# Patient Record
Sex: Male | Born: 1935 | Race: White | Hispanic: No | Marital: Single | Smoking: Former smoker
Health system: Southern US, Community
[De-identification: ages and names within clinical notes are randomized; demographics above are authoritative.]

## PROBLEM LIST (undated history)

## (undated) DIAGNOSIS — T7840XA Allergy, unspecified, initial encounter: Secondary | ICD-10-CM

## (undated) DIAGNOSIS — M40209 Unspecified kyphosis, site unspecified: Secondary | ICD-10-CM

## (undated) DIAGNOSIS — M4310 Spondylolisthesis, site unspecified: Secondary | ICD-10-CM

## (undated) DIAGNOSIS — F32A Depression, unspecified: Secondary | ICD-10-CM

## (undated) DIAGNOSIS — F329 Major depressive disorder, single episode, unspecified: Secondary | ICD-10-CM

## (undated) DIAGNOSIS — I10 Essential (primary) hypertension: Secondary | ICD-10-CM

## (undated) DIAGNOSIS — H9319 Tinnitus, unspecified ear: Secondary | ICD-10-CM

## (undated) DIAGNOSIS — E079 Disorder of thyroid, unspecified: Secondary | ICD-10-CM

## (undated) DIAGNOSIS — M1990 Unspecified osteoarthritis, unspecified site: Secondary | ICD-10-CM

## (undated) HISTORY — DX: Major depressive disorder, single episode, unspecified: F32.9

## (undated) HISTORY — DX: Unspecified kyphosis, site unspecified: M40.209

## (undated) HISTORY — DX: Unspecified osteoarthritis, unspecified site: M19.90

## (undated) HISTORY — PX: HERNIA REPAIR: SHX51

## (undated) HISTORY — DX: Spondylolisthesis, site unspecified: M43.10

## (undated) HISTORY — DX: Essential (primary) hypertension: I10

## (undated) HISTORY — DX: Allergy, unspecified, initial encounter: T78.40XA

## (undated) HISTORY — DX: Depression, unspecified: F32.A

## (undated) HISTORY — DX: Tinnitus, unspecified ear: H93.19

## (undated) HISTORY — DX: Disorder of thyroid, unspecified: E07.9

---

## 2004-03-09 ENCOUNTER — Encounter: Admission: RE | Admit: 2004-03-09 | Discharge: 2004-03-09 | Payer: Self-pay | Admitting: Family Medicine

## 2004-05-04 ENCOUNTER — Encounter: Admission: RE | Admit: 2004-05-04 | Discharge: 2004-05-04 | Payer: Self-pay | Admitting: Family Medicine

## 2004-05-06 ENCOUNTER — Encounter: Admission: RE | Admit: 2004-05-06 | Discharge: 2004-05-06 | Payer: Self-pay | Admitting: Family Medicine

## 2005-09-25 ENCOUNTER — Encounter: Admission: RE | Admit: 2005-09-25 | Discharge: 2005-09-25 | Payer: Self-pay | Admitting: Family Medicine

## 2012-01-21 ENCOUNTER — Other Ambulatory Visit: Payer: Self-pay | Admitting: Internal Medicine

## 2012-01-21 NOTE — Telephone Encounter (Signed)
PLEASE PULL CHART 

## 2012-01-22 ENCOUNTER — Other Ambulatory Visit: Payer: Self-pay | Admitting: Physician Assistant

## 2012-01-22 ENCOUNTER — Telehealth: Payer: Self-pay

## 2012-01-22 MED ORDER — TRAMADOL HCL 50 MG PO TABS: 50.0000 mg | ORAL_TABLET | Freq: Three times a day (TID) | ORAL | Status: AC | PRN

## 2012-01-22 NOTE — Telephone Encounter (Signed)
.  [facility]    PT'S WIFE STATES [facility])HAS FAXED OVER REQUEST X 2 FOR PT'S TRAMADOL, NO RESPONSE    BEST PHONE CELL # [phone]

## 2012-01-23 ENCOUNTER — Other Ambulatory Visit: Payer: Self-pay | Admitting: Internal Medicine

## 2012-01-23 NOTE — Telephone Encounter (Signed)
Med sent to pharmacy 2/12

## 2012-01-25 ENCOUNTER — Other Ambulatory Visit: Payer: Self-pay | Admitting: Physician Assistant

## 2012-01-25 MED ORDER — LIDOCAINE 5 % EX PTCH: 1.0000 | MEDICATED_PATCH | CUTANEOUS | Status: AC

## 2012-03-16 ENCOUNTER — Other Ambulatory Visit: Payer: Self-pay | Admitting: Internal Medicine

## 2012-04-09 ENCOUNTER — Other Ambulatory Visit: Payer: Self-pay | Admitting: Internal Medicine

## 2012-04-21 ENCOUNTER — Other Ambulatory Visit: Payer: Self-pay | Admitting: Internal Medicine

## 2012-06-16 ENCOUNTER — Encounter: Payer: Self-pay | Admitting: Internal Medicine

## 2012-06-16 ENCOUNTER — Other Ambulatory Visit: Payer: Self-pay | Admitting: Internal Medicine

## 2012-06-16 ENCOUNTER — Ambulatory Visit (INDEPENDENT_AMBULATORY_CARE_PROVIDER_SITE_OTHER): Payer: Medicare Other | Admitting: Internal Medicine

## 2012-06-16 VITALS — BP 142/82 | HR 61 | Temp 97.0°F | Resp 16 | Ht 71.0 in | Wt 176.7 lb

## 2012-06-16 DIAGNOSIS — Z79899 Other long term (current) drug therapy: Secondary | ICD-10-CM

## 2012-06-16 DIAGNOSIS — M549 Dorsalgia, unspecified: Secondary | ICD-10-CM

## 2012-06-16 DIAGNOSIS — I10 Essential (primary) hypertension: Secondary | ICD-10-CM

## 2012-06-16 DIAGNOSIS — E039 Hypothyroidism, unspecified: Secondary | ICD-10-CM | POA: Insufficient documentation

## 2012-06-16 DIAGNOSIS — Z0000 Encounter for general adult medical examination without abnormal findings: Secondary | ICD-10-CM

## 2012-06-16 DIAGNOSIS — M4000 Postural kyphosis, site unspecified: Secondary | ICD-10-CM

## 2012-06-16 DIAGNOSIS — M545 Low back pain: Secondary | ICD-10-CM

## 2012-06-16 LAB — POCT UA - MICROSCOPIC ONLY
Bacteria, U Microscopic: NEGATIVE
WBC, Ur, HPF, POC: NEGATIVE

## 2012-06-16 LAB — CBC WITH DIFFERENTIAL/PLATELET
Basophils Relative: 1 % (ref 0–1)
Eosinophils Absolute: 0.3 10*3/uL (ref 0.0–0.7)
Eosinophils Relative: 4 % (ref 0–5)
HCT: 39.8 % (ref 39.0–52.0)
Hemoglobin: 13.7 g/dL (ref 13.0–17.0)
Lymphocytes Relative: 26 % (ref 12–46)
Lymphs Abs: 1.8 10*3/uL (ref 0.7–4.0)
MCHC: 34.4 g/dL (ref 30.0–36.0)
MCV: 89.4 fL (ref 78.0–100.0)
Monocytes Absolute: 0.6 10*3/uL (ref 0.1–1.0)
Monocytes Relative: 9 % (ref 3–12)
Neutro Abs: 4.1 10*3/uL (ref 1.7–7.7)
Neutrophils Relative %: 60 % (ref 43–77)
Platelets: 201 10*3/uL (ref 150–400)
RBC: 4.45 MIL/uL (ref 4.22–5.81)
RDW: 13.7 % (ref 11.5–15.5)
WBC: 6.8 10*3/uL (ref 4.0–10.5)

## 2012-06-16 LAB — POCT URINALYSIS DIPSTICK
Bilirubin, UA: NEGATIVE
Blood, UA: NEGATIVE
Leukocytes, UA: NEGATIVE
Nitrite, UA: NEGATIVE
Protein, UA: NEGATIVE
Spec Grav, UA: >=1.03
Urobilinogen, UA: 0.2
pH, UA: 5

## 2012-06-16 MED ORDER — TRAMADOL HCL 50 MG PO TABS: 50.0000 mg | ORAL_TABLET | Freq: Three times a day (TID) | ORAL | Status: DC | PRN

## 2012-06-16 MED ORDER — LIDOCAINE 5 % EX PTCH: 1.0000 | MEDICATED_PATCH | CUTANEOUS | Status: AC

## 2012-06-16 NOTE — Progress Notes (Signed)
  Subjective:    Patient ID: Larry Cordova, male    DOB: 1930-06-27, 78 y.o.   MRN: [id number]  HPI 78 y/o in overall decent health here for cpe and f/up htn, low thyroid, and chronic back pain from marked kyphosis. See hx form scanned   Review of Systems See ros scanned    Objective:   Physical Exam Severe kyphosis, walks with a cane, moves easily, talkative Hearing good Heart and lungs nl Prostate not enlged or tender Neuro ok for age.  ekg nl Results for orders placed in visit on 06/16/12  POCT UA - MICROSCOPIC ONLY      Component Value Range   WBC, Ur, HPF, POC neg     RBC, urine, microscopic 0-1     Bacteria, U Microscopic neg     Mucus, UA trace     Epithelial cells, urine per micros 0-2     Crystals, Ur, HPF, POC neg     Casts, Ur, LPF, POC neg     Yeast, UA neg    POCT URINALYSIS DIPSTICK      Component Value Range   Color, UA yellow     Clarity, UA clear     Glucose, UA neg     Bilirubin, UA neg     Ketones, UA trace     Spec Grav, UA >=1.030     Blood, UA neg     pH, UA 5.0     Protein, UA neg     Urobilinogen, UA 0.2     Nitrite, UA neg     Leukocytes, UA Negative          Assessment & Plan:  RF meds 1 year RTC 6 mos

## 2012-06-16 NOTE — Patient Instructions (Addendum)
Chronic Back Pain When back pain lasts longer than 3 months, it is called chronic back pain.This pain can be frustrating, but the cause of the pain is rarely dangerous.People with chronic back pain often go through certain periods that are more intense (flare-ups). CAUSES Chronic back pain can be caused by wear and tear (degeneration) on different structures in your back. These structures may include bones, ligaments, or discs. This degeneration may result in more pressure being placed on the nerves that travel to your legs and feet. This can lead to pain traveling from the low back down the back of the legs. When pain lasts longer than 3 months, it is not unusual for people to experience anxiety or depression. Anxiety and depression can also contribute to low back pain. TREATMENT  Establish a regular exercise plan. This is critical to improving your functional level.   Have a self-management plan for when you flare-up. Flare-ups rarely require a medical visit. Regular exercise will help reduce the intensity and frequency of your flare-ups.   Manage how you feel about your back pain and the rest of your life. Anxiety, depression, and feeling that you cannot alter your back pain have been shown to make back pain more intense and debilitating.   Medicines should never be your only treatment. They should be used along with other treatments to help you return to a more active lifestyle.   Procedures such as injections or surgery may be helpful but are rarely necessary. You may be able to get the same results with physical therapy or chiropractic care.  HOME CARE INSTRUCTIONS  Avoid bending, heavy lifting, prolonged sitting, and activities which make the problem worse.   Continue normal activity as much as possible.   Take brief periods of rest throughout the day to reduce your pain during flare-ups.   Follow your back exercise rehabilitation program. This can help reduce symptoms and prevent  more pain.   Only take over-the-counter or prescription medicines as directed by your caregiver. Muscle relaxants are sometimes prescribed. Narcotic pain medicine is discouraged for long-term pain, since addiction is a possible outcome.   If you smoke, quit.   Eat healthy foods and maintain a recommended body weight.  SEEK IMMEDIATE MEDICAL CARE IF:   You have weakness or numbness in one of your legs or feet.   You have trouble controlling your bladder or bowels.   You develop nausea, vomiting, abdominal pain, shortness of breath, or fainting.  Document Released: 01/03/2005 Document Revised: 11/15/2011 Document Reviewed: 11/10/2011 [employer] Patient Information 2012 [employer]. 

## 2012-06-17 ENCOUNTER — Encounter: Payer: Self-pay | Admitting: Radiology

## 2012-06-17 LAB — TSH: TSH: 17.693 u[IU]/mL — ABNORMAL HIGH (ref 0.350–4.500)

## 2012-06-17 LAB — COMPREHENSIVE METABOLIC PANEL
ALT: 21 U/L (ref 0–53)
AST: 34 U/L (ref 0–37)
Albumin: 3.8 g/dL (ref 3.5–5.2)
Alkaline Phosphatase: 49 U/L (ref 39–117)
BUN: 22 mg/dL (ref 6–23)
CO2: 25 mEq/L (ref 19–32)
Calcium: 9.3 mg/dL (ref 8.4–10.5)
Chloride: 108 mEq/L (ref 96–112)
Potassium: 4.3 mEq/L (ref 3.5–5.3)
Sodium: 144 mEq/L (ref 135–145)
Total Bilirubin: 0.4 mg/dL (ref 0.3–1.2)
Total Protein: 6.8 g/dL (ref 6.0–8.3)

## 2012-06-17 LAB — LIPID PANEL
LDL Cholesterol: 94 mg/dL (ref 0–99)
Triglycerides: 166 mg/dL — ABNORMAL HIGH (ref <150)
VLDL: 33 mg/dL (ref 0–40)

## 2012-06-17 LAB — PSA, MEDICARE: PSA: 4.2 ng/mL — ABNORMAL HIGH (ref <=4.00)

## 2012-06-19 ENCOUNTER — Other Ambulatory Visit: Payer: Self-pay | Admitting: Internal Medicine

## 2012-07-08 LAB — IFOBT (OCCULT BLOOD): IFOBT: NEGATIVE

## 2012-10-20 ENCOUNTER — Other Ambulatory Visit: Payer: Self-pay | Admitting: Internal Medicine

## 2012-12-05 ENCOUNTER — Other Ambulatory Visit: Payer: Self-pay | Admitting: Physician Assistant

## 2012-12-22 ENCOUNTER — Ambulatory Visit: Payer: Medicare Other | Admitting: Internal Medicine

## 2013-01-24 ENCOUNTER — Other Ambulatory Visit: Payer: Self-pay | Admitting: Physician Assistant

## 2013-05-26 ENCOUNTER — Other Ambulatory Visit: Payer: Self-pay | Admitting: Physician Assistant

## 2013-06-25 ENCOUNTER — Other Ambulatory Visit: Payer: Self-pay | Admitting: Physician Assistant

## 2013-06-25 NOTE — Telephone Encounter (Signed)
meds sent until patient can come in

## 2013-07-06 ENCOUNTER — Encounter: Payer: Medicare Other | Admitting: Internal Medicine

## 2013-07-13 ENCOUNTER — Encounter: Payer: Self-pay | Admitting: Internal Medicine

## 2013-07-13 ENCOUNTER — Ambulatory Visit (INDEPENDENT_AMBULATORY_CARE_PROVIDER_SITE_OTHER): Payer: Medicare Other | Admitting: Internal Medicine

## 2013-07-13 VITALS — BP 110/76 | HR 61 | Temp 97.8°F | Resp 16 | Ht 71.0 in | Wt 168.0 lb

## 2013-07-13 DIAGNOSIS — M129 Arthropathy, unspecified: Secondary | ICD-10-CM

## 2013-07-13 DIAGNOSIS — Z0000 Encounter for general adult medical examination without abnormal findings: Secondary | ICD-10-CM

## 2013-07-13 DIAGNOSIS — M4000 Postural kyphosis, site unspecified: Secondary | ICD-10-CM

## 2013-07-13 DIAGNOSIS — M1990 Unspecified osteoarthritis, unspecified site: Secondary | ICD-10-CM | POA: Insufficient documentation

## 2013-07-13 DIAGNOSIS — I10 Essential (primary) hypertension: Secondary | ICD-10-CM

## 2013-07-13 DIAGNOSIS — Z79899 Other long term (current) drug therapy: Secondary | ICD-10-CM

## 2013-07-13 DIAGNOSIS — E039 Hypothyroidism, unspecified: Secondary | ICD-10-CM

## 2013-07-13 DIAGNOSIS — Z125 Encounter for screening for malignant neoplasm of prostate: Secondary | ICD-10-CM

## 2013-07-13 LAB — COMPREHENSIVE METABOLIC PANEL
ALT: 13 U/L (ref 0–53)
Albumin: 3.9 g/dL (ref 3.5–5.2)
Alkaline Phosphatase: 58 U/L (ref 39–117)
BUN: 12 mg/dL (ref 6–23)
CO2: 31 mEq/L (ref 19–32)
Calcium: 9.3 mg/dL (ref 8.4–10.5)
Chloride: 104 mEq/L (ref 96–112)
Creat: 0.99 mg/dL (ref 0.50–1.35)
Sodium: 140 mEq/L (ref 135–145)
Total Protein: 6.6 g/dL (ref 6.0–8.3)

## 2013-07-13 LAB — LIPID PANEL
Cholesterol: 178 mg/dL (ref 0–200)
HDL: 46 mg/dL (ref >39)
LDL Cholesterol: 108 mg/dL — ABNORMAL HIGH (ref 0–99)
Total CHOL/HDL Ratio: 3.9 Ratio
Triglycerides: 122 mg/dL (ref <150)
VLDL: 24 mg/dL (ref 0–40)

## 2013-07-13 LAB — POCT UA - MICROSCOPIC ONLY
Bacteria, U Microscopic: NEGATIVE
Mucus, UA: NEGATIVE
WBC, Ur, HPF, POC: NEGATIVE
Yeast, UA: NEGATIVE

## 2013-07-13 LAB — POCT URINALYSIS DIPSTICK
Bilirubin, UA: NEGATIVE
Ketones, UA: NEGATIVE
Leukocytes, UA: NEGATIVE
Nitrite, UA: NEGATIVE
Protein, UA: NEGATIVE
Spec Grav, UA: 1.02
pH, UA: 5.5

## 2013-07-13 LAB — PSA, MEDICARE: PSA: 3.61 ng/mL (ref <=4.00)

## 2013-07-13 LAB — TSH: TSH: 3.1 u[IU]/mL (ref 0.350–4.500)

## 2013-07-13 NOTE — Progress Notes (Signed)
  Subjective:    Patient ID: Larry Cordova, male    DOB: [birth year], 79 y.o.   MRN: [id number]  HPI    Review of Systems  HENT: Positive for tinnitus.   Musculoskeletal: Positive for back pain and arthralgias.       Objective:   Physical Exam        Assessment & Plan:

## 2013-07-13 NOTE — Patient Instructions (Addendum)
Back pain Back Pain, Adult Low back pain is very common. About 1 in 5 people have back pain.The cause of low back pain is rarely dangerous. The pain often gets better over time.About half of people with a sudden onset of back pain feel better in just 2 weeks. About 8 in 10 people feel better by 6 weeks.  CAUSES Some common causes of back pain include:  Strain of the muscles or ligaments supporting the spine.  Wear and tear (degeneration) of the spinal discs.  Arthritis.  Direct injury to the back. DIAGNOSIS Most of the time, the direct cause of low back pain is not known.However, back pain can be treated effectively even when the exact cause of the pain is unknown.Answering your caregiver's questions about your overall health and symptoms is one of the most accurate ways to make sure the cause of your pain is not dangerous. If your caregiver needs more information, he or she may order lab work or imaging tests (X-rays or MRIs).However, even if imaging tests show changes in your back, this usually does not require surgery. HOME CARE INSTRUCTIONS For many people, back pain returns.Since low back pain is rarely dangerous, it is often a condition that people can learn to [facility] their own.   Remain active. It is stressful on the back to sit or stand in one place. Do not sit, drive, or stand in one place for more than 30 minutes at a time. Take short walks on level surfaces as soon as pain allows.Try to increase the length of time you walk each day.  Do not stay in bed.Resting more than 1 or 2 days can delay your recovery.  Do not avoid exercise or work.Your body is made to move.It is not dangerous to be active, even though your back may hurt.Your back will likely heal faster if you return to being active before your pain is gone.  Pay attention to your body when you bend and lift. Many people have less discomfortwhen lifting if they bend their knees, keep the load close to their  bodies,and avoid twisting. Often, the most comfortable positions are those that put less stress on your recovering back.  Find a comfortable position to sleep. Use a firm mattress and lie on your side with your knees slightly bent. If you lie on your back, put a pillow under your knees.  Only take over-the-counter or prescription medicines as directed by your caregiver. Over-the-counter medicines to reduce pain and inflammation are often the most helpful.Your caregiver may prescribe muscle relaxant drugs.These medicines help dull your pain so you can more quickly return to your normal activities and healthy exercise.  Put ice on the injured area.  Put ice in a plastic bag.  Place a towel between your skin and the bag.  Leave the ice on for 15-20 minutes, 3-4 times a day for the first 2 to 3 days. After that, ice and heat may be alternated to reduce pain and spasms.  Ask your caregiver about trying back exercises and gentle massage. This may be of some benefit.  Avoid feeling anxious or stressed.Stress increases muscle tension and can worsen back pain.It is important to recognize when you are anxious or stressed and learn ways to manage it.Exercise is a great option. SEEK MEDICAL CARE IF:  You have pain that is not relieved with rest or medicine.  You have pain that does not improve in 1 week.  You have new symptoms.  You are generally not feeling  well. SEEK IMMEDIATE MEDICAL CARE IF:   You have pain that radiates from your back into your legs.  You develop new bowel or bladder control problems.  You have unusual weakness or numbness in your arms or legs.  You develop nausea or vomiting.  You develop abdominal pain.  You feel faint. Document Released: 11/26/2005 Document Revised: 05/27/2012 Document Reviewed: 04/16/2011 [facility] Patient Information 2014 [place], [place].

## 2013-07-13 NOTE — Progress Notes (Signed)
  Subjective:    Patient ID: Larry Cordova, male    DOB: 1930/09/01, 79 y.o.   MRN: [id number]  HPI Doing well for him.Has severe kyphosis and chronic back pain but keeps moving. HTN and thyroid controlled and meds tolerated.   Review of Systems  Constitutional: Negative.   HENT: Positive for hearing loss and tinnitus.   Eyes: Negative.   Respiratory: Negative.   Cardiovascular: Negative.   Gastrointestinal: Negative.   Endocrine: Negative.   Genitourinary: Negative.   Musculoskeletal: Positive for back pain, arthralgias and gait problem.  Skin: Negative.   Neurological: Negative.   Psychiatric/Behavioral: Negative.        Objective:   Physical Exam  Vitals reviewed. Constitutional: He is oriented to person, place, and time. He appears well-developed and well-nourished. No distress.  HENT:  Right Ear: External ear normal.  Left Ear: External ear normal.  Nose: Nose normal.  Mouth/Throat: Oropharynx is clear and moist.  Eyes: Conjunctivae and EOM are normal. Pupils are equal, round, and reactive to light.  Neck: Normal range of motion. Neck supple. No tracheal deviation present. No thyromegaly present.  Cardiovascular: Normal rate, regular rhythm, normal heart sounds and intact distal pulses.   Pulmonary/Chest: Effort normal and breath sounds normal.  Abdominal: Soft. Bowel sounds are normal. There is no tenderness.  Genitourinary: Rectum normal, prostate normal and penis normal.  Musculoskeletal: He exhibits tenderness.       Thoracic back: He exhibits decreased range of motion, tenderness, bony tenderness, deformity, pain and spasm. He exhibits no swelling, no edema, no laceration and normal pulse.  Lymphadenopathy:    He has no cervical adenopathy.  Neurological: He is alert and oriented to person, place, and time. He has normal strength and normal reflexes. No cranial nerve deficit or sensory deficit. He exhibits normal muscle tone. Coordination and gait abnormal.   Skin: No rash noted.   Results for orders placed in visit on 07/13/13  POCT URINALYSIS DIPSTICK      Result Value Range   Color, UA yellow     Clarity, UA clear     Glucose, UA neg     Bilirubin, UA neg     Ketones, UA neg     Spec Grav, UA 1.020     Blood, UA neg     pH, UA 5.5     Protein, UA neg     Urobilinogen, UA 0.2     Nitrite, UA neg     Leukocytes, UA Negative    POCT UA - MICROSCOPIC ONLY      Result Value Range   WBC, Ur, HPF, POC neg     RBC, urine, microscopic 0-3     Bacteria, U Microscopic neg     Mucus, UA neg     Epithelial cells, urine per micros 0-2     Crystals, Ur, HPF, POC neg     Casts, Ur, LPF, POC neg     Yeast, UA neg     ekg normal       Assessment & Plan:  Refer to Spine Pain doctor/Dr. Regino Schultze Refill meds 1 year. Colonoscopy [place] call and be sure UTD

## 2013-07-23 LAB — IFOBT (OCCULT BLOOD): IFOBT: NEGATIVE

## 2013-08-20 ENCOUNTER — Other Ambulatory Visit: Payer: Self-pay | Admitting: Physician Assistant

## 2013-08-20 ENCOUNTER — Other Ambulatory Visit: Payer: Self-pay | Admitting: Internal Medicine

## 2013-08-24 ENCOUNTER — Other Ambulatory Visit: Payer: Self-pay | Admitting: Radiology

## 2013-08-24 NOTE — Telephone Encounter (Signed)
Faxed

## 2014-01-25 ENCOUNTER — Ambulatory Visit: Payer: Medicare Other | Admitting: Internal Medicine

## 2014-03-05 ENCOUNTER — Other Ambulatory Visit: Payer: Self-pay | Admitting: Internal Medicine

## 2014-03-08 NOTE — Telephone Encounter (Signed)
Pt has appt sch for CPE on 05/11/14.

## 2014-03-13 NOTE — Telephone Encounter (Signed)
rtc and see me

## 2014-03-15 NOTE — Telephone Encounter (Signed)
Dr Elder Cyphers handed me a note w/pt's name along w/med, Tramadol, and stated that he was sent back a message again to auth RF of Tramadol for pt and he stated that he has already OKd 1 RF. After reading this message, asked Guerry Bruin to verify I had heard Dr Elder Cyphers correctly and she verified that he advised that he authorized 1 RF of Tramadol for pt. I am calling in 1 RF verbal w/readback for Dr Elder Cyphers to sign.

## 2014-05-08 ENCOUNTER — Other Ambulatory Visit: Payer: Self-pay | Admitting: Internal Medicine

## 2014-05-11 ENCOUNTER — Ambulatory Visit (INDEPENDENT_AMBULATORY_CARE_PROVIDER_SITE_OTHER): Payer: Medicare Other | Admitting: Emergency Medicine

## 2014-05-11 ENCOUNTER — Encounter: Payer: Self-pay | Admitting: Emergency Medicine

## 2014-05-11 VITALS — BP 137/90 | HR 76 | Temp 97.8°F | Resp 16 | Ht 70.5 in | Wt 174.0 lb

## 2014-05-11 DIAGNOSIS — E039 Hypothyroidism, unspecified: Secondary | ICD-10-CM

## 2014-05-11 DIAGNOSIS — I10 Essential (primary) hypertension: Secondary | ICD-10-CM

## 2014-05-11 DIAGNOSIS — M1990 Unspecified osteoarthritis, unspecified site: Secondary | ICD-10-CM

## 2014-05-11 DIAGNOSIS — S41109A Unspecified open wound of unspecified upper arm, initial encounter: Secondary | ICD-10-CM

## 2014-05-11 LAB — CBC WITH DIFFERENTIAL/PLATELET
Basophils Absolute: 0.1 10*3/uL (ref 0.0–0.1)
Basophils Relative: 1 % (ref 0–1)
EOS PCT: 3 % (ref 0–5)
Eosinophils Absolute: 0.2 10*3/uL (ref 0.0–0.7)
HEMATOCRIT: 40.8 % (ref 39.0–52.0)
Hemoglobin: 14.1 g/dL (ref 13.0–17.0)
LYMPHS ABS: 2.2 10*3/uL (ref 0.7–4.0)
LYMPHS PCT: 40 % (ref 12–46)
MCH: 29.6 pg (ref 26.0–34.0)
MCHC: 34.6 g/dL (ref 30.0–36.0)
MCV: 85.7 fL (ref 78.0–100.0)
MONO ABS: 0.7 10*3/uL (ref 0.1–1.0)
Monocytes Relative: 13 % — ABNORMAL HIGH (ref 3–12)
Neutro Abs: 2.3 10*3/uL (ref 1.7–7.7)
Neutrophils Relative %: 43 % (ref 43–77)
Platelets: 236 10*3/uL (ref 150–400)
RBC: 4.76 MIL/uL (ref 4.22–5.81)
RDW: 14.3 % (ref 11.5–15.5)
WBC: 5.4 10*3/uL (ref 4.0–10.5)

## 2014-05-11 MED ORDER — LEVOTHYROXINE SODIUM 100 MCG PO TABS: 100.0000 ug | ORAL_TABLET | Freq: Every day | ORAL | Status: DC

## 2014-05-11 MED ORDER — LISINOPRIL 5 MG PO TABS: ORAL_TABLET | ORAL | Status: DC

## 2014-05-11 MED ORDER — MUPIROCIN 2 % EX OINT: TOPICAL_OINTMENT | CUTANEOUS | Status: DC

## 2014-05-11 MED ORDER — TRAMADOL HCL 50 MG PO TABS: ORAL_TABLET | ORAL | Status: DC

## 2014-05-11 NOTE — Progress Notes (Signed)
   Subjective:    Patient ID: Larry Cordova Traum, male    DOB: 01/30/1930, 80 y.o.   MRN: [id number]  HPI 80 year old patient of Dr. Elder Cyphers who presents for refill of his pain medications. He has significant spine disease. He takes Ultram one a day at the present time and does not get complete relief of his pain. He also has a nonhealing lesion over the left arm. There is a history of cancer in that area. It has been unable to heal for disease the year.    Review of Systems     Objective:   Physical Exam patient is alert and cooperative. Neck is supple. Chest reveals a significant extremity exam reveals a 2 x 2 cm ulcerated area on the left lateral arm with rolled margins.thoracic kyphosis with decreasI have increaed breath sounds in the bases. Heart regular rate no murmurs rubs or gallops appreciated. Abdomen soft liver spleen not enlarged no areas of tenderness.         Assessment & Plan:  Will increase pain medications. Recheck in 3 months. Referral made back to dermatology for treatment of his lesion of the arm. Labs were done a blood pressure medication refilled. I states he has had some memory "psychological issues recently and I advised her I would see him for a 30 minute appointment so we could address these problems

## 2014-05-12 ENCOUNTER — Encounter: Payer: Self-pay | Admitting: *Deleted

## 2014-05-12 LAB — COMPLETE METABOLIC PANEL WITH GFR
ALBUMIN: 3.5 g/dL (ref 3.5–5.2)
ALK PHOS: 67 U/L (ref 39–117)
ALT: 15 U/L (ref 0–53)
AST: 22 U/L (ref 0–37)
BUN: 11 mg/dL (ref 6–23)
CO2: 26 mEq/L (ref 19–32)
CREATININE: 0.84 mg/dL (ref 0.50–1.35)
Calcium: 9.1 mg/dL (ref 8.4–10.5)
Chloride: 102 mEq/L (ref 96–112)
GFR, EST NON AFRICAN AMERICAN: 82 mL/min
GFR, Est African American: >89 mL/min
GLUCOSE: 99 mg/dL (ref 70–99)
POTASSIUM: 4.3 meq/L (ref 3.5–5.3)
Sodium: 140 mEq/L (ref 135–145)
Total Bilirubin: 0.7 mg/dL (ref 0.2–1.2)
Total Protein: 6.4 g/dL (ref 6.0–8.3)

## 2014-05-12 LAB — TSH: TSH: 0.023 u[IU]/mL — ABNORMAL LOW (ref 0.350–4.500)

## 2014-05-12 LAB — T4, FREE: Free T4: 1.95 ng/dL — ABNORMAL HIGH (ref 0.80–1.80)

## 2014-05-14 ENCOUNTER — Other Ambulatory Visit: Payer: Self-pay

## 2014-05-14 LAB — WOUND CULTURE
GRAM STAIN: NONE SEEN
Gram Stain: NONE SEEN
Gram Stain: NONE SEEN

## 2014-05-14 MED ORDER — LEVOTHYROXINE SODIUM 75 MCG PO TABS: 75.0000 ug | ORAL_TABLET | Freq: Every day | ORAL | Status: DC

## 2014-05-16 ENCOUNTER — Other Ambulatory Visit: Payer: Self-pay | Admitting: *Deleted

## 2014-05-16 MED ORDER — CEPHALEXIN 500 MG PO CAPS: 500.0000 mg | ORAL_CAPSULE | Freq: Two times a day (BID) | ORAL | Status: DC

## 2014-07-29 ENCOUNTER — Encounter: Payer: Self-pay | Admitting: Emergency Medicine

## 2014-07-29 ENCOUNTER — Ambulatory Visit (INDEPENDENT_AMBULATORY_CARE_PROVIDER_SITE_OTHER): Payer: Medicare Other | Admitting: Emergency Medicine

## 2014-07-29 VITALS — BP 120/82 | HR 69 | Temp 97.8°F | Resp 16 | Wt 170.4 lb

## 2014-07-29 DIAGNOSIS — E785 Hyperlipidemia, unspecified: Secondary | ICD-10-CM

## 2014-07-29 DIAGNOSIS — E039 Hypothyroidism, unspecified: Secondary | ICD-10-CM

## 2014-07-29 DIAGNOSIS — Z125 Encounter for screening for malignant neoplasm of prostate: Secondary | ICD-10-CM

## 2014-07-29 DIAGNOSIS — C4490 Unspecified malignant neoplasm of skin, unspecified: Secondary | ICD-10-CM | POA: Insufficient documentation

## 2014-07-29 DIAGNOSIS — Z1211 Encounter for screening for malignant neoplasm of colon: Secondary | ICD-10-CM

## 2014-07-29 DIAGNOSIS — Z23 Encounter for immunization: Secondary | ICD-10-CM

## 2014-07-29 DIAGNOSIS — M1990 Unspecified osteoarthritis, unspecified site: Secondary | ICD-10-CM

## 2014-07-29 DIAGNOSIS — Z0000 Encounter for general adult medical examination without abnormal findings: Secondary | ICD-10-CM

## 2014-07-29 DIAGNOSIS — I10 Essential (primary) hypertension: Secondary | ICD-10-CM

## 2014-07-29 DIAGNOSIS — M129 Arthropathy, unspecified: Secondary | ICD-10-CM

## 2014-07-29 LAB — TSH: TSH: 13.421 u[IU]/mL — ABNORMAL HIGH (ref 0.350–4.500)

## 2014-07-29 LAB — COMPREHENSIVE METABOLIC PANEL
ALBUMIN: 3.9 g/dL (ref 3.5–5.2)
ALT: 14 U/L (ref 0–53)
AST: 20 U/L (ref 0–37)
Alkaline Phosphatase: 62 U/L (ref 39–117)
BUN: 12 mg/dL (ref 6–23)
CALCIUM: 9.2 mg/dL (ref 8.4–10.5)
CHLORIDE: 102 meq/L (ref 96–112)
CO2: 29 meq/L (ref 19–32)
Creat: 0.94 mg/dL (ref 0.50–1.35)
Glucose, Bld: 96 mg/dL (ref 70–99)
POTASSIUM: 4.2 meq/L (ref 3.5–5.3)
Sodium: 138 mEq/L (ref 135–145)
Total Bilirubin: 0.7 mg/dL (ref 0.2–1.2)
Total Protein: 7.1 g/dL (ref 6.0–8.3)

## 2014-07-29 LAB — CBC
HEMATOCRIT: 45.2 % (ref 39.0–52.0)
Hemoglobin: 15.4 g/dL (ref 13.0–17.0)
MCH: 29.8 pg (ref 26.0–34.0)
MCHC: 34.1 g/dL (ref 30.0–36.0)
MCV: 87.4 fL (ref 78.0–100.0)
Platelets: 205 10*3/uL (ref 150–400)
RBC: 5.17 MIL/uL (ref 4.22–5.81)
RDW: 14.4 % (ref 11.5–15.5)
WBC: 7.3 10*3/uL (ref 4.0–10.5)

## 2014-07-29 LAB — LIPID PANEL
CHOLESTEROL: 179 mg/dL (ref 0–200)
HDL: 50 mg/dL (ref >39)
LDL Cholesterol: 111 mg/dL — ABNORMAL HIGH (ref 0–99)
TRIGLYCERIDES: 90 mg/dL (ref <150)
Total CHOL/HDL Ratio: 3.6 Ratio
VLDL: 18 mg/dL (ref 0–40)

## 2014-07-29 LAB — POCT URINALYSIS DIPSTICK
BILIRUBIN UA: NEGATIVE
Blood, UA: NEGATIVE
Glucose, UA: NEGATIVE
KETONES UA: NEGATIVE
Leukocytes, UA: NEGATIVE
Nitrite, UA: NEGATIVE
PH UA: 6
Protein, UA: NEGATIVE
Spec Grav, UA: 1.015
Urobilinogen, UA: 1

## 2014-07-29 LAB — IFOBT (OCCULT BLOOD): IMMUNOLOGICAL FECAL OCCULT BLOOD TEST: NEGATIVE

## 2014-07-29 LAB — T4, FREE: Free T4: 1.02 ng/dL (ref 0.80–1.80)

## 2014-07-29 NOTE — Progress Notes (Addendum)
Subjective:  This chart was scribed for Larry Queen, MD by Donato Schultz, Medical Scribe. This patient was seen in Room 21 and the patient's care was started at 8:34 AM.   Patient ID: Larry Cordova, male    DOB: Dec 02, 1930, 80 y.o.   MRN: [id number]  HPI HPI Comments: Larry Cordova is a 80 y.o. male who presents to the [facility] for an annual exam.  He has been doing well despite having arthritic and back pain where his spine curves.  He does not see an orthopedist.  He uses his cane when walking to balance himself.  He denies chest pain and SOB as associated symptoms.  He is not complaining of worsening hearing and has no desire to see an audiologist.   He does not see the eye doctor annually but has seen Dr. Gershon Crane in the past.  He wears glasses daily.  He has never received a colonoscopy but would like to make an appointment.  He does not see a dermatologist.  He is taking 75MCG Levothyroxine daily.    Past Medical History  Diagnosis Date  . Hypertension   . Thyroid disease   . Allergy   . Arthritis   . Tinnitus   . Kyphosis    Past Surgical History  Procedure Laterality Date  . Hernia repair     Family History  Problem Relation Age of Onset  . Kidney failure Mother    History   Social History  . Marital Status: Single    Spouse Name: N/A    Number of Children: N/A  . Years of Education: N/A   Occupational History  . Not on file.   Social History Main Topics  . Smoking status: Former Smoker -- 30 years    Types: Cigarettes, Pipe, Cigars  . Smokeless tobacco: Not on file  . Alcohol Use: No  . Drug Use: No  . Sexual Activity: Yes   Other Topics Concern  . Not on file   Social History Narrative  . No narrative on file   No Known Allergies  Review of Systems  HENT: Negative for hearing loss.   Respiratory: Negative for shortness of breath.   Cardiovascular: Negative for chest pain.  Musculoskeletal: Positive for arthralgias, back  pain and gait problem.  All other systems reviewed and are negative.    Objective:  Physical Exam  Nursing note and vitals reviewed. Constitutional: He is oriented to person, place, and time. He appears well-developed and well-nourished.  Elderly, cooperative, and hard of hearing.  HENT:  Head: Normocephalic and atraumatic.  Right Ear: External ear normal.  Left Ear: External ear normal.  Nose: Nose normal.  Mouth/Throat: Oropharynx is clear and moist. No oropharyngeal exudate.  Traumatic area to the entrance to his right ear - 0.5x0.5 cm.  Upper and lower partial plates in his mouth.    Eyes: Conjunctivae and EOM are normal. Pupils are equal, round, and reactive to light.  Neck: Normal range of motion. Neck supple. No thyromegaly present.  Cardiovascular: Normal rate, regular rhythm and normal heart sounds.  Exam reveals no gallop and no friction rub.   No murmur heard.  DP pulses are 1+.    Pulmonary/Chest: Effort normal. No respiratory distress. He has no wheezes. He has rales.  Dry rales in both bases.    Abdominal: Soft. Bowel sounds are normal. There is no tenderness.  Genitourinary:  Prostate was symmetrically enlarged without masses.   Musculoskeletal: Normal range  of motion.  Severe arthritic changes of the thoracic and lower lumbar spine with a pectus deformity of his chest.  Lymphadenopathy:    He has no cervical adenopathy.  Neurological: He is alert and oriented to person, place, and time.  Skin: Skin is warm and dry.  Bilateral potomelial fungus.   Psychiatric: He has a normal mood and affect. His behavior is normal.   patient has any healing incision over his left arm where he had removal of a skin cancer  There were no vitals taken for this visit. Assessment & Plan:  Routine labs were done today. Patient continues to live at home with his wife. They do get some help from her daughter who is a Marine scientist. He continues to walk with a cane due to his severe back  disease. This has been stable. Routine referrals were made to GI and also to ophthalmology. His immunizations were updated and patient was given Prevnar and flu vaccine.  I personally performed the services described in this documentation, which was scribed in my presence. The recorded information has been reviewed and is accurate.

## 2014-07-30 ENCOUNTER — Other Ambulatory Visit: Payer: Self-pay | Admitting: Family Medicine

## 2014-07-30 DIAGNOSIS — E039 Hypothyroidism, unspecified: Secondary | ICD-10-CM

## 2014-07-30 LAB — PSA, MEDICARE: PSA: 4.18 ng/mL — AB (ref <=4.00)

## 2014-07-30 MED ORDER — LEVOTHYROXINE SODIUM 100 MCG PO TABS: 100.0000 ug | ORAL_TABLET | Freq: Every day | ORAL | Status: DC

## 2014-07-30 NOTE — Telephone Encounter (Signed)
See lab result. Sent in new RX synthroid 100 mcg 1 poqd

## 2014-09-06 ENCOUNTER — Encounter: Payer: Self-pay | Admitting: Emergency Medicine

## 2014-12-02 ENCOUNTER — Other Ambulatory Visit: Payer: Self-pay | Admitting: Emergency Medicine

## 2015-02-07 ENCOUNTER — Encounter: Payer: Self-pay | Admitting: *Deleted

## 2015-07-09 ENCOUNTER — Other Ambulatory Visit: Payer: Self-pay | Admitting: Emergency Medicine

## 2015-08-16 ENCOUNTER — Other Ambulatory Visit: Payer: Self-pay | Admitting: Emergency Medicine

## 2015-08-17 NOTE — Telephone Encounter (Signed)
Faxed

## 2015-08-18 ENCOUNTER — Encounter: Payer: Self-pay | Admitting: Emergency Medicine

## 2015-11-22 ENCOUNTER — Ambulatory Visit (INDEPENDENT_AMBULATORY_CARE_PROVIDER_SITE_OTHER): Payer: Medicare Other | Admitting: Internal Medicine

## 2015-11-22 VITALS — BP 114/80 | HR 76 | Temp 98.1°F | Resp 16 | Wt 174.0 lb

## 2015-11-22 DIAGNOSIS — M1990 Unspecified osteoarthritis, unspecified site: Secondary | ICD-10-CM

## 2015-11-22 DIAGNOSIS — E038 Other specified hypothyroidism: Secondary | ICD-10-CM | POA: Diagnosis not present

## 2015-11-22 DIAGNOSIS — Z23 Encounter for immunization: Secondary | ICD-10-CM

## 2015-11-22 DIAGNOSIS — I10 Essential (primary) hypertension: Secondary | ICD-10-CM

## 2015-11-22 MED ORDER — TRAMADOL HCL 50 MG PO TABS: ORAL_TABLET | ORAL | Status: DC

## 2015-11-22 MED ORDER — LEVOTHYROXINE SODIUM 100 MCG PO TABS: 100.0000 ug | ORAL_TABLET | Freq: Every day | ORAL | Status: DC

## 2015-11-22 NOTE — Progress Notes (Signed)
Subjective:  This chart was scribed for Tami Lin, MD by Moises Blood, Medical Scribe. This patient was seen in Room 12 and the patient's care was started at 4:23 PM.    Patient ID: Larry Cordova, male    DOB: 1930-05-10, 81 y.o.   MRN: [id number] Chief Complaint  Patient presents with  . Medication Refill  . lump    left ear   HPI Larry Cordova is a 81 y.o. male who presents to [facility] for medication refill. PCP was Dr Elder Cyphers He's feeling generally well. He reports that he hasn't fallen in the last year. He uses a cane for mobility.   Medication He's been out of medication for a few months. His last visit was an annual physical with Dr. Everlene Farrier back in August 2015.   Arthritis He has arthritis in his back, hips and knees. He takes tramadol for this issue.   Immunizations He was recommended for a flu shot and received one today.   Patient Active Problem List   Diagnosis Date Noted  . Skin cancer 07/29/2014  . Other and unspecified hyperlipidemia 07/29/2014  . Arthritis 07/13/2013  . Hypothyroid 06/16/2012  . HTN (hypertension) 06/16/2012    Current outpatient prescriptions:  .  aspirin 81 MG tablet, Take 81 mg by mouth daily., Disp: , Rfl:  .  levothyroxine (SYNTHROID, LEVOTHROID) 100 MCG tablet, Take 1 tablet (100 mcg total) by mouth daily., Disp: 30 tablet, Rfl: 6 .  lisinopril (PRINIVIL,ZESTRIL) 5 MG tablet, TAKE 1 TABLET BY MOUTH ONCE DAILY, Disp: 90 tablet, Rfl: 0 .  Multiple Vitamin (MULTIVITAMIN) tablet, Take 1 tablet by mouth daily., Disp: , Rfl:  .  mupirocin ointment (BACTROBAN) 2 %, Applied to left arm twice a day., Disp: 22 g, Rfl: 0 .  traMADol (ULTRAM) 50 MG tablet, take 1 tablet by mouth every 8 hours if needed for pain, Disp: 60 tablet, Rfl: 2  Review of Systems  Constitutional: Negative for fatigue and unexpected weight change.  Eyes: Negative for visual disturbance.  Respiratory: Negative for cough, chest tightness and shortness of breath.     Cardiovascular: Negative for chest pain, palpitations and leg swelling.  Gastrointestinal: Negative for abdominal pain and blood in stool.  Musculoskeletal: Positive for back pain and arthralgias.  Neurological: Negative for dizziness, light-headedness and headaches.   HM--needs flu shot    Objective:   Physical Exam  Constitutional: He is oriented to person, place, and time. He appears well-developed and well-nourished. No distress.  HENT:  Head: Normocephalic and atraumatic.  Eyes: EOM are normal. Pupils are equal, round, and reactive to light.  Neck: Neck supple.  Cardiovascular: Normal rate.   Pulmonary/Chest: Effort normal. No respiratory distress.  Musculoskeletal: Normal range of motion.  Neurological: He is alert and oriented to person, place, and time.  Skin: Skin is warm and dry.  Psychiatric: He has a normal mood and affect. His behavior is normal.  Nursing note and vitals reviewed.  BP 114/80 mmHg  Pulse 76  Temp(Src) 98.1 F (36.7 C) (Oral)  Resp 16  Wt 174 lb (78.926 kg)  SpO2 97%     Assessment & Plan:  Essential hypertension ---off meds 2 mos and systolic [id number] tonight--will stop lisin 5  Other specified hypothyroidism - - Plan: levothyroxine (SYNTHROID, LEVOTHROID) 100 MCG tablet restarted and labs in 3 mos  Flu vaccine need - Plan: Flu Vaccine QUAD 36+ mos IM  Arthritis  --pl-ref tram  Meds ordered this encounter  Medications  .  levothyroxine (SYNTHROID, LEVOTHROID) 100 MCG tablet    Sig: Take 1 tablet (100 mcg total) by mouth daily.    Dispense:  90 tablet    Refill:  0  . traMADol (ULTRAM) 50 MG tablet    Sig: take 1 tablet by mouth every 8 hours if needed for pain    Dispense:  60 tablet    Refill:  2   Needs transfer dr guest to new provider in 3 mos for annual med well plus labs   By signing my name below, I, Moises Blood, attest that this documentation has been prepared under the direction and in the presence of Tami Lin,  MD. Electronically Signed: Moises Blood, [place]. 11/22/2015 , 4:26 PM .  I have completed the patient encounter in its entirety as documented by the scribe, with editing by me where necessary. Harvey Matlack P. Laney Pastor, M.D.

## 2016-02-20 ENCOUNTER — Other Ambulatory Visit: Payer: Self-pay | Admitting: Emergency Medicine

## 2016-02-22 NOTE — Telephone Encounter (Signed)
Needs f/u as noted in last ov and transition to new provider since dr guest is unavailable

## 2016-02-27 ENCOUNTER — Other Ambulatory Visit: Payer: Self-pay

## 2016-02-27 DIAGNOSIS — E038 Other specified hypothyroidism: Secondary | ICD-10-CM

## 2016-02-27 MED ORDER — LEVOTHYROXINE SODIUM 100 MCG PO TABS: 100.0000 ug | ORAL_TABLET | Freq: Every day | ORAL | Status: DC

## 2016-09-13 ENCOUNTER — Encounter: Payer: Medicare Other | Admitting: Family Medicine

## 2016-10-25 ENCOUNTER — Encounter: Payer: Medicare Other | Admitting: Family Medicine

## 2016-12-07 ENCOUNTER — Ambulatory Visit (INDEPENDENT_AMBULATORY_CARE_PROVIDER_SITE_OTHER): Payer: Medicare Other

## 2016-12-07 ENCOUNTER — Ambulatory Visit (INDEPENDENT_AMBULATORY_CARE_PROVIDER_SITE_OTHER): Payer: Medicare Other | Admitting: Physician Assistant

## 2016-12-07 VITALS — BP 142/98 | HR 66 | Temp 97.5°F | Resp 18 | Ht 70.5 in | Wt 178.0 lb

## 2016-12-07 DIAGNOSIS — R946 Abnormal results of thyroid function studies: Secondary | ICD-10-CM

## 2016-12-07 DIAGNOSIS — R7989 Other specified abnormal findings of blood chemistry: Secondary | ICD-10-CM

## 2016-12-07 DIAGNOSIS — R4189 Other symptoms and signs involving cognitive functions and awareness: Secondary | ICD-10-CM

## 2016-12-07 DIAGNOSIS — E038 Other specified hypothyroidism: Secondary | ICD-10-CM

## 2016-12-07 DIAGNOSIS — R404 Transient alteration of awareness: Secondary | ICD-10-CM | POA: Diagnosis not present

## 2016-12-07 DIAGNOSIS — I517 Cardiomegaly: Secondary | ICD-10-CM

## 2016-12-07 DIAGNOSIS — I10 Essential (primary) hypertension: Secondary | ICD-10-CM | POA: Diagnosis not present

## 2016-12-07 LAB — POCT CBC
Granulocyte percent: 65.5 %G (ref 37–80)
HEMATOCRIT: 43.7 % (ref 43.5–53.7)
Hemoglobin: 15.4 g/dL (ref 14.1–18.1)
LYMPH, POC: 2.5 (ref 0.6–3.4)
MCH, POC: 31.1 pg (ref 27–31.2)
MCHC: 35.1 g/dL (ref 31.8–35.4)
MCV: 88.7 fL (ref 80–97)
MID (CBC): 0.5 (ref 0–0.9)
MPV: 8.2 fL (ref 0–99.8)
POC GRANULOCYTE: 5.8 (ref 2–6.9)
POC LYMPH %: 28.8 % (ref 10–50)
POC MID %: 5.7 % (ref 0–12)
Platelet Count, POC: 180 10*3/uL (ref 142–424)
RBC: 4.93 M/uL (ref 4.69–6.13)
RDW, POC: 13.9 %
WBC: 8.8 10*3/uL (ref 4.6–10.2)

## 2016-12-07 LAB — POCT URINALYSIS DIP (MANUAL ENTRY)
BILIRUBIN UA: NEGATIVE
Blood, UA: NEGATIVE
GLUCOSE UA: NEGATIVE
Ketones, POC UA: NEGATIVE
Leukocytes, UA: NEGATIVE
NITRITE UA: NEGATIVE
PH UA: 5
Protein Ur, POC: NEGATIVE
Spec Grav, UA: 1.01
Urobilinogen, UA: 0.2

## 2016-12-07 LAB — GLUCOSE, POCT (MANUAL RESULT ENTRY): POC GLUCOSE: 97 mg/dL (ref 70–99)

## 2016-12-07 MED ORDER — LEVOTHYROXINE SODIUM 100 MCG PO TABS: 100.0000 ug | ORAL_TABLET | Freq: Every day | ORAL | 0 refills | Status: DC

## 2016-12-07 MED ORDER — LISINOPRIL 5 MG PO TABS: ORAL_TABLET | ORAL | 0 refills | Status: DC

## 2016-12-07 NOTE — Patient Instructions (Addendum)
For everyday aches and pains take 1000 mg of Tylenol every eight hours.     IF you received an x-ray today, you will receive an invoice from [facility]. Please contact [facility] at [phone] with questions or concerns regarding your invoice.   IF you received labwork today, you will receive an invoice from [facility]. Please contact [facility] at [phone] with questions or concerns regarding your invoice.   Our billing staff will not be able to assist you with questions regarding bills from these companies.  You will be contacted with the lab results as soon as they are available. The fastest way to get your results is to activate your My Chart account. Instructions are located on the last page of this paperwork. If you have not heard from [place] regarding the results in 2 weeks, please contact this office.

## 2016-12-07 NOTE — Progress Notes (Signed)
12/09/2016 1:50 PM   DOB: 1930/09/14 / MRN: [id number]  SUBJECTIVE:  Larry Cordova is a 82 y.o. male presenting for medication refills. He is unable to provide any history and tells me that "the crows are [place] for the winter."  He says he can "cough and spit phlegm 20 feet."   He wife is here today and tells me that this is normal for him and she thinks he has been declining for years.  Reports "he is mean at home" and he can not take care of himself.    He has No Known Allergies.   He  has a past medical history of Allergy; Arthritis; Depression; Hypertension; Kyphosis; Thyroid disease; and Tinnitus.    He  reports that he has quit smoking. His smoking use included Cigarettes, Pipe, and Cigars. He quit after 30.00 years of use. He does not have any smokeless tobacco history on file. He reports that he does not drink alcohol or use drugs. He  reports that he currently engages in sexual activity. The patient  has a past surgical history that includes Hernia repair.  His family history includes Kidney failure in his mother.  Review of Systems  Unable to perform ROS: Mental status change    The problem list and medications were reviewed and updated by myself where necessary and exist elsewhere in the encounter.   OBJECTIVE:  BP (!) 142/98   Pulse 66   Temp 97.5 F (36.4 C) (Oral)   Resp 18   Ht 5' 10.5" (1.791 m)   Wt 178 lb (80.7 kg)   SpO2 98%   BMI 25.18 kg/m   Physical Exam  Constitutional: He is oriented to person, place, and time.  Cardiovascular: Normal rate and regular rhythm.   Pulmonary/Chest: Effort normal. He has rales (left lower lobe).  Musculoskeletal: Normal range of motion.  Neurological: He is alert and oriented to person, place, and time.  Psychiatric: His affect is labile and inappropriate. His speech is delayed and tangential. Cognition and memory are impaired. He is inattentive.    Lab Results  Component Value Date   TSH 32.860 (H)  12/07/2016     Results for orders placed or performed in visit on 12/07/16 (from the past 72 hour(s))  CMP14+EGFR     Status: None   Collection Time: 12/07/16  5:40 PM  Result Value Ref Range   Glucose 90 65 - 99 mg/dL   BUN 11 8 - 27 mg/dL   Creatinine, Ser 0.97 0.76 - 1.27 mg/dL   GFR calc non Af Amer 71 >59 mL/min/1.73   GFR calc Af Amer 83 >59 mL/min/1.73   BUN/Creatinine Ratio 11 10 - 24   Sodium 143 134 - 144 mmol/L   Potassium 4.5 3.5 - 5.2 mmol/L   Chloride 102 96 - 106 mmol/L   CO2 28 18 - 29 mmol/L   Calcium 9.1 8.6 - 10.2 mg/dL   Total Protein 7.1 6.0 - 8.5 g/dL   Albumin 4.1 3.5 - 4.7 g/dL   Globulin, Total 3.0 1.5 - 4.5 g/dL   Albumin/Globulin Ratio 1.4 1.2 - 2.2   Bilirubin Total 0.5 0.0 - 1.2 mg/dL   Alkaline Phosphatase 72 39 - 117 IU/L   AST 33 0 - 40 IU/L   ALT 19 0 - 44 IU/L  TSH     Status: Abnormal   Collection Time: 12/07/16  5:40 PM  Result Value Ref Range   TSH 32.860 (H) 0.450 - 4.500 uIU/mL  Specimen Status     Status: None (Preliminary result)   Collection Time: 12/07/16  5:40 PM  Result Value Ref Range   WBC WILL FOLLOW    RBC WILL FOLLOW    Hemoglobin WILL FOLLOW    Hematocrit WILL FOLLOW    MCV WILL FOLLOW    MCH WILL FOLLOW    MCHC WILL FOLLOW    RDW WILL FOLLOW    Platelets WILL FOLLOW    Neutrophils WILL FOLLOW    Lymphs WILL FOLLOW    Monocytes WILL FOLLOW    Eos WILL FOLLOW    Basos WILL FOLLOW    Neutrophils Absolute WILL FOLLOW    Lymphocytes Absolute WILL FOLLOW    Monocytes Absolute WILL FOLLOW    EOS (ABSOLUTE) WILL FOLLOW    Basophils Absolute WILL FOLLOW    Immature Granulocytes WILL FOLLOW    Immature Grans (Abs) WILL FOLLOW   POCT glucose (manual entry)     Status: None   Collection Time: 12/07/16  5:55 PM  Result Value Ref Range   POC Glucose 97 70 - 99 mg/dl  POCT CBC     Status: None   Collection Time: 12/07/16  5:57 PM  Result Value Ref Range   WBC 8.8 4.6 - 10.2 K/uL   Lymph, poc 2.5 0.6 - 3.4   POC LYMPH  PERCENT 28.8 10 - 50 %L   MID (cbc) 0.5 0 - 0.9   POC MID % 5.7 0 - 12 %M   POC Granulocyte 5.8 2 - 6.9   Granulocyte percent 65.5 37 - 80 %G   RBC 4.93 4.69 - 6.13 M/uL   Hemoglobin 15.4 14.1 - 18.1 g/dL   HCT, POC 43.7 43.5 - 53.7 %   MCV 88.7 80 - 97 fL   MCH, POC 31.1 27 - 31.2 pg   MCHC 35.1 31.8 - 35.4 g/dL   RDW, POC 13.9 %   Platelet Count, POC 180 142 - 424 K/uL   MPV 8.2 0 - 99.8 fL  POCT urinalysis dipstick     Status: None   Collection Time: 12/07/16  6:50 PM  Result Value Ref Range   Color, UA yellow yellow   Clarity, UA clear clear   Glucose, UA negative negative   Bilirubin, UA negative negative   Ketones, POC UA negative negative   Spec Grav, UA 1.010    Blood, UA negative negative   pH, UA 5.0    Protein Ur, POC negative negative   Urobilinogen, UA 0.2    Nitrite, UA Negative Negative   Leukocytes, UA Negative Negative    No results found. Wt Readings from Last 3 Encounters:  12/07/16 178 lb (80.7 kg)  11/22/15 174 lb (78.9 kg)  07/29/14 170 lb 6.4 oz (77.3 kg)     ASSESSMENT AND PLAN  Parthiv was seen today for medication refill, other, other and sinusitis.  Diagnoses and all orders for this visit:  Signs and symptoms involving cognition:  [phone].  I suspect he as a chronic and progressive dementia. He wife reports there have been no acute changes over the last few days and is work up is normal.  He will see neuro and I have ordered a non stat non contrast CT brain to assess for mass occupying lesion and brain mass.   -     POCT CBC -     POCT glucose (manual entry) -     POCT urinalysis dipstick -     POCT  Microscopic Urinalysis ([facility]) -     CMP14+EGFR -     EKG 12-Lead -     DG Chest 2 View; Future -     Ambulatory referral to Neurology -     CT Head Wo Contrast; Future  Elevated TSH: He has not been taking his thyroid medication for a few months now so I am expecting a very high TSH.  Will plan to recheck in 6 weeks.  -     TSH        -     levothyroxine (SYNTHROID, LEVOTHROID) 100 MCG tablet; Take 1 tablet (100 mcg       total) by mouth daily.  Cardiac enlargement: EKG normal and showing no evidence of LVH.   -     Brain natriuretic peptide  Essential hypertension: Will start him back on his previous dose and reassess in about 3-4 weeks.  -     lisinopril (PRINIVIL,ZESTRIL) 5 MG tablet; TAKE 1 TABLET BY MOUTH ONCE DAILY    The patient is advised to call or return to clinic if he does not see an improvement in symptoms, or to seek the care of the closest emergency department if he worsens with the above plan.   Philis Fendt, MHS, PA-C [facility] 12/09/2016 1:50 PM

## 2016-12-08 LAB — SPECIMEN STATUS

## 2016-12-11 LAB — CMP14+EGFR
ALT: 19 IU/L (ref 0–44)
AST: 33 IU/L (ref 0–40)
Albumin/Globulin Ratio: 1.4 (ref 1.2–2.2)
Albumin: 4.1 g/dL (ref 3.5–4.7)
Alkaline Phosphatase: 72 IU/L (ref 39–117)
BILIRUBIN TOTAL: 0.5 mg/dL (ref 0.0–1.2)
BUN/Creatinine Ratio: 11 (ref 10–24)
BUN: 11 mg/dL (ref 8–27)
CALCIUM: 9.1 mg/dL (ref 8.6–10.2)
CHLORIDE: 102 mmol/L (ref 96–106)
CO2: 28 mmol/L (ref 18–29)
Creatinine, Ser: 0.97 mg/dL (ref 0.76–1.27)
GFR calc Af Amer: 83 mL/min/{1.73_m2} (ref >59)
GFR calc non Af Amer: 71 mL/min/{1.73_m2} (ref >59)
Globulin, Total: 3 g/dL (ref 1.5–4.5)
Glucose: 90 mg/dL (ref 65–99)
POTASSIUM: 4.5 mmol/L (ref 3.5–5.2)
Sodium: 143 mmol/L (ref 134–144)
Total Protein: 7.1 g/dL (ref 6.0–8.5)

## 2016-12-11 LAB — TSH: TSH: 32.86 u[IU]/mL — AB (ref 0.450–4.500)

## 2016-12-11 LAB — BRAIN NATRIURETIC PEPTIDE

## 2016-12-12 ENCOUNTER — Other Ambulatory Visit: Payer: Medicare Other | Admitting: Emergency Medicine

## 2016-12-12 ENCOUNTER — Other Ambulatory Visit: Payer: Self-pay

## 2016-12-12 DIAGNOSIS — I517 Cardiomegaly: Secondary | ICD-10-CM

## 2016-12-12 LAB — BRAIN NATRIURETIC PEPTIDE: BNP: 40.1 pg/mL (ref 0.0–100.0)

## 2016-12-17 ENCOUNTER — Ambulatory Visit
Admission: RE | Admit: 2016-12-17 | Discharge: 2016-12-17 | Disposition: A | Discharge status: Home / Self Care | Payer: Medicare Other | Source: Ambulatory Visit | Attending: Physician Assistant | Admitting: Physician Assistant

## 2016-12-17 DIAGNOSIS — R4189 Other symptoms and signs involving cognitive functions and awareness: Secondary | ICD-10-CM

## 2016-12-17 NOTE — Progress Notes (Signed)
Please print this off for his wife, Ms. Rayborn, who plans to take the results to the neurology appointment. Philis Fendt, MS, PA-C 5:11 PM, 12/17/2016

## 2016-12-31 ENCOUNTER — Ambulatory Visit: Payer: Medicare Other | Admitting: Physician Assistant

## 2017-01-01 ENCOUNTER — Encounter: Payer: Self-pay | Admitting: Neurology

## 2017-01-01 ENCOUNTER — Ambulatory Visit (INDEPENDENT_AMBULATORY_CARE_PROVIDER_SITE_OTHER): Payer: Medicare Other | Admitting: Neurology

## 2017-01-01 VITALS — BP 125/87 | HR 58 | Ht 75.0 in | Wt 175.4 lb

## 2017-01-01 DIAGNOSIS — G3109 Other frontotemporal dementia: Secondary | ICD-10-CM | POA: Diagnosis not present

## 2017-01-01 DIAGNOSIS — E538 Deficiency of other specified B group vitamins: Secondary | ICD-10-CM

## 2017-01-01 DIAGNOSIS — F0391 Unspecified dementia with behavioral disturbance: Secondary | ICD-10-CM

## 2017-01-01 DIAGNOSIS — F0280 Dementia in other diseases classified elsewhere without behavioral disturbance: Secondary | ICD-10-CM | POA: Diagnosis not present

## 2017-01-01 MED ORDER — DIVALPROEX SODIUM ER 250 MG PO TB24: 500.0000 mg | ORAL_TABLET | Freq: Every day | ORAL | 12 refills | Status: DC

## 2017-01-01 NOTE — Progress Notes (Signed)
[facility]    Provider:  Dr Jaynee Eagles Referring Provider: Kathlen Brunswick, PA-C Primary Care Physician:  Kathlen Brunswick, PA-C  CC:  Memory loss  HPI:  Larry Cordova is a 83 y.o. male here as a referral from Dr. Elder Cyphers for memory loss. Past medical history hypertension, depression, anxiety, skin cancer. Here with his wife of 78 years. Wife provides all information. Symptoms started years ago. He has not driven in 5 years.  He cannot drive anymore. (Difficult to conduct exam because patient won't stop talking and gets agitated).  Wife manages the medications. He is repeating the same stories over and over again per wife. Doesn't remember to take medications. He gets very angry. He won't stop talking per wife, and can't redirect him either. He gets very agitated. Wife is his caretaker and she needs help with all ADLs and IADLs. We wakes up angry. He talks "crap all the time". He leaves the water running in the house, they have to watch him all the time so their are no accidents. He turns the thermostat low. Wife is having a difficult time. Doing all kinds of "crazy things". He has a good appetite. He has delusions, he thinks people are against him, delusions but no halucinations. He thinks his wife is going to send him to a "psycho house". Symptoms have been slowly progressive. Started more with personality changes,. More personality changes with memory loss but less affected is memory and moreso behavioral changes. There is no filter, he gets angry, no swearing and no increased sexuality. He talks all day long and won't stop and then gets angry. Not threatening behavior however. Wife does not feel overwhelmed.    Reviewed notes, labs and imaging from outside physicians, which showed:  BUN 11, creatinine 0.970, TSH 32.86 12/07/2016.  Personally reviewed images and agree with the following: CT head 12/17/2014  FINDINGS: Brain: Moderate global atrophy. Ventricular prominence  probably related to atrophy rather than hydrocephalus.  Chronic microvascular changes without CT evidence of large acute infarct.  No intracranial hemorrhage.  No intracranial mass lesion noted on this unenhanced exam.  Vascular: Vascular calcifications.  Skull: Negative.  Sinuses/Orbits: No acute orbital abnormality. Minimal mucosal thickening ethmoid sinus air cells.  Other: Negative  IMPRESSION: Moderate global atrophy and chronic microvascular changes.  Review of Systems: Patient complains of symptoms per HPI as well as the following symptoms: Cold, hearing loss, ringing in ears, constipation, joint pain, cramps, aching muscles, easy bruising, allergies, runny nose, memory loss, confusion, depression, anxiety, disinterest in activities, racing thoughts. Pertinent negatives per HPI. All others negative.   Social History   Social History  . Marital status: Single    Spouse name: N/A  . Number of children: N/A  . Years of education: 10th grade   Occupational History  . retired    Social History Main Topics  . Smoking status: Former Smoker    Years: 30.00    Types: Cigarettes, Pipe, Cigars  . Smokeless tobacco: Never Used  . Alcohol use No  . Drug use: No  . Sexual activity: Yes   Other Topics Concern  . Not on file   Social History Narrative   Lives with wife   Caffeine use: none    Family History  Problem Relation Age of Onset  . Kidney failure Mother   . Dementia Neg Hx     Past Medical History:  Diagnosis Date  . Allergy   . Arthritis   . Depression   . Hypertension   .  Kyphosis   . Thyroid disease   . Tinnitus     Past Surgical History:  Procedure Laterality Date  . HERNIA REPAIR      Current Outpatient Prescriptions  Medication Sig Dispense Refill  . aspirin 81 MG tablet Take 81 mg by mouth daily.    Marland Kitchen levothyroxine (SYNTHROID, LEVOTHROID) 100 MCG tablet Take 1 tablet (100 mcg total) by mouth daily. 90 tablet 0  . lisinopril  (PRINIVIL,ZESTRIL) 5 MG tablet TAKE 1 TABLET BY MOUTH ONCE DAILY 90 tablet 0  . Multiple Vitamin (MULTIVITAMIN) tablet Take 1 tablet by mouth daily.    . Pseudoephedrine HCl (SUDAFED PO) Take by mouth.    . traMADol (ULTRAM) 50 MG tablet take 1 tablet by mouth every 8 hours if needed for pain 60 tablet 2  . divalproex (DEPAKOTE ER) 250 MG 24 hr tablet Take 2 tablets (500 mg total) by mouth at bedtime. 60 tablet 12   No current facility-administered medications for this visit.     Allergies as of 01/01/2017  . (No Known Allergies)    Vitals: BP 125/87 (BP Location: Right Arm, Patient Position: Sitting, Cuff Size: Normal)   Pulse (!) 58   Ht 6\' 3"  (1.905 m)   Wt 175 lb 6.4 oz (79.6 kg)   SpO2 95%   BMI 21.92 kg/m  Last Weight:  Wt Readings from Last 1 Encounters:  01/01/17 175 lb 6.4 oz (79.6 kg)   Last Height:   Ht Readings from Last 1 Encounters:  01/01/17 6\' 3"  (1.905 m)   Physical exam: Exam: Gen: Agitated, tangential, cannot redirect, persistent speaking CV: no MRG. No Carotid Bruits.  No peripheral edema, warm, nontender Eyes: Conjunctivae clear without exudates or hemorrhage  Neuro: Detailed Neurologic Exam  Speech:    Speech is normal; fluent and pressured with impaired comprehension.  Cognition:  MMSE - Mini Mental State Exam 01/01/2017  Orientation to time 4  Orientation to Place 4  Registration 3  Attention/ Calculation 0  Recall 0  Language- name 2 objects 2  Language- repeat 1  Language- follow 3 step command 2  Language- read & follow direction 1  Write a sentence 0  Copy design 0  Total score 17      The patient is oriented to person     recent and remote memory impaired;     language fluent;     Impaired attention, concentration, fund of knowledge Cranial Nerves:    The pupils are equal, round, and reactive to light. Attempted fundoscopic exam could not visualize due to patient non-cooperation. Blinks to threat bilaterally. Extraocular  movements are intact. Trigeminal sensation is intact and the muscles of mastication are normal. The face is symmetric. The palate elevates in the midline. Hearing intact to voice. Voice is mildly hoarse. Shoulder shrug is normal. The tongue has normal motion without fasciculations.   Coordination:    No apparent dysmetria  Gait:    Significantly stooped, uses a cane, not shuffling  Motor Observation:    no involuntary movements noted. Tone:    Normal muscle tone.      Strength: Difficult motor exam due to cognitive impairment but patient is moving all extremities equally and anti-gravity with no apparent weakness.      Sensation: intact to LT     Reflex Exam:  DTR's:    Deep tendon reflexes in the upper and lower extremities are brisk and symmetric bilaterally.   Toes:    Attempted, was unable due to complete  due to patient incooperation  Clonus:    Not present  Assessment/Plan:  83 year old with progressive dementia which may be frontotemporal. CT showed global atrophy but I think more pronounced in the frontal areas however difficult to tell on CT and patient would not tolerate MRI.   Needs to follow with pcp for hypothyroidism TSH 32.86 asap Check b12 and rpr today Start Depakote for agitation, discussed side effects. If this does not work can try Seroquel. At this time could start Aricept and may consider at future appointment but will not likely make much of a clinical significance. F/u 4 months will check CMP at that time and CBC due to initiation of Depakote and after that every 6 months  Today's history and physical demonstrated very substantial and measurable cognitive losses consistent with dementia. Based on  the substantial degree of impairment it is clear that he does not have the capacity to make an informed and appropriate decisions on his healthcare and finances. I do recommend that he lives in a structured setting or with 24x7 assistance at home which at this point  is provided by wife who is his caretaker. It is also clear that patient does not comprehend the degree of cognitive losses as this patient is suffering from substantial cognitive impairment due to dementia.  Cc: Kathlen Brunswick, PA-C   Sarina Ill, MD  [facility] [address] [place], [place] [address]  Phone [phone] Fax [phone]

## 2017-01-01 NOTE — Patient Instructions (Signed)
As far as your medications are concerned, I would like to suggest: Depakote start with one pill at bedtime and in 2 weeks increase to 2 pills at bedtime for agitation  As far as diagnostic testing: Labs  I would like to see you back 6 months, sooner if we need to. Please call us with any interim questions, concerns, problems, updates or refill requests.   Our phone number is [phone]. We also have an after hours call service for urgent matters and there is a physician on-call for urgent questions. For any emergencies you know to call 911 or go to the nearest emergency room  Valproic Acid, Divalproex Sodium delayed or extended-release tablets  What should I tell my health care provider before I take this medicine? They need to know if you have any of these conditions: -if you often drink alcohol -kidney disease -liver disease -low platelet counts -mitochondrial disease -suicidal thoughts, plans, or attempt; a previous suicide attempt by you or a family member -urea cycle disorder (UCD) -an unusual or allergic reaction to divalproex sodium, sodium valproate, valproic acid, other medicines, foods, dyes, or preservatives -pregnant or trying to get pregnant -breast-feeding How should I use this medicine? Take this medicine by mouth with a drink of water. Follow the directions on the prescription label. Do not cut, crush or chew this medicine. You can take it with or without food. If it upsets your stomach, take it with food. Take your medicine at regular intervals. Do not take it more often than directed. Do not stop taking except on your doctor's advice. A special MedGuide will be given to you by the pharmacist with each prescription and refill. Be sure to read this information carefully each time. Talk to your pediatrician regarding the use of this medicine in children. While this drug may be prescribed for children as young as 10 years for selected conditions, precautions do  apply. Overdosage: If you think you have taken too much of this medicine contact a poison control center or emergency room at once. NOTE: This medicine is only for you. Do not share this medicine with others. What if I miss a dose? If you miss a dose, take it as soon as you can. If it is almost time for your next dose, take only that dose. Do not take double or extra doses. What may interact with this medicine? Do not take this medicine with any of the following medications: -sodium phenylbutyrate This medicine may also interact with the following medications: -aspirin -certain antibiotics like ertapenem, imipenem, meropenem -certain medicines for depression, anxiety, or psychotic disturbances -certain medicines for seizures like carbamazepine, clonazepam, diazepam, ethosuximide, felbamate, lamotrigine, phenobarbital, phenytoin, primidone, rufinamide, topiramate -certain medicines that treat or prevent blood clots like warfarin -cholestyramine -male hormones, like estrogens and birth control pills, patches, or rings -propofol -rifampin -ritonavir -tolbutamide -zidovudine This list may not describe all possible interactions. Give your health care provider a list of all the medicines, herbs, non-prescription drugs, or dietary supplements you use. Also tell them if you smoke, drink alcohol, or use illegal drugs. Some items may interact with your medicine. What should I watch for while using this medicine? Tell your doctor or healthcare professional if your symptoms do not get better or they start to get worse. Wear a medical ID bracelet or chain, and carry a card that describes your disease and details of your medicine and dosage times. You may get drowsy, dizzy, or have blurred vision. Do not drive, use machinery, or  do anything that needs mental alertness until you know how this medicine affects you. To reduce dizzy or fainting spells, do not sit or stand up quickly, especially if you are  an older patient. Alcohol can increase drowsiness and dizziness. Avoid alcoholic drinks. This medicine can make you more sensitive to the sun. Keep out of the sun. If you cannot avoid being in the sun, wear protective clothing and use sunscreen. Do not use sun lamps or tanning beds/booths. Patients and their families should watch out for new or worsening depression or thoughts of suicide. Also watch out for sudden changes in feelings such as feeling anxious, agitated, panicky, irritable, hostile, aggressive, impulsive, severely restless, overly excited and hyperactive, or not being able to sleep. If this happens, especially at the beginning of treatment or after a change in dose, call your health care professional. Women should inform their doctor if they wish to become pregnant or think they might be pregnant. There is a potential for serious side effects to an unborn child. Talk to your health care professional or pharmacist for more information. Women who become pregnant while using this medicine may enroll in the [place] Pregnancy Registry by calling [phone]. This registry collects information about the safety of antiepileptic drug use during pregnancy. What side effects may I notice from receiving this medicine? Side effects that you should report to your doctor or health care professional as soon as possible: -allergic reactions like skin rash, itching or hives, swelling of the face, lips, or tongue -changes in vision -redness, blistering, peeling or loosening of the skin, including inside the mouth -signs and symptoms of liver injury like dark yellow or brown urine; general ill feeling or flu-like symptoms; light-colored stools; loss of appetite; nausea; right upper belly pain; unusually weak or tired; yellowing of the eyes or skin -suicidal thoughts or other mood changes -unusual bleeding or bruising Side effects that usually do not require medical attention  (report to your doctor or health care professional if they continue or are bothersome): -constipation -diarrhea -dizziness -hair loss -headache -loss of appetite -weight gain This list may not describe all possible side effects. Call your doctor for medical advice about side effects. You may report side effects to FDA at 1-800-FDA-1088. Where should I keep my medicine? Keep out of reach of children. Store at room temperature between 15 and 30 degrees C (59 and 86 degrees F). Keep container tightly closed. Throw away any unused medicine after the expiration date. NOTE: This sheet is a summary. It may not cover all possible information. If you have questions about this medicine, talk to your doctor, pharmacist, or health care provider.  2017 Elsevier/Gold Standard (2016-03-01 07:11:40)

## 2017-01-02 ENCOUNTER — Telehealth: Payer: Self-pay | Admitting: *Deleted

## 2017-01-02 LAB — RPR: RPR Ser Ql: NONREACTIVE

## 2017-01-02 LAB — VITAMIN B12: Vitamin B-12: 723 pg/mL (ref 232–1245)

## 2017-01-02 NOTE — Telephone Encounter (Signed)
Called and spoke to wife about lab results per AA,MD note. She verbalized understanding .

## 2017-01-02 NOTE — Telephone Encounter (Signed)
-----   Message from Melvenia Beam, MD sent at 01/02/2017  8:48 AM EST ----- Labs normal thanks

## 2017-01-03 ENCOUNTER — Encounter: Payer: Self-pay | Admitting: Neurology

## 2017-01-03 DIAGNOSIS — F0280 Dementia in other diseases classified elsewhere without behavioral disturbance: Secondary | ICD-10-CM | POA: Insufficient documentation

## 2017-01-03 DIAGNOSIS — G3109 Other frontotemporal dementia: Secondary | ICD-10-CM

## 2017-01-14 ENCOUNTER — Encounter: Payer: Self-pay | Admitting: Physician Assistant

## 2017-01-14 ENCOUNTER — Ambulatory Visit (INDEPENDENT_AMBULATORY_CARE_PROVIDER_SITE_OTHER): Payer: Medicare Other | Admitting: Physician Assistant

## 2017-01-14 VITALS — BP 128/76 | HR 86 | Temp 97.4°F | Resp 18 | Ht 75.0 in | Wt 171.0 lb

## 2017-01-14 DIAGNOSIS — G8929 Other chronic pain: Secondary | ICD-10-CM | POA: Diagnosis not present

## 2017-01-14 DIAGNOSIS — R946 Abnormal results of thyroid function studies: Secondary | ICD-10-CM | POA: Diagnosis not present

## 2017-01-14 DIAGNOSIS — M545 Low back pain: Secondary | ICD-10-CM

## 2017-01-14 DIAGNOSIS — I10 Essential (primary) hypertension: Secondary | ICD-10-CM

## 2017-01-14 DIAGNOSIS — Z23 Encounter for immunization: Secondary | ICD-10-CM | POA: Diagnosis not present

## 2017-01-14 DIAGNOSIS — Z79899 Other long term (current) drug therapy: Secondary | ICD-10-CM

## 2017-01-14 DIAGNOSIS — R7989 Other specified abnormal findings of blood chemistry: Secondary | ICD-10-CM

## 2017-01-14 MED ORDER — TRAMADOL HCL 50 MG PO TABS: 50.0000 mg | ORAL_TABLET | Freq: Two times a day (BID) | ORAL | 3 refills | Status: DC | PRN

## 2017-01-14 NOTE — Patient Instructions (Signed)
     IF you received an x-ray today, you will receive an invoice from [facility]. Please contact [facility] at [phone] with questions or concerns regarding your invoice.   IF you received labwork today, you will receive an invoice from [facility]. Please contact [facility] at [phone] with questions or concerns regarding your invoice.   Our billing staff will not be able to assist you with questions regarding bills from these companies.  You will be contacted with the lab results as soon as they are available. The fastest way to get your results is to activate your My Chart account. Instructions are located on the last page of this paperwork. If you have not heard from us regarding the results in 2 weeks, please contact this office.     

## 2017-01-14 NOTE — Progress Notes (Signed)
01/14/2017 3:37 PM   DOB: 1930-08-21 / MRN: [id number]  SUBJECTIVE:  Larry Cordova is a 83 y.o. male presenting for recheck of TSH and BP.  Last time I saw him I refilled his medications and sent him to neuro for abnormal behavior.  He was subsequently diagnosed with likely frontotemporal dementia and started on depakote.  Aricept was held given this would not likely help his prognosis.   Immunization History  Administered Date(s) Administered  . Influenza,inj,Quad PF,36+ Mos 07/29/2014, 11/22/2015, 01/14/2017  . Pneumococcal Conjugate-13 07/29/2014  . Pneumococcal Polysaccharide-23 03/10/2010  . Tdap 03/10/2010  . Zoster 05/10/2010     He has No Known Allergies.   He  has a past medical history of Allergy; Arthritis; Depression; Hypertension; Kyphosis; Thyroid disease; and Tinnitus.    He  reports that he has quit smoking. His smoking use included Cigarettes, Pipe, and Cigars. He quit after 30.00 years of use. He has never used smokeless tobacco. He reports that he does not drink alcohol or use drugs. He  reports that he currently engages in sexual activity. The patient  has a past surgical history that includes Hernia repair.  His family history includes Kidney failure in his mother.  Review of Systems  Unable to perform ROS: Dementia    The problem list and medications were reviewed and updated by myself where necessary and exist elsewhere in the encounter.   OBJECTIVE:  BP 128/76 (BP Location: Right Arm, Patient Position: Sitting, Cuff Size: Small)   Pulse 86   Temp 97.4 F (36.3 C) (Oral)   Resp 18   Ht 6\' 3"  (1.905 m)   Wt 171 lb (77.6 kg)   SpO2 96%   BMI 21.37 kg/m   BP Readings from Last 3 Encounters:  01/14/17 128/76  01/01/17 125/87  12/07/16 (!) 142/98   Lab Results  Component Value Date   TSH 32.860 (H) 12/07/2016   Lab Results  Component Value Date   CREATININE 0.97 12/07/2016   Lab Results  Component Value Date   ALT 19 12/07/2016   AST 33  12/07/2016   ALKPHOS 72 12/07/2016   BILITOT 0.5 12/07/2016   Lab Results  Component Value Date   NA 143 12/07/2016   K 4.5 12/07/2016   CL 102 12/07/2016   CO2 28 12/07/2016         Physical Exam  Constitutional: He is oriented to person, place, and time. No distress.  Disheveled  Cardiovascular: Normal rate and regular rhythm.   Pulmonary/Chest: Effort normal and breath sounds normal.  Musculoskeletal: Normal range of motion.  Neurological: He is alert and oriented to person, place, and time.  Skin: Skin is warm and dry. He is not diaphoretic.  Psychiatric: His affect is labile and inappropriate. His speech is tangential. Cognition and memory are not impaired. He expresses impulsivity. He exhibits normal recent memory and normal remote memory. He is attentive.    No results found for this or any previous visit (from the past 72 hour(s)).  No results found.  ASSESSMENT AND PLAN:  Larry Cordova was seen today for follow-up.  Diagnoses and all orders for this visit:  Elevated TSH -     TSH  Essential hypertension -     Basic metabolic panel  On valproate therapy -     Valproic acid level  Need for prophylactic vaccination and inoculation against influenza -     Flu Vaccine QUAD 36+ mos IM  Chronic low back pain, unspecified back pain laterality,  with sciatica presence unspecified -     traMADol (ULTRAM) 50 MG tablet; Take 1 tablet (50 mg total) by mouth every 12 (twelve) hours as needed for moderate pain or severe pain.    The patient is advised to call or return to clinic if he does not see an improvement in symptoms, or to seek the care of the closest emergency department if he worsens with the above plan.   Philis Fendt, MHS, PA-C [facility] 01/14/2017 3:37 PM

## 2017-01-15 LAB — BASIC METABOLIC PANEL
BUN/Creatinine Ratio: 13 (ref 10–24)
BUN: 12 mg/dL (ref 8–27)
CALCIUM: 9.2 mg/dL (ref 8.6–10.2)
CO2: 27 mmol/L (ref 18–29)
Chloride: 102 mmol/L (ref 96–106)
Creatinine, Ser: 0.89 mg/dL (ref 0.76–1.27)
GFR calc Af Amer: 91 mL/min/{1.73_m2} (ref >59)
GFR, EST NON AFRICAN AMERICAN: 79 mL/min/{1.73_m2} (ref >59)
GLUCOSE: 95 mg/dL (ref 65–99)
Potassium: 4.7 mmol/L (ref 3.5–5.2)
Sodium: 143 mmol/L (ref 134–144)

## 2017-01-15 LAB — VALPROIC ACID LEVEL: VALPROIC ACID LVL: 29 ug/mL — AB (ref 50–100)

## 2017-01-15 LAB — TSH: TSH: 0.03 u[IU]/mL — ABNORMAL LOW (ref 0.450–4.500)

## 2017-01-18 NOTE — Progress Notes (Signed)
RTC in about 6 weeks.  Please make him an appointment. Philis Fendt, MS, PA-C 2:01 PM, 01/18/2017

## 2017-01-30 ENCOUNTER — Other Ambulatory Visit: Payer: Self-pay | Admitting: Physician Assistant

## 2017-01-30 DIAGNOSIS — I10 Essential (primary) hypertension: Secondary | ICD-10-CM

## 2017-03-01 ENCOUNTER — Other Ambulatory Visit: Payer: Self-pay | Admitting: Physician Assistant

## 2017-03-01 DIAGNOSIS — E038 Other specified hypothyroidism: Secondary | ICD-10-CM

## 2017-03-18 ENCOUNTER — Ambulatory Visit (INDEPENDENT_AMBULATORY_CARE_PROVIDER_SITE_OTHER): Payer: Medicare Other | Admitting: Physician Assistant

## 2017-03-18 ENCOUNTER — Encounter: Payer: Self-pay | Admitting: Physician Assistant

## 2017-03-18 VITALS — BP 140/85 | HR 72 | Temp 98.1°F | Resp 16 | Ht 75.0 in | Wt 177.8 lb

## 2017-03-18 DIAGNOSIS — E038 Other specified hypothyroidism: Secondary | ICD-10-CM

## 2017-03-18 DIAGNOSIS — G3109 Other frontotemporal dementia: Secondary | ICD-10-CM | POA: Diagnosis not present

## 2017-03-18 DIAGNOSIS — F0280 Dementia in other diseases classified elsewhere without behavioral disturbance: Secondary | ICD-10-CM

## 2017-03-18 DIAGNOSIS — I10 Essential (primary) hypertension: Secondary | ICD-10-CM | POA: Diagnosis not present

## 2017-03-18 DIAGNOSIS — M4316 Spondylolisthesis, lumbar region: Secondary | ICD-10-CM

## 2017-03-18 DIAGNOSIS — E039 Hypothyroidism, unspecified: Secondary | ICD-10-CM | POA: Diagnosis not present

## 2017-03-18 DIAGNOSIS — M4310 Spondylolisthesis, site unspecified: Secondary | ICD-10-CM

## 2017-03-18 HISTORY — DX: Spondylolisthesis, site unspecified: M43.10

## 2017-03-18 MED ORDER — LEVOTHYROXINE SODIUM 100 MCG PO TABS: 100.0000 ug | ORAL_TABLET | Freq: Every day | ORAL | 0 refills | Status: DC

## 2017-03-18 NOTE — Progress Notes (Signed)
03/18/2017 2:11 PM   DOB: [birth year] / MRN: [id number]  SUBJECTIVE:  Larry Cordova is a 83 y.o. male presenting for recheck of several issues.  He was recently diagnosed with frontotemporal dementia by Dr. Jaynee Eagles who started the patient on Depakote. The last time I saw Larry Cordova he was only taking half the prescribed dose of Depakote but agreed to start the full dose after some discussion. He has follow up with Dr. Jaynee Eagles in this month.    He has a history of spondylolistheses and takes tramadol for this and this and his pain is well controlled. His wife, who is with him today, tells me he is required less and less of this.    Current Outpatient Prescriptions:  .  divalproex (DEPAKOTE ER) 250 MG 24 hr tablet, Take 2 tablets (500 mg total) by mouth at bedtime., Disp: 60 tablet, Rfl: 12 .  levothyroxine (SYNTHROID, LEVOTHROID) 100 MCG tablet, Take 1 tablet (100 mcg total) by mouth daily before breakfast., Disp: 90 tablet, Rfl: 0 .  traMADol (ULTRAM) 50 MG tablet, Take 1 tablet (50 mg total) by mouth every 12 (twelve) hours as needed for moderate pain or severe pain., Disp: 60 tablet, Rfl: 3   He has No Known Allergies.   He  has a past medical history of Allergy; Arthritis; Depression; Hypertension; Kyphosis; Spondylolisthesis (03/18/2017); Thyroid disease; and Tinnitus.    He  reports that he has quit smoking. His smoking use included Cigarettes, Pipe, and Cigars. He quit after 30.00 years of use. He has never used smokeless tobacco. He reports that he does not drink alcohol or use drugs. He  reports that he currently engages in sexual activity. The patient  has a past surgical history that includes Hernia repair.  His family history includes Kidney failure in his mother.  Review of Systems  Constitutional: Negative for chills, diaphoresis and fever.  Eyes: Negative.   Respiratory: Negative for cough, hemoptysis, sputum production, shortness of breath and wheezing.   Cardiovascular: Negative  for chest pain, orthopnea and leg swelling.  Gastrointestinal: Negative for nausea.  Skin: Negative for rash.  Neurological: Negative for dizziness, sensory change, speech change, focal weakness and headaches.    The problem list and medications were reviewed and updated by myself where necessary and exist elsewhere in the encounter.   OBJECTIVE:  BP 140/85   Pulse 72   Temp 98.1 F (36.7 C) (Oral)   Resp 16   Ht 6\' 3"  (1.905 m)   Wt 177 lb 12.8 oz (80.6 kg)   SpO2 97%   BMI 22.22 kg/m   Physical Exam  Vitals reviewed.   Lab Results  Component Value Date   WBC 8.8 12/07/2016   HGB 15.4 12/07/2016   HCT 43.7 12/07/2016   MCV 88.7 12/07/2016   PLT WILL FOLLOW 12/07/2016    Lab Results  Component Value Date   NA 143 01/14/2017   K 4.7 01/14/2017   CL 102 01/14/2017   CO2 27 01/14/2017    Lab Results  Component Value Date   CREATININE 0.89 01/14/2017    Lab Results  Component Value Date   ALT 19 12/07/2016   AST 33 12/07/2016   ALKPHOS 72 12/07/2016   BILITOT 0.5 12/07/2016    Lab Results  Component Value Date   TSH 0.030 (L) 01/14/2017    Lab Results  Component Value Date   VALPROATE 29 (L) 01/14/2017   Wt Readings from Last 3 Encounters:  03/18/17 177 lb 12.8  oz (80.6 kg)  01/14/17 171 lb (77.6 kg)  01/01/17 175 lb 6.4 oz (79.6 kg)     No results found for this or any previous visit (from the past 72 hour(s)).  No results found.  ASSESSMENT AND PLAN:  Larry Cordova was seen today for follow-up.  Diagnoses and all orders for this visit:  Hypothyroidism, unspecified type: Screening TSH today.  Will see him back in 6 months as long as all is well.  -     TSH -     levothyroxine (SYNTHROID, LEVOTHROID) 100 MCG tablet; Take 1 tablet (100 mcg total) by mouth daily before breakfast.  Essential hypertension: Appears to have largely resolved without BP medication.  Will continue to follow this. Weight is stable.   Frontotemporal dementia: His  wife tells me he is much better with regard to sleep-wake cycle and is much less agitated.  He continues to take things apart at home and is often found cleaning the toilet excessively with peroxide. He is eating well. Wife receiving help from her son who also lives at home.  Will check the Depakote level today. RTC in 6 months, or sooner if any deviation from baseline with regard to behavior.  -     Valproic acid level  Spondylolisthesis of lumbar region: Well controlled and he is requiring less tramadol.        The patient is advised to call or return to clinic if he does not see an improvement in symptoms, or to seek the care of the closest emergency department if he worsens with the above plan.   Philis Fendt, MHS, PA-C [facility] 03/18/2017 2:11 PM

## 2017-03-18 NOTE — Patient Instructions (Signed)
     IF you received an x-ray today, you will receive an invoice from [facility]. Please contact  Radiology at [phone] with questions or concerns regarding your invoice.   IF you received labwork today, you will receive an invoice from [facility]. Please contact [facility] at [phone] with questions or concerns regarding your invoice.   Our billing staff will not be able to assist you with questions regarding bills from these companies.  You will be contacted with the lab results as soon as they are available. The fastest way to get your results is to activate your My Chart account. Instructions are located on the last page of this paperwork. If you have not heard from us regarding the results in 2 weeks, please contact this office.     

## 2017-03-19 LAB — TSH: TSH: 15.95 u[IU]/mL — ABNORMAL HIGH (ref 0.450–4.500)

## 2017-03-19 LAB — VALPROIC ACID LEVEL: VALPROIC ACID LVL: 34 ug/mL — AB (ref 50–100)

## 2017-03-21 LAB — SPECIMEN STATUS REPORT

## 2017-03-21 LAB — T4, FREE: Free T4: 0.89 ng/dL (ref 0.82–1.77)

## 2017-06-03 ENCOUNTER — Ambulatory Visit: Payer: Medicare Other | Admitting: Adult Health

## 2017-08-03 ENCOUNTER — Other Ambulatory Visit: Payer: Self-pay | Admitting: Physician Assistant

## 2017-08-03 DIAGNOSIS — M545 Low back pain: Principal | ICD-10-CM

## 2017-08-03 DIAGNOSIS — G8929 Other chronic pain: Secondary | ICD-10-CM

## 2017-08-07 ENCOUNTER — Encounter: Payer: Self-pay | Admitting: Physician Assistant

## 2017-08-07 ENCOUNTER — Ambulatory Visit (INDEPENDENT_AMBULATORY_CARE_PROVIDER_SITE_OTHER): Payer: Medicare Other | Admitting: Physician Assistant

## 2017-08-07 VITALS — BP 130/84 | HR 72 | Temp 97.6°F | Resp 18 | Ht 75.0 in | Wt 182.6 lb

## 2017-08-07 DIAGNOSIS — Z1321 Encounter for screening for nutritional disorder: Secondary | ICD-10-CM

## 2017-08-07 DIAGNOSIS — Z13228 Encounter for screening for other metabolic disorders: Secondary | ICD-10-CM | POA: Diagnosis not present

## 2017-08-07 DIAGNOSIS — Z23 Encounter for immunization: Secondary | ICD-10-CM

## 2017-08-07 DIAGNOSIS — Z0000 Encounter for general adult medical examination without abnormal findings: Secondary | ICD-10-CM

## 2017-08-07 DIAGNOSIS — Z1329 Encounter for screening for other suspected endocrine disorder: Secondary | ICD-10-CM

## 2017-08-07 DIAGNOSIS — Z130 Encounter for screening for diseases of the blood and blood-forming organs and certain disorders involving the immune mechanism: Secondary | ICD-10-CM

## 2017-08-07 NOTE — Patient Instructions (Signed)
     IF you received an x-ray today, you will receive an invoice from [facility]. Please contact [facility] at [phone] with questions or concerns regarding your invoice.   IF you received labwork today, you will receive an invoice from [facility]. Please contact [facility] at [phone] with questions or concerns regarding your invoice.   Our billing staff will not be able to assist you with questions regarding bills from these companies.  You will be contacted with the lab results as soon as they are available. The fastest way to get your results is to activate your My Chart account. Instructions are located on the last page of this paperwork. If you have not heard from us regarding the results in 2 weeks, please contact this office.     

## 2017-08-07 NOTE — Progress Notes (Signed)
08/07/2017 1:32 PM   DOB: 04/30/1930 / MRN: [id number]  SUBJECTIVE:  Larry Cordova is a 83 y.o. male presenting for annual exam. Has a history of frontotemporal dementia and takes Depakote which has been beneficial with regard to agitation. He has a history of poorly managed hypothyroidism. He feels well today and has no complaints.   Immunization History  Administered Date(s) Administered  . Influenza,inj,Quad PF,6+ Mos 07/29/2014, 11/22/2015, 01/14/2017, 08/07/2017  . Pneumococcal Conjugate-13 07/29/2014  . Pneumococcal Polysaccharide-23 03/10/2010  . Tdap 03/10/2010  . Zoster 05/10/2010     He has No Known Allergies.   He  has a past medical history of Allergy; Arthritis; Depression; Hypertension; Kyphosis; Spondylolisthesis (03/18/2017); Thyroid disease; and Tinnitus.    He  reports that he has quit smoking. His smoking use included Cigarettes, Pipe, and Cigars. He quit after 30.00 years of use. He has never used smokeless tobacco. He reports that he does not drink alcohol or use drugs. He  reports that he currently engages in sexual activity. The patient  has a past surgical history that includes Hernia repair.  His family history includes Kidney failure in his mother.  Review of Systems  Constitutional: Negative for chills, diaphoresis and fever.  Eyes: Negative.   Respiratory: Negative for cough, hemoptysis, sputum production, shortness of breath and wheezing.   Cardiovascular: Negative for chest pain, orthopnea and leg swelling.  Gastrointestinal: Negative for nausea.  Skin: Negative for rash.  Neurological: Negative for dizziness, sensory change, speech change, focal weakness and headaches.    The problem list and medications were reviewed and updated by myself where necessary and exist elsewhere in the encounter.   OBJECTIVE:  BP 130/84 (BP Location: Left Arm, Patient Position: Sitting, Cuff Size: Normal)   Pulse 72   Temp 97.6 F (36.4 C) (Oral)   Resp 18   Ht  6\' 3"  (1.905 m)   Wt 182 lb 9.6 oz (82.8 kg)   SpO2 95%   BMI 22.82 kg/m   Wt Readings from Last 3 Encounters:  08/07/17 182 lb 9.6 oz (82.8 kg)  03/18/17 177 lb 12.8 oz (80.6 kg)  01/14/17 171 lb (77.6 kg)     Physical Exam  Constitutional: He is oriented to person, place, and time. He appears well-developed. He is active and cooperative.  Non-toxic appearance.  Eyes: Pupils are equal, round, and reactive to light. EOM are normal.  Cardiovascular: Normal rate, regular rhythm, S1 normal, S2 normal, normal heart sounds, intact distal pulses and normal pulses.  Exam reveals no gallop and no friction rub.   No murmur heard. Pulmonary/Chest: Effort normal. No stridor. No tachypnea. No respiratory distress. He has no wheezes. He has no rales.  Abdominal: He exhibits no distension.  Musculoskeletal: He exhibits no edema.  Neurological: He is alert and oriented to person, place, and time. He has normal strength and normal reflexes. He is not disoriented. No cranial nerve deficit or sensory deficit. He exhibits normal muscle tone. Coordination and gait normal.  Skin: Skin is warm and dry. He is not diaphoretic. No pallor.  Psychiatric: His affect is blunt and labile. His speech is tangential. His speech is not delayed. He is hyperactive. He is not slowed. Thought content is not paranoid and not delusional. Cognition and memory are normal. He expresses impulsivity. He does not express inappropriate judgment. He expresses no homicidal and no suicidal ideation.  Vitals reviewed.    No results found for this or any previous visit (from the past  72 hour(s)).  No results found.  ASSESSMENT AND PLAN:  Larry Cordova was seen today for annual exam.  Diagnoses and all orders for this visit:  Annual physical exam: Given his advanced age and history of dementia I think he is doing very well overall.  Exam normal for him today.  Will try to pin down his thyroid dosing however it is hard to get him back  for recheck labs only.   Screening for endocrine, nutritional, metabolic and immunity disorder -     CBC -     TSH -     Hemoglobin A1c -     CMP and Liver  Need for prophylactic vaccination and inoculation against influenza -     Flu Vaccine QUAD 36+ mos IM    The patient is advised to call or return to clinic if he does not see an improvement in symptoms, or to seek the care of the closest emergency department if he worsens with the above plan.   Philis Fendt, MHS, PA-C [facility] 08/07/2017 1:32 PM

## 2017-08-08 ENCOUNTER — Encounter: Payer: Medicare Other | Admitting: Physician Assistant

## 2017-08-08 LAB — HEMOGLOBIN A1C
ESTIMATED AVERAGE GLUCOSE: 111 mg/dL
Hgb A1c MFr Bld: 5.5 % (ref 4.8–5.6)

## 2017-08-08 LAB — CMP AND LIVER
ALT: 14 IU/L (ref 0–44)
AST: 24 IU/L (ref 0–40)
Albumin: 3.9 g/dL (ref 3.5–4.7)
Alkaline Phosphatase: 56 IU/L (ref 39–117)
BILIRUBIN TOTAL: 0.6 mg/dL (ref 0.0–1.2)
BUN: 9 mg/dL (ref 8–27)
Bilirubin, Direct: 0.14 mg/dL (ref 0.00–0.40)
CHLORIDE: 106 mmol/L (ref 96–106)
CO2: 29 mmol/L (ref 20–29)
Calcium: 9.1 mg/dL (ref 8.6–10.2)
Creatinine, Ser: 0.82 mg/dL (ref 0.76–1.27)
GFR calc Af Amer: 94 mL/min/{1.73_m2} (ref >59)
GFR, EST NON AFRICAN AMERICAN: 81 mL/min/{1.73_m2} (ref >59)
GLUCOSE: 91 mg/dL (ref 65–99)
Potassium: 5.2 mmol/L (ref 3.5–5.2)
Sodium: 145 mmol/L — ABNORMAL HIGH (ref 134–144)
Total Protein: 6.8 g/dL (ref 6.0–8.5)

## 2017-08-08 LAB — CBC
HEMOGLOBIN: 14.5 g/dL (ref 13.0–17.7)
Hematocrit: 42 % (ref 37.5–51.0)
MCH: 30.8 pg (ref 26.6–33.0)
MCHC: 34.5 g/dL (ref 31.5–35.7)
MCV: 89 fL (ref 79–97)
PLATELETS: 172 10*3/uL (ref 150–379)
RBC: 4.71 x10E6/uL (ref 4.14–5.80)
RDW: 14.7 % (ref 12.3–15.4)
WBC: 7.9 10*3/uL (ref 3.4–10.8)

## 2017-08-08 LAB — TSH: TSH: 35.28 u[IU]/mL — AB (ref 0.450–4.500)

## 2017-08-10 ENCOUNTER — Other Ambulatory Visit: Payer: Self-pay | Admitting: Physician Assistant

## 2017-08-10 MED ORDER — LEVOTHYROXINE SODIUM 112 MCG PO TABS: 112.0000 ug | ORAL_TABLET | Freq: Every day | ORAL | 1 refills | Status: DC

## 2017-08-10 NOTE — Progress Notes (Signed)
TSH elevated. Increasing dose to 112 from 100.   RTC in 3 months for recheck.

## 2017-09-11 ENCOUNTER — Ambulatory Visit: Payer: Medicare Other | Admitting: Adult Health

## 2017-09-23 ENCOUNTER — Ambulatory Visit: Payer: Medicare Other | Admitting: Physician Assistant

## 2017-11-25 ENCOUNTER — Telehealth: Payer: Self-pay

## 2017-11-25 NOTE — Telephone Encounter (Signed)
Spoke with Larry Cordova and patient has been hiding his pills. Sounds like he only missed a few doses.  He is getting back on track now and his behavior seems to be improving per Larry Cordova. He was last seen by Dr. Jaynee Eagles about 1 year ago and I advised Larry Cordova to call Dr. Cathren Laine office to establish follow up.  Larry Cordova wants me to change the medication to a short acting so that she may place in food.  I am hesitant to do this given my lack of expertise with Depakote.   Dr. Jaynee Eagles would you mind please forwarding to your scheduling staff to establish a follow up time with Larry Cordova.   Larry Fendt, Larry Cordova, Larry Cordova 3:16 PM, 11/25/2017

## 2017-11-25 NOTE — Telephone Encounter (Signed)
Copied from [place] #[id number]. Topic: Quick Communication - See Telephone Encounter >> Nov 25, 2017 12:59 PM Hewitt Shorts wrote: CRM for notification. See Telephone encounter for: pt is needing to talk with Philis Fendt about changed pt meds (divalproex) to what can be crushed or liquid pt is starting to hide his meds and becoming difficult due to not taking his meds.  Wife would also like o know when pt needs to see Garret Reddish again the last time was 10/18  Best number [phone] 11/25/17.

## 2017-11-25 NOTE — Telephone Encounter (Signed)
Romelle Starcher, can you make a follow up with Cecille Rubin or Jinny Blossom next year first available? Thank you.

## 2017-11-25 NOTE — Telephone Encounter (Signed)
Called and LVM for pt's wife, Jana Half (on [place]) asking for call back. Will be scheduling pt a f/u appt w/ one of the NPs.

## 2017-11-26 ENCOUNTER — Emergency Department (HOSPITAL_COMMUNITY): Payer: Medicare Other

## 2017-11-26 ENCOUNTER — Emergency Department (HOSPITAL_COMMUNITY)
Admission: EM | Admit: 2017-11-26 | Discharge: 2017-11-26 | Disposition: A | Discharge status: Home / Self Care | Payer: Medicare Other | Attending: Emergency Medicine | Admitting: Emergency Medicine

## 2017-11-26 ENCOUNTER — Encounter (HOSPITAL_COMMUNITY): Payer: Self-pay | Admitting: *Deleted

## 2017-11-26 DIAGNOSIS — F0390 Unspecified dementia without behavioral disturbance: Secondary | ICD-10-CM | POA: Insufficient documentation

## 2017-11-26 DIAGNOSIS — Y92009 Unspecified place in unspecified non-institutional (private) residence as the place of occurrence of the external cause: Secondary | ICD-10-CM | POA: Diagnosis not present

## 2017-11-26 DIAGNOSIS — Z79899 Other long term (current) drug therapy: Secondary | ICD-10-CM | POA: Insufficient documentation

## 2017-11-26 DIAGNOSIS — W19XXXA Unspecified fall, initial encounter: Secondary | ICD-10-CM

## 2017-11-26 DIAGNOSIS — Y999 Unspecified external cause status: Secondary | ICD-10-CM | POA: Insufficient documentation

## 2017-11-26 DIAGNOSIS — W1830XA Fall on same level, unspecified, initial encounter: Secondary | ICD-10-CM | POA: Diagnosis not present

## 2017-11-26 DIAGNOSIS — M545 Low back pain, unspecified: Secondary | ICD-10-CM

## 2017-11-26 DIAGNOSIS — Y939 Activity, unspecified: Secondary | ICD-10-CM | POA: Diagnosis not present

## 2017-11-26 DIAGNOSIS — Z87891 Personal history of nicotine dependence: Secondary | ICD-10-CM | POA: Insufficient documentation

## 2017-11-26 DIAGNOSIS — I10 Essential (primary) hypertension: Secondary | ICD-10-CM | POA: Diagnosis not present

## 2017-11-26 NOTE — ED Provider Notes (Signed)
[facility] DEPT Provider Note   CSN: [id number] Arrival date & time: 11/26/17  1538     History   Chief Complaint Chief Complaint  Patient presents with  . Fall  . Back Pain    HPI  Larry Cordova is a 83 y.o. Male history of dementia, hypertension, hypothyroidism and arthritis, presents via EMS after an unwitnessed fall 2 days ago, patient was ambulatory for EMS.  Patient's wife reports on Sunday morning she went into the bedroom and found the patient on the floor by the bed trying to put his shoes on, and thinks he may have slipped off the bed, patient was not complaining of any injuries immediately after the fall, but is since then complained of some low back and hip pain, has continued to be ambulatory, although wife says he spends most of his day sitting in the chair, wife reports the daughter thought that patient would need to be checked out.  When asked about the fall the patient begins talking about a fall from a be a week ago, during this now, but does not mention anything about a fall 2 days ago.  Patient reports some aches and pains in his low back and hips, denying any focal pain today, denies any chest pain, abdominal pain, shortness of breath.  Patient denies hitting his head, any pain in the head, vision changes or nausea or vomiting.  Patient is not on blood thinners. Wife reports pt have had decline in his mental status over the past several weeks with increasing agitation, but no acute change after the fall.  Level V Caveat: Dementia      Past Medical History:  Diagnosis Date  . Allergy   . Arthritis   . Depression   . Hypertension   . Kyphosis   . Spondylolisthesis 03/18/2017  . Thyroid disease   . Tinnitus     Patient Active Problem List   Diagnosis Date Noted  . Spondylolisthesis 03/18/2017  . Frontotemporal dementia 01/03/2017  . Skin cancer 07/29/2014  . Other and unspecified hyperlipidemia 07/29/2014  . Arthritis  07/13/2013  . Hypothyroid 06/16/2012  . HTN (hypertension) 06/16/2012    Past Surgical History:  Procedure Laterality Date  . HERNIA REPAIR         Home Medications    Prior to Admission medications   Medication Sig Start Date End Date Taking? Authorizing Provider  divalproex (DEPAKOTE ER) 250 MG 24 hr tablet Take 2 tablets (500 mg total) by mouth at bedtime. 01/01/17   Melvenia Beam, MD  levothyroxine (SYNTHROID, LEVOTHROID) 112 MCG tablet Take 1 tablet (112 mcg total) by mouth daily. 08/10/17   Tereasa Coop, PA-C  traMADol Veatrice Bourbon) 50 MG tablet take 1 tablet by mouth every 12 hours if needed for pain or severe pain 08/05/17   Tereasa Coop, PA-C    Family History Family History  Problem Relation Age of Onset  . Kidney failure Mother   . Dementia Neg Hx     Social History Social History   Tobacco Use  . Smoking status: Former Smoker    Years: 30.00    Types: Cigarettes, Pipe, Cigars  . Smokeless tobacco: Never Used  Substance Use Topics  . Alcohol use: No  . Drug use: No     Allergies   Patient has no known allergies.   Review of Systems Review of Systems  Unable to perform ROS: Dementia     Physical Exam Updated Vital Signs BP Marland Kitchen)  150/80 (BP Location: Left Arm)   Pulse 65   Temp 97.7 F (36.5 C) (Oral)   Resp 16   SpO2 98%   Physical Exam  Constitutional: He appears well-developed and well-nourished. No distress.  HENT:  Head: Normocephalic and atraumatic.  Scalp NTTP, no evidence of trauma, no hematomas or lacerations  Eyes: EOM are normal. Pupils are equal, round, and reactive to light. Right eye exhibits no discharge. Left eye exhibits no discharge.  Neck: Normal range of motion. Neck supple.  C-spine NTTP at midline or paraspinally  Cardiovascular: Normal rate, regular rhythm, normal heart sounds and intact distal pulses.  Pulmonary/Chest: Effort normal and breath sounds normal. No stridor. No respiratory distress. He has no wheezes.  He has no rales.  Abdominal: Soft. Bowel sounds are normal. He exhibits no distension and no mass. There is no tenderness. There is no guarding.  Musculoskeletal:  T-spine and L-spine NTTP at midline or paraspinally, no palpable deformity or crepitus, no bruising or erythema, no pain elicited with palpation of lower back or hips. All joints supple and easily moveable, all compartments soft, pt able to bear weight on bilateral lower extremities, DP pulses 2+, sensation intact, 5/5 dorsi and plantar flexion bilaterally  Neurological: He is alert. Coordination normal.  Speech is clear, able to follow most commands CN III-XII intact Normal strength in upper and lower extremities bilaterally including dorsiflexion and plantar flexion, strong and equal grip strength Sensation normal to light and sharp touch Moves extremities without ataxia, coordination intact  Skin: Skin is warm and dry. Capillary refill takes less than 2 seconds. He is not diaphoretic.  No bruising, abrasions or lacerations noted  Psychiatric: He has a normal mood and affect. His behavior is normal.  Nursing note and vitals reviewed.    ED Treatments / Results  Labs (all labs ordered are listed, but only abnormal results are displayed) Labs Reviewed - No data to display  EKG  EKG Interpretation None       Radiology Dg Lumbar Spine Complete  Result Date: 11/26/2017 CLINICAL DATA:  Fall EXAM: LUMBAR SPINE - COMPLETE 4+ VIEW COMPARISON:  09/25/2005 FINDINGS: Dextroscoliosis of the spine. 10 mm anterolisthesis of L3 on L4. Moderate severe degenerative changes at L3-L4 and L4-L5. Slight increased superior endplate deformity at L1. Remaining vertebral body heights appear maintained. IMPRESSION: 1. Slight increased superior endplate deformity at L1, uncertain chronicity 2. Advanced degenerative changes at L3-L4 and L4-L5. Electronically Signed   By: Donavan Foil M.D.   On: 11/26/2017 18:45   Dg Hips Bilat W Or Wo Pelvis 5  Views  Result Date: 11/26/2017 CLINICAL DATA:  Per EMS, pt from home fell 2 days ago. Pt family states they found the patient on the floor. Fall was unwitnessed. Pt complains of low back/hip pain. Pt has hx of dementia. Pt was ambulatory for EMS. EXAM: DG HIP (WITH OR WITHOUT PELVIS) 5+V BILAT COMPARISON:  None. FINDINGS: No fracture, bone lesion or dislocation. Mild bilateral concentric hip joint space narrowing. No other arthropathic change. The SI joints and symphysis pubis are normally aligned. There are disc degenerative changes of the visualized lower lumbar spine. Bones are demineralized. Soft tissues are unremarkable. IMPRESSION: No fracture or dislocation.  No acute finding. Electronically Signed   By: Lajean Manes M.D.   On: 11/26/2017 17:42    Procedures Procedures (including critical care time)  Medications Ordered in ED Medications - No data to display   Initial Impression / Assessment and Plan / ED Course  I have reviewed the triage vital signs and the nursing notes.  Pertinent labs & imaging results that were available during my care of the patient were reviewed by me and considered in my medical decision making (see chart for details).  Pt presents after unwitnessed fall 2 days ago, denies head trauma, complaining of intermittent low back and hip pain. Wife reports pt's mental status is at baseline and he has been acting himself. Pt is mildly hypertensive, but vitals otherwise normal, no neurologic deficits on exam. DO not feel that head imaging is necessary at this time. No pain elicited on exam of hips and lower back and pt was ambulatory for EMS, will get lumbar spine and hip films to rule out fracture, if normal anticipate discharge if pt able to ambulate independently without pain.  X-rays show no acute fracture or abnormality. Pt abel to ambulate in the hall with cane, which he uses at baseline. Pt stable for discharge home with tylenol for pain. Wife requests case  management consult for potential home health, consult placed for case management to follow up with them tomorrow. At discharge pt is in NAD and denying pain, return precautions discussed.  Patient discussed with Dr. Kathrynn Humble, who saw patient as well and agrees with plan.   Final Clinical Impressions(s) / ED Diagnoses   Final diagnoses:  Fall, initial encounter  Acute bilateral low back pain without sciatica    ED Discharge Orders    None       Jacqlyn Larsen, [place] 11/27/17 [place], MD 11/27/17 1530

## 2017-11-26 NOTE — Progress Notes (Signed)
CSW acknowledges consult. Consult appropriate for Case Manager due to inquiring about home health care.   Order put in for Case Management.   Wendelyn Breslow, [facility]  [phone]

## 2017-11-26 NOTE — Telephone Encounter (Addendum)
Pt's wife called back, scheduled an appt with Megan 11/28/17 @ 3pm.  FYI (pt's wife said it is hard to make him go to appt's but is going to definitely try)

## 2017-11-26 NOTE — ED Notes (Signed)
Pt wife verbalized difficulty caring for pt at home and asked for home health references. Case management order placed. Pt increasing angry when attempting to assist him into wheelchair and car. Wife states she has help waiting at home that she is okay to go. Pt had calmed down once in the car.

## 2017-11-26 NOTE — ED Notes (Signed)
Spoke with pa about pt ambulating in the room

## 2017-11-26 NOTE — ED Notes (Signed)
EDPA Provider at bedside. 

## 2017-11-26 NOTE — ED Notes (Signed)
ED Provider at bedside. EDP NANAVATI PRESENT

## 2017-11-26 NOTE — ED Triage Notes (Signed)
Per EMS, pt from home fell 2 days ago. Pt family states they found the patient on the floor. Fall was unwitnessed. Pt complains of low back/hip pain. Pt has hx of dementia. Pt was ambulatory for EMS.

## 2017-11-26 NOTE — Discharge Instructions (Signed)
Your evaluation today shows no evidence of fractures.  Please walk at home with assistance to prevent future falls.  Follow-up with your primary care doctor as needed.  Fall and hitting her head, or experience weakness on one side of the body, or if you have worsening back pain, with numbness or tingling in the legs return to the ED.

## 2017-11-27 ENCOUNTER — Telehealth: Payer: Self-pay | Admitting: Emergency Medicine

## 2017-11-27 NOTE — Telephone Encounter (Addendum)
CM consulted for HHS.  Spoke with pt's son who stated that when they got the pt home last night he refused to get out of the car.  The family had to all EMS and the sheriff out to get him into the house.  He reported the pt was awake all night and is finally sleeping in the bed.  Spoke with Mrs. Warnick, pt's wife, who states she has used [facility] in the past and would like to use them again.  Contacted Tim with [facility] who was not able to accept the pt.  Contacted Crystal with [facility] who was able to accept the pt for services and will be in touch with the wife today.  Pt's wife additionally wanted [facility] through [facility].  CM tried to explain the difference between [facility] and [facility] and insurance vs. private pay.  Contacted Danny with [facility] who is going to contact the pt's wife.  No further CM needs noted at this time.

## 2017-11-27 NOTE — Care Management Note (Signed)
Case Management Note  Patient Details  Name: [name] MRN: [id number] Date of Birth: [birth year]  Please see telephone note.  Expected Discharge Date:    11/27/2017              Expected Discharge Plan:  [place]  In-House Referral:  Clinical Social Work  Discharge planning Services  CM Consult  Post Acute Care Choice:  Home Health Choice offered to:  Spouse  HH Arranged:  RN, PT, OT, Nurse's Aide, Social Work, Designer, jewellery, Speech Therapy [place] Agency:  [facility])  Status of Service:  Completed, signed off  Rae Mar, RN 11/27/2017, 1:39 PM

## 2017-11-28 ENCOUNTER — Ambulatory Visit: Payer: Medicare Other | Admitting: Adult Health

## 2017-11-29 ENCOUNTER — Encounter: Payer: Self-pay | Admitting: Adult Health

## 2017-12-11 ENCOUNTER — Ambulatory Visit: Payer: Medicare Other | Admitting: Adult Health

## 2017-12-11 NOTE — Progress Notes (Deleted)
PATIENT: Larry Cordova DOB: [birth year]  REASON FOR VISIT: follow up HISTORY FROM: patient  HISTORY OF PRESENT ILLNESS: HISTORY   Raed R Mattos is a 84 y.o. male here as a referral from Dr. Elder Cyphers for memory loss. Past medical history hypertension, depression, anxiety, skin cancer. Here with his wife of 65 years. Wife provides all information. Symptoms started years ago. He has not driven in 5 years.  He cannot drive anymore. (Difficult to conduct exam because patient won't stop talking and gets agitated).  Wife manages the medications. He is repeating the same stories over and over again per wife. Doesn't remember to take medications. He gets very angry. He won't stop talking per wife, and can't redirect him either. He gets very agitated. Wife is his caretaker and she needs help with all ADLs and IADLs. We wakes up angry. He talks "crap all the time". He leaves the water running in the house, they have to watch him all the time so their are no accidents. He turns the thermostat low. Wife is having a difficult time. Doing all kinds of "crazy things". He has a good appetite. He has delusions, he thinks people are against him, delusions but no halucinations. He thinks his wife is going to send him to a "psycho house". Symptoms have been slowly progressive. Started more with personality changes,. More personality changes with memory loss but less affected is memory and moreso behavioral changes. There is no filter, he gets angry, no swearing and no increased sexuality. He talks all day long and won't stop and then gets angry. Not threatening behavior however. Wife does not feel overwhelmed.    Reviewed notes, labs and imaging from outside physicians, which showed:  BUN 11, creatinine 0.970, TSH 32.86 12/07/2016.  Personally reviewed images and agree with the following: CT head 12/17/2014  FINDINGS: Brain: Moderate global atrophy. Ventricular prominence probably related to atrophy rather than  hydrocephalus.  Chronic microvascular changes without CT evidence of large acute infarct.  No intracranial hemorrhage.  No intracranial mass lesion noted on this unenhanced exam.  Vascular: Vascular calcifications.  Skull: Negative.  Sinuses/Orbits: No acute orbital abnormality. Minimal mucosal thickening ethmoid sinus air cells.  Other: Negative  IMPRESSION: Moderate global atrophy and chronic microvascular changes.   REVIEW OF SYSTEMS: Out of a complete 14 system review of symptoms, the patient complains only of the following symptoms, and all other reviewed systems are negative.  ALLERGIES: No Known Allergies  HOME MEDICATIONS: Outpatient Medications Prior to Visit  Medication Sig Dispense Refill  . acetaminophen (TYLENOL) 500 MG tablet Take 1,000 mg by mouth every 6 (six) hours as needed for mild pain or headache.    . divalproex (DEPAKOTE ER) 250 MG 24 hr tablet Take 2 tablets (500 mg total) by mouth at bedtime. 60 tablet 12  . levothyroxine (SYNTHROID, LEVOTHROID) 112 MCG tablet Take 1 tablet (112 mcg total) by mouth daily. 90 tablet 1  . traMADol (ULTRAM) 50 MG tablet take 1 tablet by mouth every 12 hours if needed for pain or severe pain 60 tablet 3   No facility-administered medications prior to visit.     PAST MEDICAL HISTORY: Past Medical History:  Diagnosis Date  . Allergy   . Arthritis   . Depression   . Hypertension   . Kyphosis   . Spondylolisthesis 03/18/2017  . Thyroid disease   . Tinnitus     PAST SURGICAL HISTORY: Past Surgical History:  Procedure Laterality Date  . HERNIA REPAIR  FAMILY HISTORY: Family History  Problem Relation Age of Onset  . Kidney failure Mother   . Dementia Neg Hx     SOCIAL HISTORY: Social History   Socioeconomic History  . Marital status: Single    Spouse name: Not on file  . Number of children: Not on file  . Years of education: 10th grade  . Highest education level: Not on file  Social Needs   . Financial resource strain: Not on file  . Food insecurity - worry: Not on file  . Food insecurity - inability: Not on file  . Transportation needs - medical: Not on file  . Transportation needs - non-medical: Not on file  Occupational History  . Occupation: retired  Tobacco Use  . Smoking status: Former Smoker    Years: 30.00    Types: Cigarettes, Pipe, Cigars  . Smokeless tobacco: Never Used  Substance and Sexual Activity  . Alcohol use: No  . Drug use: No  . Sexual activity: Yes  Other Topics Concern  . Not on file  Social History Narrative   Lives with wife   Caffeine use: none      PHYSICAL EXAM  There were no vitals filed for this visit. There is no height or weight on file to calculate BMI.  Generalized: Well developed, in no acute distress   Neurological examination  Mentation: Alert oriented to time, place, history taking. Follows all commands speech and language fluent Cranial nerve II-XII: Pupils were equal round reactive to light. Extraocular movements were full, visual field were full on confrontational test. Facial sensation and strength were normal. Uvula tongue midline. Head turning and shoulder shrug  were normal and symmetric. Motor: The motor testing reveals 5 over 5 strength of all 4 extremities. Good symmetric motor tone is noted throughout.  Sensory: Sensory testing is intact to soft touch on all 4 extremities. No evidence of extinction is noted.  Coordination: Cerebellar testing reveals good finger-nose-finger and heel-to-shin bilaterally.  Gait and station: Gait is normal. Tandem gait is normal. Romberg is negative. No drift is seen.  Reflexes: Deep tendon reflexes are symmetric and normal bilaterally.   DIAGNOSTIC DATA (LABS, IMAGING, TESTING) - I reviewed patient records, labs, notes, testing and imaging myself where available.  Lab Results  Component Value Date   WBC 7.9 08/07/2017   HGB 14.5 08/07/2017   HCT 42.0 08/07/2017   MCV 89  08/07/2017   PLT 172 08/07/2017      Component Value Date/Time   NA 145 (H) 08/07/2017 1634   K 5.2 08/07/2017 1634   CL 106 08/07/2017 1634   CO2 29 08/07/2017 1634   GLUCOSE 91 08/07/2017 1634   GLUCOSE 96 07/29/2014 0906   BUN 9 08/07/2017 1634   CREATININE 0.82 08/07/2017 1634   CREATININE 0.94 07/29/2014 0906   CALCIUM 9.1 08/07/2017 1634   PROT 6.8 08/07/2017 1634   ALBUMIN 3.9 08/07/2017 1634   AST 24 08/07/2017 1634   ALT 14 08/07/2017 1634   ALKPHOS 56 08/07/2017 1634   BILITOT 0.6 08/07/2017 1634   GFRNONAA 81 08/07/2017 1634   GFRNONAA 82 05/11/2014 1231   GFRAA 94 08/07/2017 1634   GFRAA >89 05/11/2014 1231   Lab Results  Component Value Date   CHOL 179 07/29/2014   HDL 50 07/29/2014   LDLCALC 111 (H) 07/29/2014   TRIG 90 07/29/2014   CHOLHDL 3.6 07/29/2014   Lab Results  Component Value Date   HGBA1C 5.5 08/07/2017   Lab Results  Component Value Date   VITAMINB12 723 01/01/2017   Lab Results  Component Value Date   TSH 35.280 (H) 08/07/2017      ASSESSMENT AND PLAN 84 y.o. year old male  has a past medical history of Allergy, Arthritis, Depression, Hypertension, Kyphosis, Spondylolisthesis (03/18/2017), Thyroid disease, and Tinnitus. here with ***     Ward Givens, MSN, NP-C 12/11/2017, 12:01 PM [facility] [address], [place], [place] [address] [phone]

## 2017-12-12 ENCOUNTER — Encounter: Payer: Self-pay | Admitting: Adult Health

## 2017-12-12 ENCOUNTER — Ambulatory Visit: Payer: Medicare Other | Admitting: Adult Health

## 2017-12-12 VITALS — BP 153/92 | HR 71 | Wt 179.0 lb

## 2017-12-12 DIAGNOSIS — F0391 Unspecified dementia with behavioral disturbance: Secondary | ICD-10-CM | POA: Diagnosis not present

## 2017-12-12 DIAGNOSIS — R269 Unspecified abnormalities of gait and mobility: Secondary | ICD-10-CM

## 2017-12-12 DIAGNOSIS — Z5181 Encounter for therapeutic drug level monitoring: Secondary | ICD-10-CM | POA: Diagnosis not present

## 2017-12-12 MED ORDER — DIVALPROEX SODIUM ER 250 MG PO TB24: 500.0000 mg | ORAL_TABLET | Freq: Every day | ORAL | 12 refills | Status: DC

## 2017-12-12 NOTE — Progress Notes (Signed)
PATIENT: Larry Cordova DOB: September 24, 1930 She does have a REASON FOR VISIT: follow up-memory Larry FROM: patient  Larry OF PRESENT ILLNESS: Today 12/12/17 Larry Cordova is an 84 year old male with a Larry of memory disturbance.  He returns today for follow-up.  He is currently taking Depakote for agitation.  His wife reports that this is been working well.  She feels that his memory has remained the same.  He is able to complete all ADLs independently.  He no longer operates a Teacher, music.  His wife handles all the finances and does all the cooking.  She reports that he sleeps okay but feels that this could be better.  His balance is slightly off.  He has not had any falls.  I did put a referral in for home health for physical therapy.  Denies any new neurological symptoms.  Returns today for an evaluation.  Larry Cordova is a 84 y.o. male here as a referral from Dr. Elder Cyphers for memory loss. Past medical Larry hypertension, depression, anxiety, skin cancer. Here with his wife of 47 years. Wife provides all information. Symptoms started years ago. He has not driven in 5 years.  He cannot drive anymore. (Difficult to conduct exam because patient won't stop talking and gets agitated).  Wife manages the medications. He is repeating the same stories over and over again per wife. Doesn't remember to take medications. He gets very angry. He won't stop talking per wife, and can't redirect him either. He gets very agitated. Wife is his caretaker and she needs help with all ADLs and IADLs. We wakes up angry. He talks "crap all the time". He leaves the water running in the house, they have to watch him all the time so their are no accidents. He turns the thermostat low. Wife is having a difficult time. Doing all kinds of "crazy things". He has a good appetite. He has delusions, he thinks people are against him, delusions but no halucinations. He thinks his wife is going to send him to a "psycho  house". Symptoms have been slowly progressive. Started more with personality changes,. More personality changes with memory loss but less affected is memory and moreso behavioral changes. There is no filter, he gets angry, no swearing and no increased sexuality. He talks all day long and won't stop and then gets angry. Not threatening behavior however. Wife does not feel overwhelmed.    Reviewed notes, labs and imaging from outside physicians, which showed:  BUN 11, creatinine 0.970, TSH 32.86 12/07/2016.  Personally reviewed images and agree with the following: CT head 12/17/2014  FINDINGS: Brain: Moderate global atrophy. Ventricular prominence probably related to atrophy rather than hydrocephalus.  Chronic microvascular changes without CT evidence of large acute infarct.  No intracranial hemorrhage.  No intracranial mass lesion noted on this unenhanced exam.  Vascular: Vascular calcifications.  Skull: Negative.  Sinuses/Orbits: No acute orbital abnormality. Minimal mucosal thickening ethmoid sinus air cells.  Other: Negative  IMPRESSION: Moderate global atrophy and chronic microvascular changes.   REVIEW OF SYSTEMS: Out of a complete 14 system review of symptoms, the patient complains only of the following symptoms, and all other reviewed systems are negative.  Hearing loss, ringing in ears, runny nose, joint pain, back pain, aching muscles, walking difficulty, speech difficulty, weakness, memory loss, agitation, behavior problems, confusion  ALLERGIES: No Known Allergies  HOME MEDICATIONS: Outpatient Medications Prior to Visit  Medication Sig Dispense Refill  . acetaminophen (TYLENOL) 500 MG tablet Take 1,000  mg by mouth every 6 (six) hours as needed for mild pain or headache.    . divalproex (DEPAKOTE ER) 250 MG 24 hr tablet Take 2 tablets (500 mg total) by mouth at bedtime. 60 tablet 12  . levothyroxine (SYNTHROID, LEVOTHROID) 112 MCG tablet Take 1 tablet  (112 mcg total) by mouth daily. 90 tablet 1  . traMADol (ULTRAM) 50 MG tablet take 1 tablet by mouth every 12 hours if needed for pain or severe pain 60 tablet 3   No facility-administered medications prior to visit.     PAST MEDICAL Larry: Past Medical Larry:  Diagnosis Date  . Allergy   . Arthritis   . Depression   . Hypertension   . Kyphosis   . Spondylolisthesis 03/18/2017  . Thyroid disease   . Tinnitus     PAST SURGICAL Larry: Past Surgical Larry:  Procedure Laterality Date  . HERNIA REPAIR      FAMILY Larry: Family Larry  Problem Relation Age of Onset  . Kidney failure Mother   . Dementia Neg Hx     SOCIAL Larry: Social Larry   Socioeconomic Larry  . Marital status: Single    Spouse name: Not on file  . Number of children: Not on file  . Years of education: 10th grade  . Highest education level: Not on file  Social Needs  . Financial resource strain: Not on file  . Food insecurity - worry: Not on file  . Food insecurity - inability: Not on file  . Transportation needs - medical: Not on file  . Transportation needs - non-medical: Not on file  Occupational Larry  . Occupation: retired  Tobacco Use  . Smoking status: Former Smoker    Years: 30.00    Types: Cigarettes, Pipe, Cigars  . Smokeless tobacco: Never Used  Substance and Sexual Activity  . Alcohol use: No  . Drug use: No  . Sexual activity: Yes  Other Topics Concern  . Not on file  Social Larry Narrative   Lives with wife   Caffeine use: none      PHYSICAL EXAM  Vitals:   12/12/17 1351  BP: (!) 153/92  Pulse: 71  Weight: 179 lb (81.2 kg)   Body mass index is 22.37 kg/m.   MMSE - Mini Mental State Exam 12/12/2017 01/01/2017  Orientation to time 0 4  Orientation to Place 4 4  Registration 3 3  Attention/ Calculation 1 0  Recall 0 0  Language- name 2 objects 2 2  Language- repeat 0 1  Language- follow 3 step command 3 2  Language- read & follow direction 1  1  Write a sentence 1 0  Copy design 0 0  Total score 15 17     Generalized: Well developed, in no acute distress   Neurological examination  Mentation: Alert oriented to time, place, Larry taking. Follows all commands speech and language fluent Cranial nerve II-XII: Pupils were equal round reactive to light. Extraocular movements were full, visual field were full on confrontational test. Facial sensation and strength were normal. Uvula tongue midline. Head turning and shoulder shrug  were normal and symmetric. Motor: The motor testing reveals 5 over 5 strength of all 4 extremities. Good symmetric motor tone is noted throughout.  Sensory: Sensory testing is intact to soft touch on all 4 extremities. No evidence of extinction is noted.  Coordination: Cerebellar testing reveals good finger-nose-finger and heel-to-shin bilaterally.  Gait and station: Patient's gait is slightly unsteady.  Tandem gait  not attempted. Reflexes: Deep tendon reflexes are symmetric and normal bilaterally.   DIAGNOSTIC DATA (LABS, IMAGING, TESTING) - I reviewed patient records, labs, notes, testing and imaging myself where available.  Lab Results  Component Value Date   WBC 7.9 08/07/2017   HGB 14.5 08/07/2017   HCT 42.0 08/07/2017   MCV 89 08/07/2017   PLT 172 08/07/2017      Component Value Date/Time   NA 145 (H) 08/07/2017 1634   K 5.2 08/07/2017 1634   CL 106 08/07/2017 1634   CO2 29 08/07/2017 1634   GLUCOSE 91 08/07/2017 1634   GLUCOSE 96 07/29/2014 0906   BUN 9 08/07/2017 1634   CREATININE 0.82 08/07/2017 1634   CREATININE 0.94 07/29/2014 0906   CALCIUM 9.1 08/07/2017 1634   PROT 6.8 08/07/2017 1634   ALBUMIN 3.9 08/07/2017 1634   AST 24 08/07/2017 1634   ALT 14 08/07/2017 1634   ALKPHOS 56 08/07/2017 1634   BILITOT 0.6 08/07/2017 1634   GFRNONAA 81 08/07/2017 1634   GFRNONAA 82 05/11/2014 1231   GFRAA 94 08/07/2017 1634   GFRAA >89 05/11/2014 1231   Lab Results  Component Value  Date   CHOL 179 07/29/2014   HDL 50 07/29/2014   LDLCALC 111 (H) 07/29/2014   TRIG 90 07/29/2014   CHOLHDL 3.6 07/29/2014   Lab Results  Component Value Date   HGBA1C 5.5 08/07/2017   Lab Results  Component Value Date   VITAMINB12 723 01/01/2017   Lab Results  Component Value Date   TSH 35.280 (H) 08/07/2017      ASSESSMENT AND PLAN 84 y.o. year old male  has a past medical Larry of Allergy, Arthritis, Depression, Hypertension, Kyphosis, Spondylolisthesis (03/18/2017), Thyroid disease, and Tinnitus. here with:  1.  Memory disturbance 2.  Abnormality of gait  The patient's memory score has remained relatively the same.  The patient will remain on Depakote.  I will check blood work today.  I did put a referral in for home health for physical therapy.  Advised that if symptoms worsen or he develops new symptoms they should let us know.  He will follow-up in 6 months or sooner if needed.  I spent 15 minutes with the patient. 50% of this time was spent discussing memory score    Ward Givens, MSN, NP-C 12/12/2017, 1:56 PM [facility] [address] [place], [place] [address] [phone]

## 2017-12-12 NOTE — Patient Instructions (Signed)
Your Plan:  Continue Depakote  Blood work today If your symptoms worsen or you develop new symptoms please let us know.   Thank you for coming to see us at [facility]. I hope we have been able to provide you high quality care today.  You may receive a patient satisfaction survey over the next few weeks. We would appreciate your feedback and comments so that we may continue to improve ourselves and the health of our patients.  

## 2017-12-13 LAB — CBC WITH DIFFERENTIAL/PLATELET
Basophils Absolute: 0.1 10*3/uL (ref 0.0–0.2)
Basos: 1 %
EOS (ABSOLUTE): 0.5 10*3/uL — ABNORMAL HIGH (ref 0.0–0.4)
Eos: 6 %
Hematocrit: 42.5 % (ref 37.5–51.0)
Hemoglobin: 14.4 g/dL (ref 13.0–17.7)
Immature Grans (Abs): 0 10*3/uL (ref 0.0–0.1)
Immature Granulocytes: 0 %
LYMPHS ABS: 2.6 10*3/uL (ref 0.7–3.1)
Lymphs: 28 %
MCH: 30.8 pg (ref 26.6–33.0)
MCHC: 33.9 g/dL (ref 31.5–35.7)
MCV: 91 fL (ref 79–97)
MONOS ABS: 0.9 10*3/uL (ref 0.1–0.9)
Monocytes: 9 %
NEUTROS ABS: 5.3 10*3/uL (ref 1.4–7.0)
Neutrophils: 56 %
Platelets: 234 10*3/uL (ref 150–379)
RBC: 4.67 x10E6/uL (ref 4.14–5.80)
RDW: 13.7 % (ref 12.3–15.4)
WBC: 9.5 10*3/uL (ref 3.4–10.8)

## 2017-12-13 LAB — COMPREHENSIVE METABOLIC PANEL
ALBUMIN: 3.8 g/dL (ref 3.5–4.7)
ALK PHOS: 139 IU/L — AB (ref 39–117)
ALT: 20 IU/L (ref 0–44)
AST: 23 IU/L (ref 0–40)
Albumin/Globulin Ratio: 1.4 (ref 1.2–2.2)
BILIRUBIN TOTAL: 0.4 mg/dL (ref 0.0–1.2)
BUN / CREAT RATIO: 12 (ref 10–24)
BUN: 11 mg/dL (ref 8–27)
CO2: 23 mmol/L (ref 20–29)
CREATININE: 0.93 mg/dL (ref 0.76–1.27)
Calcium: 9.2 mg/dL (ref 8.6–10.2)
Chloride: 105 mmol/L (ref 96–106)
GFR calc non Af Amer: 75 mL/min/{1.73_m2} (ref >59)
GFR, EST AFRICAN AMERICAN: 86 mL/min/{1.73_m2} (ref >59)
GLOBULIN, TOTAL: 2.7 g/dL (ref 1.5–4.5)
Glucose: 93 mg/dL (ref 65–99)
Potassium: 4.9 mmol/L (ref 3.5–5.2)
SODIUM: 144 mmol/L (ref 134–144)
TOTAL PROTEIN: 6.5 g/dL (ref 6.0–8.5)

## 2017-12-13 LAB — VALPROIC ACID LEVEL: Valproic Acid Lvl: 31 ug/mL — ABNORMAL LOW (ref 50–100)

## 2017-12-17 ENCOUNTER — Telehealth: Payer: Self-pay | Admitting: *Deleted

## 2017-12-17 NOTE — Telephone Encounter (Signed)
LVM on home phone informing patient his blood work is relatively unremarkable. Advised the patient's alkaline phosphatase (liver enzyme) is slightly elevated. This potentially could be due to medication however Jinny Blossom will recheck it in 2 months. Left number for any questions.

## 2017-12-24 NOTE — Progress Notes (Signed)
Personally  participated in, made any corrections needed, and agree with history, physical, neuro exam,assessment and plan as stated above.    Shawneequa Baldridge, MD [facility] 

## 2017-12-25 ENCOUNTER — Telehealth: Payer: Self-pay | Admitting: Adult Health

## 2017-12-25 NOTE — Telephone Encounter (Signed)
Called [facility] and gave verbal order per NP to give PT once a week x 5 weeks. Sree verbalized understanding.

## 2017-12-25 NOTE — Telephone Encounter (Signed)
[facility] [phone] calling request VO PT 1 x 5 effective last week. Please call to advise

## 2018-01-05 ENCOUNTER — Other Ambulatory Visit: Payer: Self-pay | Admitting: Physician Assistant

## 2018-01-05 DIAGNOSIS — E038 Other specified hypothyroidism: Secondary | ICD-10-CM

## 2018-01-09 NOTE — Telephone Encounter (Signed)
Faxed the signed home health orders per [facility] request. Received a receipt of confirmation.

## 2018-02-19 ENCOUNTER — Telehealth: Payer: Self-pay | Admitting: *Deleted

## 2018-02-19 DIAGNOSIS — Z5181 Encounter for therapeutic drug level monitoring: Secondary | ICD-10-CM

## 2018-02-19 NOTE — Telephone Encounter (Signed)
Orders placed. Please make patient aware

## 2018-02-19 NOTE — Telephone Encounter (Signed)
Left detailed VM advising patient that he needs to come in this week for repeat labs. Advised no apt needed, gave him lab hours and left office number for any questions.

## 2018-03-11 ENCOUNTER — Other Ambulatory Visit (INDEPENDENT_AMBULATORY_CARE_PROVIDER_SITE_OTHER): Payer: Self-pay

## 2018-03-11 DIAGNOSIS — Z0289 Encounter for other administrative examinations: Secondary | ICD-10-CM

## 2018-03-11 DIAGNOSIS — Z5181 Encounter for therapeutic drug level monitoring: Secondary | ICD-10-CM

## 2018-03-12 ENCOUNTER — Telehealth: Payer: Self-pay | Admitting: *Deleted

## 2018-03-12 LAB — COMPREHENSIVE METABOLIC PANEL
ALBUMIN: 3.4 g/dL — AB (ref 3.5–4.7)
ALK PHOS: 83 IU/L (ref 39–117)
ALT: 61 IU/L — ABNORMAL HIGH (ref 0–44)
AST: 35 IU/L (ref 0–40)
Albumin/Globulin Ratio: 1.3 (ref 1.2–2.2)
BILIRUBIN TOTAL: 0.4 mg/dL (ref 0.0–1.2)
BUN / CREAT RATIO: 8 — AB (ref 10–24)
BUN: 7 mg/dL — AB (ref 8–27)
CHLORIDE: 103 mmol/L (ref 96–106)
CO2: 26 mmol/L (ref 20–29)
Calcium: 8.6 mg/dL (ref 8.6–10.2)
Creatinine, Ser: 0.89 mg/dL (ref 0.76–1.27)
GFR calc Af Amer: 90 mL/min/{1.73_m2} (ref >59)
GFR calc non Af Amer: 78 mL/min/{1.73_m2} (ref >59)
GLUCOSE: 89 mg/dL (ref 65–99)
Globulin, Total: 2.7 g/dL (ref 1.5–4.5)
POTASSIUM: 4.8 mmol/L (ref 3.5–5.2)
Sodium: 141 mmol/L (ref 134–144)
Total Protein: 6.1 g/dL (ref 6.0–8.5)

## 2018-03-12 LAB — VALPROIC ACID LEVEL: Valproic Acid Lvl: 47 ug/mL — ABNORMAL LOW (ref 50–100)

## 2018-03-12 NOTE — Telephone Encounter (Signed)
LVM informing patient his lab results are unremarkable. Left number for any questions.

## 2018-05-02 ENCOUNTER — Other Ambulatory Visit: Payer: Self-pay | Admitting: Physician Assistant

## 2018-05-02 DIAGNOSIS — E038 Other specified hypothyroidism: Secondary | ICD-10-CM

## 2018-06-05 ENCOUNTER — Other Ambulatory Visit: Payer: Self-pay | Admitting: Physician Assistant

## 2018-06-05 DIAGNOSIS — E038 Other specified hypothyroidism: Secondary | ICD-10-CM

## 2018-06-11 ENCOUNTER — Ambulatory Visit: Payer: Medicare Other | Admitting: Adult Health

## 2018-08-06 ENCOUNTER — Other Ambulatory Visit: Payer: Self-pay

## 2018-08-06 ENCOUNTER — Encounter: Payer: Self-pay | Admitting: Physician Assistant

## 2018-08-06 ENCOUNTER — Ambulatory Visit (INDEPENDENT_AMBULATORY_CARE_PROVIDER_SITE_OTHER): Payer: Medicare Other | Admitting: Physician Assistant

## 2018-08-06 ENCOUNTER — Ambulatory Visit: Payer: Medicare Other | Admitting: Physician Assistant

## 2018-08-06 VITALS — BP 145/88 | HR 77 | Temp 98.6°F | Resp 18 | Ht 75.0 in | Wt 183.0 lb

## 2018-08-06 DIAGNOSIS — E039 Hypothyroidism, unspecified: Secondary | ICD-10-CM | POA: Diagnosis not present

## 2018-08-06 DIAGNOSIS — G8929 Other chronic pain: Secondary | ICD-10-CM | POA: Diagnosis not present

## 2018-08-06 DIAGNOSIS — I10 Essential (primary) hypertension: Secondary | ICD-10-CM | POA: Diagnosis not present

## 2018-08-06 DIAGNOSIS — R7989 Other specified abnormal findings of blood chemistry: Secondary | ICD-10-CM

## 2018-08-06 MED ORDER — LEVOTHYROXINE SODIUM 112 MCG PO TABS: 112.0000 ug | ORAL_TABLET | Freq: Every day | ORAL | 2 refills | Status: DC

## 2018-08-06 NOTE — Progress Notes (Signed)
08/06/2018 1:44 PM   DOB: 1930-02-02 / MRN: [id number]  SUBJECTIVE:  Larry Cordova is a 84 y.o. male pleasantly demented gentleman hypothyroidism, HTN follow up, and arthritis presenting for medication refills. He is accompanied by his wife who is also his primary care taker. He is eating well. He sleeps well on most nights of the week.  He is compliant with Depakote per neurology recommendations and his last visit there was 12/12/17 where he got a relatively good report and no medications were charged. He is a falls risk. He is able to care for himself at home with the tasks of cleaning himself and changing clothes.    Current Outpatient Medications:  .  acetaminophen (TYLENOL) 500 MG tablet, Take 1,000 mg by mouth every 6 (six) hours as needed for mild pain or headache., Disp: , Rfl:  .  divalproex (DEPAKOTE ER) 250 MG 24 hr tablet, Take 2 tablets (500 mg total) by mouth at bedtime., Disp: 60 tablet, Rfl: 12 .  traMADol (ULTRAM) 50 MG tablet, take 1 tablet by mouth every 12 hours if needed for pain or severe pain, Disp: 60 tablet, Rfl: 3 .  levothyroxine (SYNTHROID, LEVOTHROID) 112 MCG tablet, Take 1 tablet (112 mcg total) by mouth daily. Come back to see Dr. Pamella Pert in March 2020., Disp: 90 tablet, Rfl: 2   He has No Known Allergies.   He  has a past medical history of Allergy, Arthritis, Depression, Hypertension, Kyphosis, Spondylolisthesis (03/18/2017), Thyroid disease, and Tinnitus.    He  reports that he has quit smoking. His smoking use included cigarettes, pipe, and cigars. He quit after 30.00 years of use. He has never used smokeless tobacco. He reports that he does not drink alcohol or use drugs. He  reports that he currently engages in sexual activity. The patient  has a past surgical history that includes Hernia repair.  His family history includes Kidney failure in his mother.  Review of Systems  Constitutional: Negative for chills, diaphoresis and fever.  Eyes: Negative.     Respiratory: Negative for cough, hemoptysis, sputum production, shortness of breath and wheezing.   Cardiovascular: Negative for chest pain, orthopnea and leg swelling.  Gastrointestinal: Negative for abdominal pain, blood in stool, constipation, diarrhea, heartburn, melena, nausea and vomiting.  Genitourinary: Negative for dysuria, flank pain, frequency, hematuria and urgency.  Skin: Negative for rash.  Neurological: Negative for dizziness, sensory change, speech change, focal weakness and headaches.    The problem list and medications were reviewed and updated by myself where necessary and exist elsewhere in the encounter.   OBJECTIVE:  BP (!) 145/88   Pulse 77   Temp 98.6 F (37 C) (Oral)   Resp 18   Ht 6\' 3"  (1.905 m)   Wt 183 lb (83 kg)   SpO2 94%   BMI 22.87 kg/m   Wt Readings from Last 3 Encounters:  08/06/18 183 lb (83 kg)  12/12/17 179 lb (81.2 kg)  08/07/17 182 lb 9.6 oz (82.8 kg)   Temp Readings from Last 3 Encounters:  08/06/18 98.6 F (37 C) (Oral)  11/26/17 97.7 F (36.5 C) (Oral)  08/07/17 97.6 F (36.4 C) (Oral)   BP Readings from Last 3 Encounters:  08/06/18 (!) 145/88  12/12/17 (!) 153/92  11/26/17 (!) 150/80   Pulse Readings from Last 3 Encounters:  08/06/18 77  12/12/17 71  11/26/17 65    Physical Exam  Constitutional: He is oriented to person, place, and time. He appears well-developed.  He does not appear ill.  Eyes: Pupils are equal, round, and reactive to light. Conjunctivae and EOM are normal.  Cardiovascular: Normal rate, regular rhythm, S1 normal, S2 normal, normal heart sounds, intact distal pulses and normal pulses. Exam reveals no gallop and no friction rub.  No murmur heard. Pulmonary/Chest: Effort normal. No stridor. No respiratory distress. He has no wheezes. He has no rales.  Abdominal: He exhibits no distension.  Musculoskeletal: Normal range of motion. He exhibits no edema.  Neurological: He is alert and oriented to person,  place, and time. No cranial nerve deficit. Coordination normal.  Skin: Skin is warm and dry. He is not diaphoretic.  Psychiatric: His mood appears not anxious. His affect is blunt, labile and inappropriate. His affect is not angry. His speech is delayed and tangential. His speech is not rapid and/or pressured and not slurred. He is not agitated, not aggressive, not hyperactive, not slowed, not withdrawn, not actively hallucinating and not combative. Thought content is not delusional. Cognition and memory are impaired. He expresses impulsivity and inappropriate judgment. He does not exhibit a depressed mood. He expresses no suicidal plans and no homicidal plans. He is communicative. He exhibits abnormal recent memory and abnormal remote memory. He is inattentive.  Nursing note and vitals reviewed.   Lab Results  Component Value Date   HGBA1C 5.5 08/07/2017    Lab Results  Component Value Date   WBC 9.5 12/12/2017   HGB 14.4 12/12/2017   HCT 42.5 12/12/2017   MCV 91 12/12/2017   PLT 234 12/12/2017    Lab Results  Component Value Date   CREATININE 0.89 03/11/2018   BUN 7 (L) 03/11/2018   NA 141 03/11/2018   K 4.8 03/11/2018   CL 103 03/11/2018   CO2 26 03/11/2018    Lab Results  Component Value Date   ALT 61 (H) 03/11/2018   AST 35 03/11/2018   ALKPHOS 83 03/11/2018   BILITOT 0.4 03/11/2018   Lab Results  Component Value Date   TSH 35.280 (H) 08/07/2017    Lab Results  Component Value Date   CHOL 179 07/29/2014   HDL 50 07/29/2014   LDLCALC 111 (H) 07/29/2014   TRIG 90 07/29/2014   CHOLHDL 3.6 07/29/2014     ASSESSMENT AND PLAN:  Terell was seen today for medication refill.  Diagnoses and all orders for this visit:  Hypothyroidism, unspecified type: Will eval TSH and recommend dose adjustment as needed.  I had ordered 112 earlier in the year however somehow this was changed back to 100 mcg.  Starting the 112 back today.  Follow up with Dr. Pamella Pert in about 6  months. Sooner if any acute change in behavior or physical health.  -     TSH -     levothyroxine (SYNTHROID, LEVOTHROID) 112 MCG tablet; Take 1 tablet (112 mcg total) by mouth daily. Come back to see Dr. Pamella Pert in March 2020.  Other chronic pain: Takes tylenol on most days.  Sometimes requires tramadol.  Wife distributes.   Essential hypertension Comments: Less than 150 over 90.  He is a falls risk.  Avoiding antihypertensives.     The patient is advised to call or return to clinic if he does not see an improvement in symptoms, or to seek the care of the closest emergency department if he worsens with the above plan.   Philis Fendt, MHS, PA-C [facility] 08/06/2018 1:44 PM

## 2018-08-06 NOTE — Patient Instructions (Signed)
° ° ° °  If you have lab work done today you will be contacted with your lab results within the next 2 weeks.  If you have not heard from us then please contact us. The fastest way to get your results is to register for My Chart. ° ° °IF you received an x-ray today, you will receive an invoice from [facility]. Please contact [facility] at [phone] with questions or concerns regarding your invoice.  ° °IF you received labwork today, you will receive an invoice from [facility]. Please contact [facility] at [phone] with questions or concerns regarding your invoice.  ° °Our billing staff will not be able to assist you with questions regarding bills from these companies. ° °You will be contacted with the lab results as soon as they are available. The fastest way to get your results is to activate your My Chart account. Instructions are located on the last page of this paperwork. If you have not heard from us regarding the results in 2 weeks, please contact this office. °  ° ° ° °

## 2018-08-07 LAB — TSH: TSH: 22.04 u[IU]/mL — ABNORMAL HIGH (ref 0.450–4.500)

## 2018-08-08 NOTE — Progress Notes (Signed)
Please add on a free t4.

## 2018-08-09 NOTE — Addendum Note (Signed)
Addended by: Gari Crown D on: 08/09/2018 10:37 AM   Modules accepted: Orders

## 2018-08-10 LAB — T4, FREE: Free T4: 1.12 ng/dL (ref 0.82–1.77)

## 2018-09-01 ENCOUNTER — Encounter: Payer: Self-pay | Admitting: Adult Health

## 2018-09-01 ENCOUNTER — Ambulatory Visit: Payer: Medicare Other | Admitting: Adult Health

## 2018-09-01 VITALS — BP 138/96 | HR 66 | Ht 75.0 in | Wt 186.0 lb

## 2018-09-01 DIAGNOSIS — F0391 Unspecified dementia with behavioral disturbance: Secondary | ICD-10-CM | POA: Diagnosis not present

## 2018-09-01 DIAGNOSIS — Z5181 Encounter for therapeutic drug level monitoring: Secondary | ICD-10-CM | POA: Diagnosis not present

## 2018-09-01 NOTE — Patient Instructions (Signed)
Your Plan:  Continue Depakote Blood work today Memory score is stable If your symptoms worsen or you develop new symptoms please let us know.   Thank you for coming to see [place] at [facility]. I hope we have been able to provide you high quality care today.  You may receive a patient satisfaction survey over the next few weeks. We would appreciate your feedback and comments so that we may continue to improve ourselves and the health of our patients.

## 2018-09-01 NOTE — Progress Notes (Signed)
Participated in, made any corrections needed, and agree with history, physical, neuro exam,assessment and plan as stated above.     Sarina Ill, MD [facility]

## 2018-09-01 NOTE — Progress Notes (Signed)
PATIENT: Larry Cordova DOB: [birth year]  REASON FOR VISIT: follow up HISTORY FROM: patient  HISTORY OF PRESENT ILLNESS: Today 09/01/18 Larry Cordova is an 84 year old male with a history of memory disturbance.  He returns today for follow-up.  He is here today with his wife.  He is able to complete all ADLs independently.  He does not operate a motor vehicle.  Denies any trouble sleeping.  Wife reports he has a good appetite.  She reports that the Depakote is working well.  Denies hallucinations.  She reports that he continues to talk all day long typically not related to the conversation at [place].  He returns today for evaluation.  HISTORY 12/12/17 Larry Cordova is an 84 year old male with a history of memory disturbance.  He returns today for follow-up.  He is currently taking Depakote for agitation.  His wife reports that this is been working well.  She feels that his memory has remained the same.  He is able to complete all ADLs independently.  He no longer operates a Teacher, music.  His wife handles all the finances and does all the cooking.  She reports that he sleeps okay but feels that this could be better.  His balance is slightly off.  He has not had any falls.  I did put a referral in for home health for physical therapy.  Denies any new neurological symptoms.  Returns today for an evaluation.  REVIEW OF SYSTEMS: Out of a complete 14 system review of symptoms, the patient complains only of the following symptoms, and all other reviewed systems are negative.  See HPI  ALLERGIES: No Known Allergies  HOME MEDICATIONS: Outpatient Medications Prior to Visit  Medication Sig Dispense Refill  . acetaminophen (TYLENOL) 500 MG tablet Take 1,000 mg by mouth every 6 (six) hours as needed for mild pain or headache.    Marland Kitchen aspirin EC 81 MG tablet Take 81 mg by mouth daily.    . divalproex (DEPAKOTE ER) 250 MG 24 hr tablet Take 2 tablets (500 mg total) by mouth at bedtime. 60 tablet 12  .  levothyroxine (SYNTHROID, LEVOTHROID) 112 MCG tablet Take 1 tablet (112 mcg total) by mouth daily. Come back to see Dr. Pamella Pert in March 2020. 90 tablet 2  . traMADol (ULTRAM) 50 MG tablet take 1 tablet by mouth every 12 hours if needed for pain or severe pain 60 tablet 3   No facility-administered medications prior to visit.     PAST MEDICAL HISTORY: Past Medical History:  Diagnosis Date  . Allergy   . Arthritis   . Depression   . Hypertension   . Kyphosis   . Spondylolisthesis 03/18/2017  . Thyroid disease   . Tinnitus     PAST SURGICAL HISTORY: Past Surgical History:  Procedure Laterality Date  . HERNIA REPAIR      FAMILY HISTORY: Family History  Problem Relation Age of Onset  . Kidney failure Mother   . Dementia Neg Hx     SOCIAL HISTORY: Social History   Socioeconomic History  . Marital status: Single    Spouse name: Not on file  . Number of children: Not on file  . Years of education: 10th grade  . Highest education level: Not on file  Occupational History  . Occupation: retired  Scientific laboratory technician  . Financial resource strain: Not on file  . Food insecurity:    Worry: Not on file    Inability: Not on file  . Transportation needs:  Medical: Not on file    Non-medical: Not on file  Tobacco Use  . Smoking status: Former Smoker    Years: 30.00    Types: Cigarettes, Pipe, Cigars  . Smokeless tobacco: Never Used  Substance and Sexual Activity  . Alcohol use: No  . Drug use: No  . Sexual activity: Yes  Lifestyle  . Physical activity:    Days per week: Not on file    Minutes per session: Not on file  . Stress: Not on file  Relationships  . Social connections:    Talks on phone: Not on file    Gets together: Not on file    Attends religious service: Not on file    Active member of club or organization: Not on file    Attends meetings of clubs or organizations: Not on file    Relationship status: Not on file  . Intimate partner violence:    Fear of  current or ex partner: Not on file    Emotionally abused: Not on file    Physically abused: Not on file    Forced sexual activity: Not on file  Other Topics Concern  . Not on file  Social History Narrative   Lives with wife   Caffeine use: none      PHYSICAL EXAM  Vitals:   09/01/18 1301  BP: (!) 138/96  Pulse: 66  Weight: 186 lb (84.4 kg)  Height: 6\' 3"  (1.905 m)   Body mass index is 23.25 kg/m.   MMSE - Mini Mental State Exam 09/01/2018 12/12/2017 01/01/2017  Orientation to time 2 0 4  Orientation to Place 5 4 4   Registration 3 3 3   Attention/ Calculation 2 1 0  Recall 0 0 0  Language- name 2 objects 2 2 2   Language- repeat 0 0 1  Language- follow 3 step command 2 3 2   Language- read & follow direction 1 1 1   Write a sentence 1 1 0  Copy design 0 0 0  Total score 18 15 17      Generalized: Well developed, in no acute distress   Neurological examination  Mentation: Alert oriented to time, place, history taking. Follows all commands speech and language fluent Cranial nerve II-XII: Pupils were equal round reactive to light. Extraocular movements were full, visual field were full on confrontational test. Facial sensation and strength were normal. Uvula tongue midline. Head turning and shoulder shrug  were normal and symmetric. Motor: The motor testing reveals 5 over 5 strength of all 4 extremities. Good symmetric motor tone is noted throughout.  Sensory: Sensory testing is intact to soft touch on all 4 extremities. No evidence of extinction is noted.  Coordination: Cerebellar testing reveals good finger-nose-finger and heel-to-shin bilaterally.  Gait and station: Gait is normal.  Reflexes: Deep tendon reflexes are symmetric and normal bilaterally.   DIAGNOSTIC DATA (LABS, IMAGING, TESTING) - I reviewed patient records, labs, notes, testing and imaging myself where available.  Lab Results  Component Value Date   WBC 9.5 12/12/2017   HGB 14.4 12/12/2017   HCT 42.5  12/12/2017   MCV 91 12/12/2017   PLT 234 12/12/2017      Component Value Date/Time   NA 141 03/11/2018 0943   K 4.8 03/11/2018 0943   CL 103 03/11/2018 0943   CO2 26 03/11/2018 0943   GLUCOSE 89 03/11/2018 0943   GLUCOSE 96 07/29/2014 0906   BUN 7 (L) 03/11/2018 0943   CREATININE 0.89 03/11/2018 0943   CREATININE  0.94 07/29/2014 0906   CALCIUM 8.6 03/11/2018 0943   PROT 6.1 03/11/2018 0943   ALBUMIN 3.4 (L) 03/11/2018 0943   AST 35 03/11/2018 0943   ALT 61 (H) 03/11/2018 0943   ALKPHOS 83 03/11/2018 0943   BILITOT 0.4 03/11/2018 0943   GFRNONAA 78 03/11/2018 0943   GFRNONAA 82 05/11/2014 1231   GFRAA 90 03/11/2018 0943   GFRAA >89 05/11/2014 1231   Lab Results  Component Value Date   CHOL 179 07/29/2014   HDL 50 07/29/2014   LDLCALC 111 (H) 07/29/2014   TRIG 90 07/29/2014   CHOLHDL 3.6 07/29/2014   Lab Results  Component Value Date   HGBA1C 5.5 08/07/2017   Lab Results  Component Value Date   VKPQAESL75 300 01/01/2017   Lab Results  Component Value Date   TSH 22.040 (H) 08/06/2018      ASSESSMENT AND PLAN 84 y.o. year old male  has a past medical history of Allergy, Arthritis, Depression, Hypertension, Kyphosis, Spondylolisthesis (03/18/2017), Thyroid disease, and Tinnitus. here with:  1.  Memory disturbance  The patient's memory score has remained stable.  We will continue to monitor over time.  He will continue on Depakote.  I will check blood work today.  He is advised that if his symptoms worsen or he develops new symptoms he should let us know.  We will follow-up in 6 months or sooner if needed.  I spent 15 minutes with the patient. 50% of this time was spent reviewing his memory score   Ward Givens, MSN, NP-C 09/01/2018, 1:20 PM [facility] [address], [place], [place] [address] [phone]

## 2018-09-02 ENCOUNTER — Telehealth: Payer: Self-pay | Admitting: *Deleted

## 2018-09-02 LAB — CBC WITH DIFFERENTIAL/PLATELET
BASOS: 1 %
Basophils Absolute: 0.1 10*3/uL (ref 0.0–0.2)
EOS (ABSOLUTE): 0.4 10*3/uL (ref 0.0–0.4)
Eos: 5 %
HEMATOCRIT: 43.2 % (ref 37.5–51.0)
Hemoglobin: 14.5 g/dL (ref 13.0–17.7)
Immature Grans (Abs): 0 10*3/uL (ref 0.0–0.1)
Immature Granulocytes: 1 %
LYMPHS ABS: 2.3 10*3/uL (ref 0.7–3.1)
Lymphs: 28 %
MCH: 30.6 pg (ref 26.6–33.0)
MCHC: 33.6 g/dL (ref 31.5–35.7)
MCV: 91 fL (ref 79–97)
MONOS ABS: 0.8 10*3/uL (ref 0.1–0.9)
Monocytes: 10 %
Neutrophils Absolute: 4.7 10*3/uL (ref 1.4–7.0)
Neutrophils: 55 %
PLATELETS: 178 10*3/uL (ref 150–450)
RBC: 4.74 x10E6/uL (ref 4.14–5.80)
RDW: 13.1 % (ref 12.3–15.4)
WBC: 8.3 10*3/uL (ref 3.4–10.8)

## 2018-09-02 LAB — COMPREHENSIVE METABOLIC PANEL
A/G RATIO: 1.4 (ref 1.2–2.2)
ALBUMIN: 3.8 g/dL (ref 3.5–4.7)
ALK PHOS: 74 IU/L (ref 39–117)
ALT: 21 IU/L (ref 0–44)
AST: 27 IU/L (ref 0–40)
BUN / CREAT RATIO: 10 (ref 10–24)
BUN: 10 mg/dL (ref 8–27)
Bilirubin Total: 0.5 mg/dL (ref 0.0–1.2)
CO2: 25 mmol/L (ref 20–29)
Calcium: 8.9 mg/dL (ref 8.6–10.2)
Chloride: 101 mmol/L (ref 96–106)
Creatinine, Ser: 0.97 mg/dL (ref 0.76–1.27)
GFR calc Af Amer: 81 mL/min/{1.73_m2} (ref >59)
GFR calc non Af Amer: 70 mL/min/{1.73_m2} (ref >59)
GLOBULIN, TOTAL: 2.8 g/dL (ref 1.5–4.5)
Glucose: 81 mg/dL (ref 65–99)
POTASSIUM: 4.3 mmol/L (ref 3.5–5.2)
SODIUM: 141 mmol/L (ref 134–144)
Total Protein: 6.6 g/dL (ref 6.0–8.5)

## 2018-09-02 LAB — VALPROIC ACID LEVEL: Valproic Acid Lvl: 46 ug/mL — ABNORMAL LOW (ref 50–100)

## 2018-09-02 NOTE — Telephone Encounter (Signed)
LVM informing patient that his lab work is unremarkable.  Advised there are no medication changes. Left number for any questions.

## 2018-12-27 ENCOUNTER — Other Ambulatory Visit: Payer: Self-pay | Admitting: Adult Health

## 2018-12-27 DIAGNOSIS — F0391 Unspecified dementia with behavioral disturbance: Secondary | ICD-10-CM

## 2019-02-06 ENCOUNTER — Ambulatory Visit: Payer: Medicare Other | Admitting: Family Medicine

## 2019-03-14 ENCOUNTER — Other Ambulatory Visit: Payer: Self-pay | Admitting: Adult Health

## 2019-03-14 DIAGNOSIS — F0391 Unspecified dementia with behavioral disturbance: Secondary | ICD-10-CM

## 2019-03-18 ENCOUNTER — Ambulatory Visit: Payer: Medicare Other | Admitting: Family Medicine

## 2019-05-10 ENCOUNTER — Other Ambulatory Visit: Payer: Self-pay | Admitting: Physician Assistant

## 2019-05-10 DIAGNOSIS — E039 Hypothyroidism, unspecified: Secondary | ICD-10-CM

## 2019-05-12 ENCOUNTER — Telehealth: Payer: Self-pay

## 2019-05-12 NOTE — Telephone Encounter (Signed)
Unable to get in contact with the patient to convert their office visit with Amy on 05/18/2019 into a doxy.me visit. I left a voicemail asking the patient to return my call. Office number was provided.   If patient calls back please convert their office visit into a doxy.me visit.

## 2019-05-12 NOTE — Telephone Encounter (Signed)
Sure

## 2019-05-12 NOTE — Telephone Encounter (Signed)
Pt's wife called back and stated she does not have the capability to do a Virtual Visit. Pt's wife would like to know if they can do a Tele Visit instead She states the pt is doing well. Please advise.

## 2019-05-12 NOTE — Telephone Encounter (Signed)
Unable to get in contact with the patient. I left a voicemail letting him know that his visit will be a telephone visit with Amy. Office number was provided in case he has any further questions.

## 2019-05-16 NOTE — Telephone Encounter (Signed)
Made appt June 15th for med refill/TOC

## 2019-05-18 ENCOUNTER — Ambulatory Visit: Payer: Medicare Other | Admitting: Adult Health

## 2019-05-18 ENCOUNTER — Encounter: Payer: Self-pay | Admitting: Family Medicine

## 2019-05-18 ENCOUNTER — Ambulatory Visit (INDEPENDENT_AMBULATORY_CARE_PROVIDER_SITE_OTHER): Payer: Medicare Other | Admitting: Family Medicine

## 2019-05-18 ENCOUNTER — Other Ambulatory Visit: Payer: Self-pay

## 2019-05-18 DIAGNOSIS — F0391 Unspecified dementia with behavioral disturbance: Secondary | ICD-10-CM

## 2019-05-18 DIAGNOSIS — F03918 Unspecified dementia, unspecified severity, with other behavioral disturbance: Secondary | ICD-10-CM | POA: Insufficient documentation

## 2019-05-18 NOTE — Progress Notes (Signed)
   PATIENT: Larry Cordova DOB: [birth year]  REASON FOR VISIT: follow up HISTORY FROM: patient  Virtual Visit via Telephone Note  I connected with Ashraf R Pounders on 05/18/19 at  2:00 PM EDT by telephone and verified that I am speaking with the correct person using two identifiers.   I discussed the limitations, risks, security and privacy concerns of performing an evaluation and management service by telephone and the availability of in person appointments. I also discussed with the patient that there may be a patient responsible charge related to this service. The patient expressed understanding and agreed to proceed.   History of Present Illness:  05/18/19 Larry Cordova is a 85 y.o. male here today for follow up of dementia.  Mr. Larzelere states that he is feeling well.  Mrs. Wickstrom assist in obtaining history as Mr. Larry Cordova is hard of hearing.  She reports that he is doing very well on Depakote 500 mg at night.  He is tolerating this medication well.  She states that behavioral component is well controlled.  He is not nearly as agitated as he was.  Memory is stable.  He does not drive.  He is able to dress and bathe himself.  She denies any falls.  He has regular follow-up with primary care provider.   Observations/Objective:  Generalized: Well developed, in no acute distress  Mentation: Alert oriented to time, place, history taking. Follows all commands speech and language fluent   Assessment and Plan:  85 y.o. year old male  has a past medical history of Allergy, Arthritis, Depression, Hypertension, Kyphosis, Spondylolisthesis (03/18/2017), Thyroid disease, and Tinnitus. here with    ICD-10-CM   1. Dementia with behavioral disturbance, unspecified dementia type ([place]) F03.91      Mr. Larry Cordova continues to be fairly stable with memory on Depakote.  He is tolerating medication well and Mrs. Schonberg reports that it clearly helps with agitation.  We will continue current therapy.  I do not feel  that Aricept or Namenda would be beneficial at this point or change, outcomes clinically.  I have scheduled Mr. Larry Cordova follow-up with me in 6 months.  Mrs. Piasecki verbalizes understanding and agreement with the plan.  No orders of the defined types were placed in this encounter.   No orders of the defined types were placed in this encounter.    Follow Up Instructions:  I discussed the assessment and treatment plan with the patient. The patient was provided an opportunity to ask questions and all were answered. The patient agreed with the plan and demonstrated an understanding of the instructions.   The patient was advised to call back or seek an in-person evaluation if the symptoms worsen or if the condition fails to improve as anticipated.  I provided 25 minutes of non-face-to-face time during this encounter.  Patient and his wife are located at their place of residence during teleconference.  Provider is located at her place of residence.  Maryelizabeth Kaufmann, CMA helped to facilitate visit.   Debbora Presto, NP

## 2019-05-18 NOTE — Telephone Encounter (Signed)
Spoke with the patient's wife and she has given verbal consent to file his insurance and to do a telephone visit with Debbora Presto, NP.

## 2019-05-22 NOTE — Progress Notes (Signed)
Made any corrections needed, and agree with history, physical, neuro exam,assessment and plan as stated.     Antonia Ahern, MD [facility]  

## 2019-05-25 ENCOUNTER — Encounter: Payer: Medicare Other | Admitting: Family Medicine

## 2019-06-09 ENCOUNTER — Telehealth: Payer: Self-pay | Admitting: Family Medicine

## 2019-06-09 NOTE — Telephone Encounter (Signed)
Noted.  He is followed by neurology with recent visit for dementia management. I have not seen him, but he has upcoming appointment with me and can eval for PAD at that time.

## 2019-06-09 NOTE — Telephone Encounter (Signed)
Physical Therapist called to give the following results:  The patient does show moderate Peripheral Artery Disease. His right foot was at 0.85 and left foot was at 0.89.   They also did a mini cognitive test and it shows dementia.

## 2019-06-12 ENCOUNTER — Other Ambulatory Visit: Payer: Self-pay | Admitting: Adult Health

## 2019-06-12 DIAGNOSIS — F0391 Unspecified dementia with behavioral disturbance: Secondary | ICD-10-CM

## 2019-06-25 ENCOUNTER — Encounter: Payer: Self-pay | Admitting: Family Medicine

## 2019-06-25 ENCOUNTER — Other Ambulatory Visit: Payer: Self-pay

## 2019-06-25 ENCOUNTER — Ambulatory Visit (INDEPENDENT_AMBULATORY_CARE_PROVIDER_SITE_OTHER): Payer: Medicare Other | Admitting: Family Medicine

## 2019-06-25 VITALS — BP 148/92 | HR 79 | Temp 98.2°F | Resp 12 | Wt 189.2 lb

## 2019-06-25 DIAGNOSIS — I739 Peripheral vascular disease, unspecified: Secondary | ICD-10-CM

## 2019-06-25 DIAGNOSIS — R030 Elevated blood-pressure reading, without diagnosis of hypertension: Secondary | ICD-10-CM

## 2019-06-25 DIAGNOSIS — E039 Hypothyroidism, unspecified: Secondary | ICD-10-CM

## 2019-06-25 MED ORDER — LISINOPRIL 5 MG PO TABS: 2.5000 mg | ORAL_TABLET | Freq: Every day | ORAL | 1 refills | Status: DC

## 2019-06-25 MED ORDER — LEVOTHYROXINE SODIUM 112 MCG PO TABS: 112.0000 ug | ORAL_TABLET | Freq: Every day | ORAL | 0 refills | Status: DC

## 2019-06-25 NOTE — Progress Notes (Signed)
Subjective:    Patient ID: Larry Cordova, male    DOB: May 16, 1930, 85 y.o.   MRN: [id number]  HPI Larry Cordova is a 85 y.o. male Presents today for: Chief Complaint  Patient presents with  . Establish Care    Patient is doing well here to est care. Has been seen by [facility] nurse once ayear and they stated his bp was a little elevated.  New patient to me to establish care.  He is followed by neurology for dementia with behavioral disturbance, last visit June 8.  Amy Lomax.  Treated with Depakote that helps with agitation.  Aricept and Namenda was thought to not be beneficial at this point for change in outcome clinically so was not started.  Plan for recheck in 6 months.    History of hypothyroidism on Synthroid.  No recent testing. Lab Results  Component Value Date   TSH 22.040 (H) 08/06/2018  free t4 was normal.  Dry skin chronically, no other skin or hair changes.  No palpitations.  Taking synthroid 17mcg qd.   Elevated blood pressure: Noted by nurse from [place] [facility]. 158/90on 06/09/19 assessment. Moderate PAD noted. ABI .85R, .890.89on left.  No foot pain, no numbness. No skin color changes. Thick/dislored nails.  Not currently on medication for blood pressure.  Elevated BP discussed in August 2019 with goal of less than 150/90 given age and fall risk. BP Readings from Last 3 Encounters:  06/25/19 (!) 148/92  09/01/18 (!) 138/96  08/06/18 (!) 145/88     Patient Active Problem List   Diagnosis Date Noted  . Dementia with behavioral disturbance ([place]) 05/18/2019  . Spondylolisthesis 03/18/2017  . Frontotemporal dementia ([place]) 01/03/2017  . Skin cancer 07/29/2014  . Other and unspecified hyperlipidemia 07/29/2014  . Arthritis 07/13/2013  . Hypothyroid 06/16/2012  . HTN (hypertension) 06/16/2012   Past Medical History:  Diagnosis Date  . Allergy   . Arthritis   . Depression   . Hypertension   . Kyphosis   . Spondylolisthesis 03/18/2017  . Thyroid disease   .  Tinnitus    Past Surgical History:  Procedure Laterality Date  . HERNIA REPAIR     No Known Allergies Prior to Admission medications   Medication Sig Start Date End Date Taking? Authorizing Provider  acetaminophen (TYLENOL) 500 MG tablet Take 1,000 mg by mouth every 6 (six) hours as needed for mild pain or headache.   Yes [provider]  aspirin EC 81 MG tablet Take 81 mg by mouth daily.   Yes [provider]  divalproex (DEPAKOTE ER) 250 MG 24 hr tablet TAKE 2 TABLETS(500 MG) BY MOUTH AT BEDTIME 06/15/19  Yes Lomax, Amy, NP  levothyroxine (SYNTHROID) 112 MCG tablet TAKE 1 TABLET BY MOUTH DAILY 05/11/19  Yes Rutherford Guys, MD   Social History   Socioeconomic History  . Marital status: Single    Spouse name: Not on file  . Number of children: Not on file  . Years of education: 10th grade  . Highest education level: Not on file  Occupational History  . Occupation: retired  Scientific laboratory technician  . Financial resource strain: Not on file  . Food insecurity    Worry: Not on file    Inability: Not on file  . Transportation needs    Medical: Not on file    Non-medical: Not on file  Tobacco Use  . Smoking status: Former Smoker    Years: 30.00    Types: Cigarettes, Pipe, Cigars  .  Smokeless tobacco: Never Used  Substance and Sexual Activity  . Alcohol use: No  . Drug use: No  . Sexual activity: Yes  Lifestyle  . Physical activity    Days per week: Not on file    Minutes per session: Not on file  . Stress: Not on file  Relationships  . Social Herbalist on phone: Not on file    Gets together: Not on file    Attends religious service: Not on file    Active member of club or organization: Not on file    Attends meetings of clubs or organizations: Not on file    Relationship status: Not on file  . Intimate partner violence    Fear of current or ex partner: Not on file    Emotionally abused: Not on file    Physically abused: Not on file    Forced sexual  activity: Not on file  Other Topics Concern  . Not on file  Social History Narrative   Lives with wife   Caffeine use: none    Review of Systems Per HPI.      Objective:   Physical Exam Vitals signs reviewed.  Constitutional:      Appearance: He is well-developed.  HENT:     Head: Normocephalic and atraumatic.  Eyes:     Pupils: Pupils are equal, round, and reactive to light.  Neck:     Vascular: No carotid bruit or JVD.  Cardiovascular:     Rate and Rhythm: Normal rate and regular rhythm.     Heart sounds: Normal heart sounds. No murmur.  Pulmonary:     Effort: Pulmonary effort is normal.     Breath sounds: Normal breath sounds. No rales.  Musculoskeletal:        General: No tenderness.     Right lower leg: No edema.     Left lower leg: No edema.  Skin:    General: Skin is warm and dry.     Coloration: Skin is pale (cool feet bilaterally with cap refill approx 3-5s. dry skin of feet, thick/discolored nails, with left great toenail loose distally. ).     Findings: No rash (no foot ulceration/lesions. ).  Neurological:     Mental Status: He is alert and oriented to person, place, and time.    Vitals:   06/25/19 1059  BP: (!) 148/92  Pulse: 79  Resp: 12  Temp: 98.2 F (36.8 C)  TempSrc: Oral  SpO2: 97%  Weight: 189 lb 3.2 oz (85.8 kg)         Assessment & Plan:   Larry Cordova is a 85 y.o. male Hypothyroidism, unspecified type - Plan: TSH + free T4, levothyroxine (SYNTHROID) 112 MCG tablet  -Prior elevated TSH, repeat testing, continue Synthroid same dose for now, temporary refill given  Elevated blood pressure reading - Plan: Basic metabolic panel, lisinopril (ZESTRIL) 5 MG tablet  -Persistent elevated diastolic, and with suspected PAD will start low-dose ACE inhibitor with lisinopril 2.5 mg daily.  Recheck next 6 weeks for repeat creatinine.  PAD (peripheral artery disease) ([place]) - Plan: Ambulatory referral to Vascular Surgery, Basic metabolic panel,  lisinopril (ZESTRIL) 5 MG tablet  -Suspected chronic peripheral arterial disease, no specific reports of claudication symptoms, no ulcers or signs of critical limb ischemia present but will refer to vascular specialist for further evaluation.   Meds ordered this encounter  Medications  . levothyroxine (SYNTHROID) 112 MCG tablet    Sig: Take  1 tablet (112 mcg total) by mouth daily.    Dispense:  30 tablet    Refill:  0  . lisinopril (ZESTRIL) 5 MG tablet    Sig: Take 0.5 tablets (2.5 mg total) by mouth daily.    Dispense:  30 tablet    Refill:  1   Patient Instructions   Continue same dose of Synthroid for now, but I will check thyroid test as elevated last August. Blood pressure is running a little bit high, and may need to be on a low-dose of blood pressure medicine.  Start lisinopril 2.5 mg once per day for right now, and recheck in next 6 weeks I will refer you to vascular specialist to evaluate circulation in your feet.   Return to the clinic or go to the nearest emergency room if any of your symptoms worsen or new symptoms occur.  If you have lab work done today you will be contacted with your lab results within the next 2 weeks.  If you have not heard from [place] then please contact us. The fastest way to get your results is to register for My Chart.   IF you received an x-ray today, you will receive an invoice from [facility]. Please contact [facility] at [phone] with questions or concerns regarding your invoice.   IF you received labwork today, you will receive an invoice from [facility]. Please contact [facility] at [phone] with questions or concerns regarding your invoice.   Our billing staff will not be able to assist you with questions regarding bills from these companies.  You will be contacted with the lab results as soon as they are available. The fastest way to get your results is to activate your My Chart account. Instructions are located on the  last page of this paperwork. If you have not heard from [place] regarding the results in 2 weeks, please contact this office.       Signed,   Merri Ray, MD [facility].  06/25/19 9:56 PM

## 2019-06-25 NOTE — Patient Instructions (Addendum)
Continue same dose of Synthroid for now, but I will check thyroid test as elevated last August. Blood pressure is running a little bit high, and may need to be on a low-dose of blood pressure medicine.  Start lisinopril 2.5 mg once per day for right now, and recheck in next 6 weeks I will refer you to vascular specialist to evaluate circulation in your feet.   Return to the clinic or go to the nearest emergency room if any of your symptoms worsen or new symptoms occur.  If you have lab work done today you will be contacted with your lab results within the next 2 weeks.  If you have not heard from [place] then please contact us. The fastest way to get your results is to register for My Chart.   IF you received an x-ray today, you will receive an invoice from [facility]. Please contact [facility] at [phone] with questions or concerns regarding your invoice.   IF you received labwork today, you will receive an invoice from [facility]. Please contact [facility] at [phone] with questions or concerns regarding your invoice.   Our billing staff will not be able to assist you with questions regarding bills from these companies.  You will be contacted with the lab results as soon as they are available. The fastest way to get your results is to activate your My Chart account. Instructions are located on the last page of this paperwork. If you have not heard from [place] regarding the results in 2 weeks, please contact this office.

## 2019-06-26 LAB — BASIC METABOLIC PANEL
BUN/Creatinine Ratio: 8 — ABNORMAL LOW (ref 10–24)
BUN: 9 mg/dL (ref 8–27)
CO2: 25 mmol/L (ref 20–29)
Calcium: 8.9 mg/dL (ref 8.6–10.2)
Chloride: 101 mmol/L (ref 96–106)
Creatinine, Ser: 1.08 mg/dL (ref 0.76–1.27)
GFR calc Af Amer: 71 mL/min/{1.73_m2} (ref >59)
GFR calc non Af Amer: 62 mL/min/{1.73_m2} (ref >59)
Glucose: 84 mg/dL (ref 65–99)
Potassium: 4.5 mmol/L (ref 3.5–5.2)
Sodium: 139 mmol/L (ref 134–144)

## 2019-06-26 LAB — TSH+FREE T4
Free T4: 1.35 ng/dL (ref 0.82–1.77)
TSH: 4.04 u[IU]/mL (ref 0.450–4.500)

## 2019-07-01 ENCOUNTER — Encounter (HOSPITAL_COMMUNITY): Payer: Self-pay | Admitting: Radiology

## 2019-07-08 ENCOUNTER — Other Ambulatory Visit: Payer: Self-pay | Admitting: Family Medicine

## 2019-07-08 DIAGNOSIS — I739 Peripheral vascular disease, unspecified: Secondary | ICD-10-CM

## 2019-07-08 DIAGNOSIS — R030 Elevated blood-pressure reading, without diagnosis of hypertension: Secondary | ICD-10-CM

## 2019-07-08 MED ORDER — LISINOPRIL 5 MG PO TABS: 2.5000 mg | ORAL_TABLET | Freq: Every day | ORAL | 0 refills | Status: DC

## 2019-07-08 NOTE — Telephone Encounter (Signed)
[facility] is requesting #[phone] for lisinopril 5 mg per insurance

## 2019-07-08 NOTE — Telephone Encounter (Signed)
RX sent to pharmacy  

## 2019-08-02 ENCOUNTER — Other Ambulatory Visit: Payer: Self-pay | Admitting: Family Medicine

## 2019-08-02 DIAGNOSIS — E039 Hypothyroidism, unspecified: Secondary | ICD-10-CM

## 2019-08-06 ENCOUNTER — Ambulatory Visit: Payer: Medicare Other | Admitting: Family Medicine

## 2019-08-12 ENCOUNTER — Other Ambulatory Visit: Payer: Self-pay

## 2019-08-12 DIAGNOSIS — I739 Peripheral vascular disease, unspecified: Secondary | ICD-10-CM

## 2019-08-18 ENCOUNTER — Encounter: Payer: Self-pay | Admitting: Vascular Surgery

## 2019-08-18 ENCOUNTER — Ambulatory Visit (HOSPITAL_COMMUNITY)
Admission: RE | Admit: 2019-08-18 | Discharge: 2019-08-18 | Disposition: A | Discharge status: Home / Self Care | Payer: Medicare Other | Source: Ambulatory Visit | Attending: Family | Admitting: Family

## 2019-08-18 ENCOUNTER — Other Ambulatory Visit: Payer: Self-pay

## 2019-08-18 ENCOUNTER — Ambulatory Visit (INDEPENDENT_AMBULATORY_CARE_PROVIDER_SITE_OTHER): Payer: Medicare Other | Admitting: Vascular Surgery

## 2019-08-18 DIAGNOSIS — I739 Peripheral vascular disease, unspecified: Secondary | ICD-10-CM | POA: Diagnosis not present

## 2019-08-18 NOTE — Progress Notes (Signed)
Patient name: Larry Cordova MRN: [id number] DOB: 27-Nov-1930 Sex: male  REASON FOR CONSULT: Evaluate for PAD  HPI: Larry Cordova is a 85 y.o. male, with history of hypertension, depression and dementia that presents for evaluation of PAD.  In reviewing the notes it looks like he was evaluated by his PCP and had ABI of 0.85 on the right and 0.89 on the left which are only mildly abnormal.  He had no complaints at the time but was referred to vascular surgery for further evaluation.  On evaluation today he denies any lower extremity discomfort.  He walks with a cane and is here with his wife and states he can walk until he gives out and his legs do not bother him.  In addition to no claudication symptoms he has no rest pain or tissue loss.  His wife states that his dementia has progressed he often is hard to understand.  No previous vascular interventions.  Past Medical History:  Diagnosis Date  . Allergy   . Arthritis   . Depression   . Hypertension   . Kyphosis   . Spondylolisthesis 03/18/2017  . Thyroid disease   . Tinnitus     Past Surgical History:  Procedure Laterality Date  . HERNIA REPAIR      Family History  Problem Relation Age of Onset  . Kidney failure Mother   . Dementia Neg Hx     SOCIAL HISTORY: Social History   Socioeconomic History  . Marital status: Single    Spouse name: Not on file  . Number of children: Not on file  . Years of education: 10th grade  . Highest education level: Not on file  Occupational History  . Occupation: retired  Scientific laboratory technician  . Financial resource strain: Not on file  . Food insecurity    Worry: Not on file    Inability: Not on file  . Transportation needs    Medical: Not on file    Non-medical: Not on file  Tobacco Use  . Smoking status: Former Smoker    Years: 30.00    Types: Cigarettes, Pipe, Cigars  . Smokeless tobacco: Never Used  Substance and Sexual Activity  . Alcohol use: No  . Drug use: No  . Sexual  activity: Yes  Lifestyle  . Physical activity    Days per week: Not on file    Minutes per session: Not on file  . Stress: Not on file  Relationships  . Social Herbalist on phone: Not on file    Gets together: Not on file    Attends religious service: Not on file    Active member of club or organization: Not on file    Attends meetings of clubs or organizations: Not on file    Relationship status: Not on file  . Intimate partner violence    Fear of current or ex partner: Not on file    Emotionally abused: Not on file    Physically abused: Not on file    Forced sexual activity: Not on file  Other Topics Concern  . Not on file  Social History Narrative   Lives with wife   Caffeine use: none    No Known Allergies  Current Outpatient Medications  Medication Sig Dispense Refill  . acetaminophen (TYLENOL) 500 MG tablet Take 1,000 mg by mouth every 6 (six) hours as needed for mild pain or headache.    Marland Kitchen aspirin EC 81 MG tablet Take  81 mg by mouth daily.    . divalproex (DEPAKOTE ER) 250 MG 24 hr tablet TAKE 2 TABLETS(500 MG) BY MOUTH AT BEDTIME 180 tablet 1  . levothyroxine (SYNTHROID) 112 MCG tablet TAKE 1 TABLET(112 MCG) BY MOUTH DAILY 30 tablet 0  . lisinopril (ZESTRIL) 5 MG tablet Take 0.5 tablets (2.5 mg total) by mouth daily. 90 tablet 0   No current facility-administered medications for this visit.     REVIEW OF SYSTEMS:  [X]  denotes positive finding, [ ]  denotes negative finding Cardiac  Comments:  Chest pain or chest pressure:    Shortness of breath upon exertion:    Short of breath when lying flat:    Irregular heart rhythm:        Vascular    Pain in calf, thigh, or hip brought on by ambulation:    Pain in feet at night that wakes you up from your sleep:     Blood clot in your veins:    Leg swelling:         Pulmonary    Oxygen at home:    Productive cough:     Wheezing:         Neurologic    Sudden weakness in arms or legs:     Sudden  numbness in arms or legs:     Sudden onset of difficulty speaking or slurred speech:    Temporary loss of vision in one eye:     Problems with dizziness:         Gastrointestinal    Blood in stool:     Vomited blood:         Genitourinary    Burning when urinating:     Blood in urine:        Psychiatric    Major depression:         Hematologic    Bleeding problems:    Problems with blood clotting too easily:        Skin    Rashes or ulcers:        Constitutional    Fever or chills:      PHYSICAL EXAM: Vitals:   08/18/19 1155  BP: 134/84  Pulse: 72  Resp: 12  Temp: 97.8 F (36.6 C)  TempSrc: Temporal  SpO2: 97%  Weight: 189 lb (85.7 kg)  Height: 6\' 3"  (1.905 m)    GENERAL: The patient is a well-nourished male, in no acute distress. The vital signs are documented above. CARDIAC: There is a regular rate and rhythm.  VASCULAR:  2+ radial pulse palpable bilaterally 2+ femoral pulse palpable bilaterally Brisk DP/PT signals bilaterally - triphasic PULMONARY: There is good air exchange bilaterally without wheezing or rales. ABDOMEN: Soft and non-tender with normal pitched bowel sounds.  MUSCULOSKELETAL: There are no major deformities or cyanosis. NEUROLOGIC: No focal weakness or paresthesias are detected. SKIN: There are no ulcers or rashes noted. PSYCHIATRIC: The patient has a normal affect.  DATA:   ABIs here 1.22 on the right triphasic and 1.24 on the left triphasic  Assessment/Plan:  85 year old male who presents as a referral for evaluation of PAD.  His ABIs here in our clinic today were essentially normal.  On exam he had very brisk triphasic posterior tibial and dorsalis pedis signals.  He is completely asymptomatic and denies any claudication rest pain tissue loss or other symptoms that would warrant intervention.  I discussed in detail with him and his wife that I do not think he needs any  surgical intervention from my standpoint given his exam as well as  his noninvasive imaging here.  I offered him PRN follow-up in the future and discussed that if he feels like he starts having new symptoms consistent with claudication rest pain etc. we would be happy to reevaluate any time.   Marty Heck, MD [facility] Office: [phone] Pager: [phone]

## 2019-09-09 ENCOUNTER — Other Ambulatory Visit: Payer: Self-pay | Admitting: Family Medicine

## 2019-09-09 DIAGNOSIS — E039 Hypothyroidism, unspecified: Secondary | ICD-10-CM

## 2019-10-14 ENCOUNTER — Ambulatory Visit: Payer: Medicare Other | Admitting: Podiatry

## 2019-11-18 ENCOUNTER — Ambulatory Visit: Payer: Medicare Other | Admitting: Family Medicine

## 2019-12-29 ENCOUNTER — Ambulatory Visit: Payer: Medicare Other | Admitting: Podiatry

## 2019-12-29 ENCOUNTER — Other Ambulatory Visit: Payer: Self-pay | Admitting: Neurology

## 2019-12-29 DIAGNOSIS — F0391 Unspecified dementia with behavioral disturbance: Secondary | ICD-10-CM

## 2019-12-29 MED ORDER — DIVALPROEX SODIUM ER 250 MG PO TB24: ORAL_TABLET | ORAL | 0 refills | Status: DC

## 2020-01-11 ENCOUNTER — Ambulatory Visit: Payer: Medicare Other | Admitting: Family Medicine

## 2020-01-11 ENCOUNTER — Other Ambulatory Visit: Payer: Self-pay

## 2020-01-11 ENCOUNTER — Encounter: Payer: Self-pay | Admitting: Family Medicine

## 2020-01-11 VITALS — BP 130/73 | HR 69 | Temp 97.9°F | Ht 75.0 in | Wt 175.2 lb

## 2020-01-11 DIAGNOSIS — F0391 Unspecified dementia with behavioral disturbance: Secondary | ICD-10-CM

## 2020-01-11 DIAGNOSIS — Z5181 Encounter for therapeutic drug level monitoring: Secondary | ICD-10-CM | POA: Diagnosis not present

## 2020-01-11 NOTE — Progress Notes (Addendum)
PATIENT: Larry Cordova DOB: [birth year]  REASON FOR VISIT: follow up HISTORY FROM: patient  Chief Complaint  Patient presents with  . Follow-up    Rm2. With wife. No changes. [employer] care worker was has visited.     HISTORY OF PRESENT ILLNESS: Today 01/11/20 Larry Cordova is a 86 y.o. male here today for follow up for dementia. He continues divalproex 500mg  at bedtime for behavioral management. This helps with agitation. He lives at home with his wife and their son, who helps with his care. He does not drive. His wife helps with ADL's. He does fuss some when she tries to wash his bottom, so he is able to wash himself for now. He is able to use restroom independently. Wife and son help with medications. No falls. Walks with a cane. He is seen yearly by PCP. He had a visit with [facility] NP.    HISTORY: (copied from my note on 05/18/2019)  Larry Cordova is a 86 y.o. male here today for follow up of dementia.  Mr. Larry Cordova states that he is feeling well.  Mrs. Larry Cordova assist in obtaining history as Mr. Larry Cordova is hard of hearing.  She reports that he is doing very well on Depakote 500 mg at night.  He is tolerating this medication well.  She states that behavioral component is well controlled.  He is not nearly as agitated as he was.  Memory is stable.  He does not drive.  He is able to dress and bathe himself.  She denies any falls.  He has regular follow-up with primary care provider.  History (copied from [place] note on 09/01/2018)  Mr. Larry Cordova is an 86 year old male with a history of memory disturbance.  He returns today for follow-up.  He is here today with his wife.  He is able to complete all ADLs independently.  He does not operate a motor vehicle.  Denies any trouble sleeping.  Wife reports he has a good appetite.  She reports that the Depakote is working well.  Denies hallucinations.  She reports that he continues to talk all day long typically not related to the  conversation at [place].  He returns today for evaluation.  HISTORY 12/12/17 Mr. Larry Cordova is an 87 year old male with a history of memory disturbance. He returns today for follow-up. He is currently taking Depakote for agitation. His wife reports that this is been working well. She feels that his memory has remained the same. He is able to complete all ADLs independently. He no longer operates a Teacher, music. His wife handles all the finances and does all the cooking. She reports that he sleeps okay but feels that this could be better. His balance is slightly off. He has not had any falls. I did put a referral in for home health for physical therapy. Denies any new neurological symptoms. Returns today for an evaluation.    REVIEW OF SYSTEMS: Out of a complete 14 system review of symptoms, the patient complains only of the following symptoms, none and all other reviewed systems are negative.   ALLERGIES: No Known Allergies  HOME MEDICATIONS: Outpatient Medications Prior to Visit  Medication Sig Dispense Refill  . acetaminophen (TYLENOL) 500 MG tablet Take 1,000 mg by mouth every 6 (six) hours as needed for mild pain or headache.    Marland Kitchen aspirin EC 81 MG tablet Take 81 mg by mouth daily.    . divalproex (DEPAKOTE ER) 250 MG 24 hr  tablet TAKE 2 TABLETS(500 MG) BY MOUTH AT BEDTIME 180 tablet 0  . levothyroxine (SYNTHROID) 112 MCG tablet TAKE 1 TABLET(112 MCG) BY MOUTH DAILY 30 tablet 0  . lisinopril (ZESTRIL) 5 MG tablet Take 0.5 tablets (2.5 mg total) by mouth daily. 90 tablet 0   No facility-administered medications prior to visit.    PAST MEDICAL HISTORY: Past Medical History:  Diagnosis Date  . Allergy   . Arthritis   . Depression   . Hypertension   . Kyphosis   . Spondylolisthesis 03/18/2017  . Thyroid disease   . Tinnitus     PAST SURGICAL HISTORY: Past Surgical History:  Procedure Laterality Date  . HERNIA REPAIR      FAMILY HISTORY: Family History  Problem  Relation Age of Onset  . Kidney failure Mother   . Dementia Neg Hx     SOCIAL HISTORY: Social History   Socioeconomic History  . Marital status: Single    Spouse name: Not on file  . Number of children: Not on file  . Years of education: 10th grade  . Highest education level: Not on file  Occupational History  . Occupation: retired  Tobacco Use  . Smoking status: Former Smoker    Years: 30.00    Types: Cigarettes, Pipe, Cigars  . Smokeless tobacco: Never Used  Substance and Sexual Activity  . Alcohol use: No  . Drug use: No  . Sexual activity: Yes  Other Topics Concern  . Not on file  Social History Narrative   Lives with wife   Caffeine use: none   Social Determinants of Health   Financial Resource Strain:   . Difficulty of Paying Living Expenses: Not on file  Food Insecurity:   . Worried About Charity fundraiser in the Last Year: Not on file  . Ran Out of Food in the Last Year: Not on file  Transportation Needs:   . Lack of Transportation (Medical): Not on file  . Lack of Transportation (Non-Medical): Not on file  Physical Activity:   . Days of Exercise per Week: Not on file  . Minutes of Exercise per Session: Not on file  Stress:   . Feeling of Stress : Not on file  Social Connections:   . Frequency of Communication with Friends and Family: Not on file  . Frequency of Social Gatherings with Friends and Family: Not on file  . Attends Religious Services: Not on file  . Active Member of Clubs or Organizations: Not on file  . Attends Archivist Meetings: Not on file  . Marital Status: Not on file  Intimate Partner Violence:   . Fear of Current or Ex-Partner: Not on file  . Emotionally Abused: Not on file  . Physically Abused: Not on file  . Sexually Abused: Not on file      PHYSICAL EXAM  Vitals:   01/11/20 1309  BP: 130/73  Pulse: 69  Temp: 97.9 F (36.6 C)  Weight: 175 lb 3.2 oz (79.5 kg)  Height: 6\' 3"  (1.905 m)   Body mass index  is 21.9 kg/m.  Generalized: Well developed, in no acute distress  Cardiology: normal rate and rhythm, no murmur noted Neurological examination  Mentation: Alert, talkative in room, not oriented to time, is oriented to place, not oriented to history taking. Follows intermittent commands speech garbled at times Cranial nerve II-XII: Pupils were equal round reactive to light. Extraocular movements were full, unable to complete as patient following intermittent commands Motor: The  motor testing reveals 5 over 5 strength of all 4 extremities. Good symmetric motor tone is noted throughout.  Sensory: Sensory testing is intact to soft touch on all 4 extremities. No evidence of extinction is noted.  Coordination: patient unable to complete Gait and station: Gait is short but stable, stooped posture  Reflexes: Deep tendon reflexes are symmetric and normal bilaterally.   DIAGNOSTIC DATA (LABS, IMAGING, TESTING) - I reviewed patient records, labs, notes, testing and imaging myself where available.  MMSE - Mini Mental State Exam 01/11/2020 09/01/2018 12/12/2017  Orientation to time 0 2 0  Orientation to Place 4 5 4   Registration 2 3 3   Attention/ Calculation 0 2 1  Recall 0 0 0  Language- name 2 objects 2 2 2   Language- repeat 0 0 0  Language- follow 3 step command 3 2 3   Language- read & follow direction 1 1 1   Write a sentence 0 1 1  Copy design 0 0 0  Total score 12 18 15      Lab Results  Component Value Date   WBC 8.3 09/01/2018   HGB 14.5 09/01/2018   HCT 43.2 09/01/2018   MCV 91 09/01/2018   PLT 178 09/01/2018      Component Value Date/Time   NA 139 06/25/2019 1235   K 4.5 06/25/2019 1235   CL 101 06/25/2019 1235   CO2 25 06/25/2019 1235   GLUCOSE 84 06/25/2019 1235   GLUCOSE 96 07/29/2014 0906   BUN 9 06/25/2019 1235   CREATININE 1.08 06/25/2019 1235   CREATININE 0.94 07/29/2014 0906   CALCIUM 8.9 06/25/2019 1235   PROT 6.6 09/01/2018 1337   ALBUMIN 3.8 09/01/2018 1337    AST 27 09/01/2018 1337   ALT 21 09/01/2018 1337   ALKPHOS 74 09/01/2018 1337   BILITOT 0.5 09/01/2018 1337   GFRNONAA 62 06/25/2019 1235   GFRNONAA 82 05/11/2014 1231   GFRAA 71 06/25/2019 1235   GFRAA >89 05/11/2014 1231   Lab Results  Component Value Date   CHOL 179 07/29/2014   HDL 50 07/29/2014   LDLCALC 111 (H) 07/29/2014   TRIG 90 07/29/2014   CHOLHDL 3.6 07/29/2014   Lab Results  Component Value Date   HGBA1C 5.5 08/07/2017   Lab Results  Component Value Date   [id number] 01/01/2017   Lab Results  Component Value Date   TSH 4.040 06/25/2019     ASSESSMENT AND PLAN 86 y.o. year old male  has a past medical history of Allergy, Arthritis, Depression, Hypertension, Kyphosis, Spondylolisthesis (03/18/2017), Thyroid disease, and Tinnitus. here with     ICD-10-CM   1. Dementia with behavioral disturbance, unspecified dementia type (HCC)  F03.91 CBC with Differential/Platelets    CMP    Valproic Acid Level  2. Medication monitoring encounter  Z51.81 CBC with Differential/Platelets    CMP    Valproic Acid Level     Mr Cade appears to be doing well. He is tolerating medication well and Mrs Penick feels that it is very beneficial to help manage behavior. He continues to be very talkative and most conversation is not pertinent to situation, however, he seems happy. He continues to need assistance with all ADL's. Mrs Netto feels they have a great support system and are doing well. We will update labs today. He will continue close follow up with PCP as advised. He will return to see me in 1 year, sooner if needed. Mrs Seat verbalizes understanding and agreement with this plan.  Orders Placed This Encounter  Procedures  . CBC with Differential/Platelets  . CMP  . Valproic Acid Level     No orders of the defined types were placed in this encounter.     I spent 15 minutes with the patient. 50% of this time was spent counseling and educating patient on plan of care and  medications.    Debbora Presto, FNP-C 01/11/2020, 1:45 PM [facility] [address] [place], [place] [address] [phone]  Made any corrections needed, and agree with history, physical, neuro exam,assessment and plan as stated.     Sarina Ill, MD [facility]

## 2020-01-11 NOTE — Patient Instructions (Addendum)
We will continue divalproex 500mg  at bedtime.    We will follow up in 1 year, sooner if needed    Dementia Caregiver Guide Dementia is a term used to describe a number of symptoms that affect memory and thinking. The most common symptoms include:  Memory loss.  Trouble with language and communication.  Trouble concentrating.  Poor judgment.  Problems with reasoning.  Child-like behavior and language.  Extreme anxiety.  Angry outbursts.  Wandering from home or public places. Dementia usually gets worse slowly over time. In the early stages, people with dementia can stay independent and safe with some help. In later stages, they need help with daily tasks such as dressing, grooming, and using the bathroom. How to help the person with dementia cope Dementia can be frightening and confusing. Here are some tips to help the person with dementia cope with changes caused by the disease. General tips  Keep the person on track with his or her routine.  Try to identify areas where the person may need help.  Be supportive, patient, calm, and encouraging.  Gently remind the person that adjusting to changes takes time.  Help with the tasks that the person has asked for help with.  Keep the person involved in daily tasks and decisions as much as possible.  Encourage conversation, but try not to get frustrated or harried if the person struggles to find words or does not seem to appreciate your help. Communication tips  When the person is talking or seems frustrated, make eye contact and hold the person's [place].  Ask specific questions that need yes or no answers.  Use simple words, short sentences, and a calm voice. Only give one direction at a time.  When offering choices, limit them to just 1 or 2.  Avoid correcting the person in a negative way.  If the person is struggling to find the right words, gently try to help him or her. How to recognize symptoms of  stress Symptoms of stress in caregivers include:  Feeling frustrated or angry with the person with dementia.  Denying that the person has dementia or that his or her symptoms will not improve.  Feeling hopeless and unappreciated.  Difficulty sleeping.  Difficulty concentrating.  Feeling anxious, irritable, or depressed.  Developing stress-related health problems.  Feeling like you have too little time for your own life. Follow these instructions at home:   Make sure that you and the person you are caring for: ? Get regular sleep. ? Exercise regularly. ? Eat regular, nutritious meals. ? Drink enough fluid to keep your urine clear or pale yellow. ? Take over-the-counter and prescription medicines only as told by your health care providers. ? Attend all scheduled health care appointments.  Join a support group with others who are caregivers.  Ask about respite care resources so that you can have a regular break from the stress of caregiving.  Look for signs of stress in yourself and in the person you are caring for. If you notice signs of stress, take steps to manage it.  Consider any safety risks and take steps to avoid them.  Organize medications in a pill box for each day of the week.  Create a plan to handle any legal or financial matters. Get legal or financial advice if needed.  Keep a calendar in a central location to remind the person of appointments or other activities. Tips for reducing the risk of injury  Keep floors clear of clutter. Remove rugs, magazine  racks, and floor lamps.  Keep hallways well lit, especially at night.  Put a handrail and nonslip mat in the bathtub or shower.  Put childproof locks on cabinets that contain dangerous items, such as medicines, alcohol, guns, toxic cleaning items, sharp tools or utensils, matches, and lighters.  Put the locks in places where the person cannot see or reach them easily. This will help ensure that the person  does not wander out of the house and get lost.  Be prepared for emergencies. Keep a list of emergency phone numbers and addresses in a convenient area.  Remove car keys and lock garage doors so that the person does not try to get in the car and drive.  Have the person wear a bracelet that tracks locations and identifies the person as having memory problems. This should be worn at all times for safety. Where to find support: Many individuals and organizations offer support. These include:  Support groups for people with dementia and for caregivers.  Counselors or therapists.  Home health care services.  Adult day care centers. Where to find more information Alzheimer's Association: [employer] Contact a health care provider if:  The person's health is rapidly getting worse.  You are no longer able to care for the person.  Caring for the person is affecting your physical and emotional health.  The person threatens himself or herself, you, or anyone else. Summary  Dementia is a term used to describe a number of symptoms that affect memory and thinking.  Dementia usually gets worse slowly over time.  Take steps to reduce the person's risk of injury, and to plan for future care.  Caregivers need support, relief from caregiving, and time for their own lives. This information is not intended to replace advice given to you by your health care provider. Make sure you discuss any questions you have with your health care provider. Document Revised: 11/08/2017 Document Reviewed: 10/30/2016 Elsevier Patient Education  2020 [employer].

## 2020-01-12 LAB — CBC WITH DIFFERENTIAL/PLATELET
Basophils Absolute: 0.1 10*3/uL (ref 0.0–0.2)
Basos: 1 %
EOS (ABSOLUTE): 0.5 10*3/uL — ABNORMAL HIGH (ref 0.0–0.4)
Eos: 6 %
Hematocrit: 43.8 % (ref 37.5–51.0)
Hemoglobin: 14.7 g/dL (ref 13.0–17.7)
Immature Grans (Abs): 0 10*3/uL (ref 0.0–0.1)
Immature Granulocytes: 0 %
Lymphocytes Absolute: 2.6 10*3/uL (ref 0.7–3.1)
Lymphs: 31 %
MCH: 31 pg (ref 26.6–33.0)
MCHC: 33.6 g/dL (ref 31.5–35.7)
MCV: 92 fL (ref 79–97)
Monocytes Absolute: 0.8 10*3/uL (ref 0.1–0.9)
Monocytes: 9 %
Neutrophils Absolute: 4.5 10*3/uL (ref 1.4–7.0)
Neutrophils: 53 %
Platelets: 175 10*3/uL (ref 150–450)
RBC: 4.74 x10E6/uL (ref 4.14–5.80)
RDW: 13.2 % (ref 11.6–15.4)
WBC: 8.4 10*3/uL (ref 3.4–10.8)

## 2020-01-12 LAB — COMPREHENSIVE METABOLIC PANEL
ALT: 11 IU/L (ref 0–44)
AST: 23 IU/L (ref 0–40)
Albumin/Globulin Ratio: 1.3 (ref 1.2–2.2)
Albumin: 3.7 g/dL (ref 3.6–4.6)
Alkaline Phosphatase: 70 IU/L (ref 39–117)
BUN/Creatinine Ratio: 6 — ABNORMAL LOW (ref 10–24)
BUN: 6 mg/dL — ABNORMAL LOW (ref 8–27)
Bilirubin Total: 0.6 mg/dL (ref 0.0–1.2)
CO2: 24 mmol/L (ref 20–29)
Calcium: 9 mg/dL (ref 8.6–10.2)
Chloride: 100 mmol/L (ref 96–106)
Creatinine, Ser: 1.02 mg/dL (ref 0.76–1.27)
GFR calc Af Amer: 76 mL/min/{1.73_m2} (ref >59)
GFR calc non Af Amer: 66 mL/min/{1.73_m2} (ref >59)
Globulin, Total: 2.8 g/dL (ref 1.5–4.5)
Glucose: 83 mg/dL (ref 65–99)
Potassium: 4.3 mmol/L (ref 3.5–5.2)
Sodium: 139 mmol/L (ref 134–144)
Total Protein: 6.5 g/dL (ref 6.0–8.5)

## 2020-01-12 LAB — VALPROIC ACID LEVEL: Valproic Acid Lvl: 50 ug/mL (ref 50–100)

## 2020-01-13 ENCOUNTER — Telehealth: Payer: Self-pay | Admitting: *Deleted

## 2020-01-13 NOTE — Telephone Encounter (Signed)
LVM informing patient his labs look good. Left # for questions.

## 2020-03-04 ENCOUNTER — Other Ambulatory Visit: Payer: Self-pay

## 2020-03-04 ENCOUNTER — Encounter: Payer: Self-pay | Admitting: Podiatry

## 2020-03-04 ENCOUNTER — Ambulatory Visit: Payer: Medicare Other | Admitting: Podiatry

## 2020-03-04 VITALS — Temp 96.5°F

## 2020-03-04 DIAGNOSIS — M2042 Other hammer toe(s) (acquired), left foot: Secondary | ICD-10-CM

## 2020-03-04 DIAGNOSIS — M2011 Hallux valgus (acquired), right foot: Secondary | ICD-10-CM

## 2020-03-04 DIAGNOSIS — M2041 Other hammer toe(s) (acquired), right foot: Secondary | ICD-10-CM

## 2020-03-04 DIAGNOSIS — B351 Tinea unguium: Secondary | ICD-10-CM | POA: Diagnosis not present

## 2020-03-04 DIAGNOSIS — M79675 Pain in left toe(s): Secondary | ICD-10-CM

## 2020-03-04 DIAGNOSIS — M79674 Pain in right toe(s): Secondary | ICD-10-CM

## 2020-03-04 DIAGNOSIS — M2012 Hallux valgus (acquired), left foot: Secondary | ICD-10-CM

## 2020-03-04 NOTE — Patient Instructions (Signed)

## 2020-03-07 NOTE — Progress Notes (Signed)
Subjective: Larry Cordova presents today referred by [name], MD for complaint of painful mycotic nails b/l that are difficult to trim. Pain interferes with ambulation. Aggravating factors include wearing enclosed shoe gear.  He states he cannot cut them any more.   Wife is present during the visit.  Past Medical History:  Diagnosis Date  . Allergy   . Arthritis   . Depression   . Hypertension   . Kyphosis   . Spondylolisthesis 03/18/2017  . Thyroid disease   . Tinnitus      Patient Active Problem List   Diagnosis Date Noted  . PAD (peripheral artery disease) ([place]) 08/18/2019  . Dementia with behavioral disturbance ([place]) 05/18/2019  . Spondylolisthesis 03/18/2017  . Frontotemporal dementia ([place]) 01/03/2017  . Skin cancer 07/29/2014  . Other and unspecified hyperlipidemia 07/29/2014  . Arthritis 07/13/2013  . Hypothyroid 06/16/2012  . HTN (hypertension) 06/16/2012     Past Surgical History:  Procedure Laterality Date  . HERNIA REPAIR       Current Outpatient Medications on File Prior to Visit  Medication Sig Dispense Refill  . acetaminophen (TYLENOL) 500 MG tablet Take 1,000 mg by mouth every 6 (six) hours as needed for mild pain or headache.    Marland Kitchen aspirin EC 81 MG tablet Take 81 mg by mouth daily.    . divalproex (DEPAKOTE ER) 250 MG 24 hr tablet TAKE 2 TABLETS(500 MG) BY MOUTH AT BEDTIME 180 tablet 0  . levothyroxine (SYNTHROID) 112 MCG tablet TAKE 1 TABLET(112 MCG) BY MOUTH DAILY 30 tablet 0   No current facility-administered medications on file prior to visit.     No Known Allergies   Social History   Occupational History  . Occupation: retired  Tobacco Use  . Smoking status: Former Smoker    Years: 30.00    Types: Cigarettes, Pipe, Cigars  . Smokeless tobacco: Never Used  Substance and Sexual Activity  . Alcohol use: No  . Drug use: No  . Sexual activity: Yes     Family History  Problem Relation Age of Onset  . Kidney failure Mother   .  Dementia Neg Hx      Immunization History  Administered Date(s) Administered  . Influenza,inj,Quad PF,6+ Mos 07/29/2014, 11/22/2015, 01/14/2017, 08/07/2017  . Pneumococcal Conjugate-13 07/29/2014  . Pneumococcal Polysaccharide-23 03/10/2010  . Tdap 03/10/2010  . Zoster 05/10/2010     Objective: Vitals:   03/04/20 1106  Temp: (!) 96.5 F (35.8 C)    Pt is an 86 y.o. Caucasian male in NAD.  AAO x 3.   Vascular Examination:  Capillary refill time to digits immediate b/l. Palpable DP pulses b/l. Palpable PT pulses b/l. Pedal hair sparse b/l. Skin temperature gradient within normal limits b/l.  Dermatological Examination: Pedal skin with normal turgor, texture and tone bilaterally. No open wounds bilaterally. No interdigital macerations bilaterally. Toenails L hallux, L 3rd toe, L 4th toe, L 5th toe, R hallux, R 2nd toe, R 4th toe and R 5th toe elongated, dystrophic, thickened, and crumbly with subungual debris and tenderness to dorsal palpation. Anonychia noted L 2nd toe and R 3rd toe. Nailbed(s) epithelialized.   Musculoskeletal: Normal muscle strength 5/5 to all lower extremity muscle groups bilaterally, no pain crepitus or joint limitation noted with ROM b/l, bunion deformity noted b/l and hammertoes noted to the  2-5 bilaterally  Neurological: Protective sensation intact 5/5 intact bilaterally with 10g monofilament b/l Vibratory sensation intact b/l Proprioception intact bilaterally  Assessment: 1. Pain due to  onychomycosis of toenails of both feet   2. Hallux valgus, acquired, bilateral   3. Acquired hammertoes of both feet     Plan: -Toenails L hallux, L 3rd toe, L 4th toe, L 5th toe, R hallux, R 2nd toe, R 4th toe and R 5th toe debrided in length and girth without iatrogenic bleeding with sterile nail nipper and dremel.  -Patient to continue soft, supportive shoe gear daily. -Patient to report any pedal injuries to medical professional immediately. -Patient/POA to call  should there be question/concern in the interim.  Return in about 3 months (around 06/04/2020).

## 2020-04-01 ENCOUNTER — Telehealth: Payer: Self-pay | Admitting: Family Medicine

## 2020-04-01 ENCOUNTER — Telehealth (INDEPENDENT_AMBULATORY_CARE_PROVIDER_SITE_OTHER): Payer: Medicare Other | Admitting: Family Medicine

## 2020-04-01 ENCOUNTER — Encounter: Payer: Self-pay | Admitting: Family Medicine

## 2020-04-01 ENCOUNTER — Other Ambulatory Visit: Payer: Self-pay

## 2020-04-01 DIAGNOSIS — E039 Hypothyroidism, unspecified: Secondary | ICD-10-CM

## 2020-04-01 MED ORDER — LEVOTHYROXINE SODIUM 112 MCG PO TABS: ORAL_TABLET | ORAL | 3 refills | Status: AC

## 2020-04-01 NOTE — Progress Notes (Signed)
Virtual Visit via phone Note Call at 1232 - no answer, left VM Call at 1250 - no answer, left VM Call at 112pm - connected.    I connected with Larry Cordova on 04/01/20 at 1:12 PM by audio no video capability. Verified that I am speaking with the correct person using two identifiers. Wife providing history as patient not wanting to talk on phone.    I discussed the limitations, risks, security and privacy concerns of performing an evaluation and management service by telephone and the availability of in person appointments. I also discussed with the patient that there may be a patient responsible charge related to this service. The patient expressed understanding and agreed to proceed, consent obtained  Chief complaint:  Chief Complaint  Patient presents with  . Medication Refill    on his syntroid. pt hasn't had any unexplained weightloss or weight gain. pt is feeling well as far as his general health.      History of Present Illness: Larry Cordova is a 86 y.o. male  Hypothyroidism: Lab Results  Component Value Date   TSH 4.040 06/25/2019  Followed by neurology for dementia, vascular for peripheral arterial disease. Taking medication daily. Synthroid 140mcg qd.  No new hot or cold intolerance. No new hair or skin changes, heart palpitations or new fatigue. No new weight changes.   Has not yet covid vaccine. Wife plans to look into this.   Patient Active Problem List   Diagnosis Date Noted  . PAD (peripheral artery disease) ([place]) 08/18/2019  . Dementia with behavioral disturbance ([place]) 05/18/2019  . Spondylolisthesis 03/18/2017  . Frontotemporal dementia ([place]) 01/03/2017  . Skin cancer 07/29/2014  . Other and unspecified hyperlipidemia 07/29/2014  . Arthritis 07/13/2013  . Hypothyroid 06/16/2012  . HTN (hypertension) 06/16/2012   Past Medical History:  Diagnosis Date  . Allergy   . Arthritis   . Depression   . Hypertension   . Kyphosis   . Spondylolisthesis  03/18/2017  . Thyroid disease   . Tinnitus    Past Surgical History:  Procedure Laterality Date  . HERNIA REPAIR     No Known Allergies Prior to Admission medications   Medication Sig Start Date End Date Taking? Authorizing Provider  acetaminophen (TYLENOL) 500 MG tablet Take 1,000 mg by mouth every 6 (six) hours as needed for mild pain or headache.   Yes [provider]  divalproex (DEPAKOTE ER) 250 MG 24 hr tablet TAKE 2 TABLETS(500 MG) BY MOUTH AT BEDTIME 12/29/19  Yes Lomax, Amy, NP  levothyroxine (SYNTHROID) 112 MCG tablet TAKE 1 TABLET(112 MCG) BY MOUTH DAILY 09/09/19  Yes [name], MD  aspirin EC 81 MG tablet Take 81 mg by mouth daily.    [provider]   Social History   Socioeconomic History  . Marital status: Single    Spouse name: Not on file  . Number of children: Not on file  . Years of education: 10th grade  . Highest education level: Not on file  Occupational History  . Occupation: retired  Tobacco Use  . Smoking status: Former Smoker    Years: 30.00    Types: Cigarettes, Pipe, Cigars  . Smokeless tobacco: Never Used  Substance and Sexual Activity  . Alcohol use: No  . Drug use: No  . Sexual activity: Yes  Other Topics Concern  . Not on file  Social History Narrative   Lives with wife   Caffeine use: none   Social Determinants of Health  Financial Resource Strain:   . Difficulty of Paying Living Expenses:   Food Insecurity:   . Worried About Charity fundraiser in the Last Year:   . Arboriculturist in the Last Year:   Transportation Needs:   . Film/video editor (Medical):   Marland Kitchen Lack of Transportation (Non-Medical):   Physical Activity:   . Days of Exercise per Week:   . Minutes of Exercise per Session:   Stress:   . Feeling of Stress :   Social Connections:   . Frequency of Communication with Friends and Family:   . Frequency of Social Gatherings with Friends and Family:   . Attends Religious Services:   . Active  Member of Clubs or Organizations:   . Attends Archivist Meetings:   Marland Kitchen Marital Status:   Intimate Partner Violence:   . Fear of Current or Ex-Partner:   . Emotionally Abused:   Marland Kitchen Physically Abused:   . Sexually Abused:     Observations/Objective:  Vitals:   04/01/20 1036  BP: 96/65  Pulse: 69  Weight: 180 lb (81.6 kg)  Height: 6\' 2"  (1.88 m)  History provided by wife, states that he is feeling well, no new concerns.  None weight loss or gain, negative symptoms as above regarding thyroid.  All questions were answered with understanding of plan expressed.    Assessment and Plan: Hypothyroidism, unspecified type - Plan: levothyroxine (SYNTHROID) 112 MCG tablet Lab only visit planned.  Check TSH.  Recommended COVID-19 vaccine.   Follow Up Instructions: 6 months in office.   I discussed the assessment and treatment plan with the patient. The patient was provided an opportunity to ask questions and all were answered. The patient agreed with the plan and demonstrated an understanding of the instructions.   The patient was advised to call back or seek an in-person evaluation if the symptoms worsen or if the condition fails to improve as anticipated.  I provided 11 total minutes of non-face-to-face time during this encounter with chart review.   [name], MD

## 2020-04-01 NOTE — Telephone Encounter (Signed)
04/01/2020 - PATIENT HAD A TELEMED VISIT WITH [name] ON Friday (04/01/2020). [name] HAS REQUESTED PATIENT RETURN IN 6 MONTHS (IN OFFICE) FOR A THYROID FOLLOW-UP. I TRIED TO CALL AND SCHEDULE BUT HAD TO LEAVE HIM A VOICE MAIL TO RETURN MY CALL. HE IS ALREADY SCHEDULED FOR A LAB VISIT PER [name] ON TUES. (04/05/2020). [place]

## 2020-04-05 ENCOUNTER — Ambulatory Visit: Payer: Medicare Other

## 2020-05-02 ENCOUNTER — Other Ambulatory Visit: Payer: Self-pay | Admitting: *Deleted

## 2020-05-02 DIAGNOSIS — F0391 Unspecified dementia with behavioral disturbance: Secondary | ICD-10-CM

## 2020-05-02 MED ORDER — DIVALPROEX SODIUM ER 250 MG PO TB24: ORAL_TABLET | ORAL | 0 refills | Status: DC

## 2020-06-06 ENCOUNTER — Ambulatory Visit: Payer: Medicare Other | Admitting: Podiatry

## 2020-07-22 ENCOUNTER — Ambulatory Visit: Payer: Medicare Other | Admitting: Podiatry

## 2020-08-17 ENCOUNTER — Other Ambulatory Visit: Payer: Self-pay | Admitting: *Deleted

## 2020-08-17 DIAGNOSIS — F0391 Unspecified dementia with behavioral disturbance: Secondary | ICD-10-CM

## 2020-08-17 MED ORDER — DIVALPROEX SODIUM ER 250 MG PO TB24: ORAL_TABLET | ORAL | 1 refills | Status: AC

## 2020-10-28 ENCOUNTER — Ambulatory Visit: Payer: Medicare Other | Admitting: Podiatry

## 2021-01-10 ENCOUNTER — Ambulatory Visit: Payer: Medicare Other | Admitting: Family Medicine

## 2021-01-16 ENCOUNTER — Telehealth: Payer: Self-pay | Admitting: Family Medicine

## 2021-01-16 NOTE — Telephone Encounter (Signed)
Pt needs an an appt. In the next 2 weeks to discuss pt's falls and can order pt a hospital bed at that time.

## 2021-01-16 NOTE — Telephone Encounter (Signed)
Pt is requesting [place] to ordePt is requesting [place] to order a hospital bed due to physical limitations. Please adviser a hospital bed due to physical limitations. Please advise

## 2021-01-16 NOTE — Telephone Encounter (Signed)
2/7/2022N - PATIENT'S WIFE WANTS [name] TO KNOW THAT SHE NEEDS TO HAVE A HOSPITAL BED FOR HER HUSBAND DUE TO HIM NOT BEING ABLE TO WALK ANYMORE. HE HAS FALLEN 3 TIMES TRYING TO GO TO THE BATHROOM. SHE COULD USE SOME HELP DURING THE DAY AND ALSO WHEN HE NEEDS TO BE TAKEN SOMEWHERE. SHE DOES NOT WANT TO PUT HIM IN A NURSING HOME. HE HAS DEMENTIA BUT HE IS ALERT. BEST PHONE [phone] (MARTHA Pleitez - WIFE) PLEASE CALL AFTER 12:00 [place]

## 2021-01-16 NOTE — Telephone Encounter (Signed)
Pt is requesting [place] to order a hospital bed due to physical limitations. Please advise

## 2021-01-16 NOTE — Telephone Encounter (Signed)
I think that is reasonable, but I think we have to do an in person evaluation first.  I have not seen him since April last year, plan 2-month appointment at that time.  Is there any way that he can be seen in office in the next week or 2?  I would like to discuss his falls further.  Can also order hospital bed likely at that time.

## 2021-01-18 ENCOUNTER — Inpatient Hospital Stay (HOSPITAL_COMMUNITY): Payer: Medicare Other

## 2021-01-18 ENCOUNTER — Emergency Department (HOSPITAL_COMMUNITY): Payer: Medicare Other

## 2021-01-18 ENCOUNTER — Encounter (HOSPITAL_COMMUNITY): Payer: Self-pay

## 2021-01-18 ENCOUNTER — Inpatient Hospital Stay (HOSPITAL_COMMUNITY)
Admission: EM | Admit: 2021-01-18 | Discharge: 2021-01-23 | DRG: 682 | Disposition: A | Discharge status: Home Health | Payer: Medicare Other | Attending: Internal Medicine | Admitting: Internal Medicine

## 2021-01-18 DIAGNOSIS — B957 Other staphylococcus as the cause of diseases classified elsewhere: Secondary | ICD-10-CM | POA: Diagnosis present

## 2021-01-18 DIAGNOSIS — M1990 Unspecified osteoarthritis, unspecified site: Secondary | ICD-10-CM | POA: Diagnosis present

## 2021-01-18 DIAGNOSIS — N138 Other obstructive and reflux uropathy: Secondary | ICD-10-CM | POA: Diagnosis not present

## 2021-01-18 DIAGNOSIS — E860 Dehydration: Secondary | ICD-10-CM | POA: Diagnosis not present

## 2021-01-18 DIAGNOSIS — R451 Restlessness and agitation: Secondary | ICD-10-CM | POA: Diagnosis not present

## 2021-01-18 DIAGNOSIS — N19 Unspecified kidney failure: Secondary | ICD-10-CM | POA: Diagnosis not present

## 2021-01-18 DIAGNOSIS — R338 Other retention of urine: Secondary | ICD-10-CM | POA: Diagnosis not present

## 2021-01-18 DIAGNOSIS — Z79899 Other long term (current) drug therapy: Secondary | ICD-10-CM

## 2021-01-18 DIAGNOSIS — R5381 Other malaise: Secondary | ICD-10-CM | POA: Diagnosis not present

## 2021-01-18 DIAGNOSIS — Z87891 Personal history of nicotine dependence: Secondary | ICD-10-CM

## 2021-01-18 DIAGNOSIS — E871 Hypo-osmolality and hyponatremia: Secondary | ICD-10-CM

## 2021-01-18 DIAGNOSIS — S199XXA Unspecified injury of neck, initial encounter: Secondary | ICD-10-CM | POA: Diagnosis not present

## 2021-01-18 DIAGNOSIS — N281 Cyst of kidney, acquired: Secondary | ICD-10-CM | POA: Diagnosis not present

## 2021-01-18 DIAGNOSIS — I739 Peripheral vascular disease, unspecified: Secondary | ICD-10-CM | POA: Diagnosis present

## 2021-01-18 DIAGNOSIS — R339 Retention of urine, unspecified: Secondary | ICD-10-CM | POA: Diagnosis not present

## 2021-01-18 DIAGNOSIS — S0990XA Unspecified injury of head, initial encounter: Secondary | ICD-10-CM | POA: Diagnosis not present

## 2021-01-18 DIAGNOSIS — D72829 Elevated white blood cell count, unspecified: Secondary | ICD-10-CM | POA: Diagnosis not present

## 2021-01-18 DIAGNOSIS — Z79890 Hormone replacement therapy (postmenopausal): Secondary | ICD-10-CM

## 2021-01-18 DIAGNOSIS — M40209 Unspecified kyphosis, site unspecified: Secondary | ICD-10-CM | POA: Diagnosis not present

## 2021-01-18 DIAGNOSIS — F32A Depression, unspecified: Secondary | ICD-10-CM | POA: Diagnosis present

## 2021-01-18 DIAGNOSIS — Z841 Family history of disorders of kidney and ureter: Secondary | ICD-10-CM

## 2021-01-18 DIAGNOSIS — F0391 Unspecified dementia with behavioral disturbance: Secondary | ICD-10-CM | POA: Diagnosis present

## 2021-01-18 DIAGNOSIS — R0902 Hypoxemia: Secondary | ICD-10-CM | POA: Diagnosis not present

## 2021-01-18 DIAGNOSIS — R7881 Bacteremia: Secondary | ICD-10-CM | POA: Diagnosis present

## 2021-01-18 DIAGNOSIS — I10 Essential (primary) hypertension: Secondary | ICD-10-CM | POA: Diagnosis present

## 2021-01-18 DIAGNOSIS — E079 Disorder of thyroid, unspecified: Secondary | ICD-10-CM | POA: Diagnosis not present

## 2021-01-18 DIAGNOSIS — F03918 Unspecified dementia, unspecified severity, with other behavioral disturbance: Secondary | ICD-10-CM | POA: Diagnosis present

## 2021-01-18 DIAGNOSIS — R531 Weakness: Secondary | ICD-10-CM | POA: Diagnosis not present

## 2021-01-18 DIAGNOSIS — E872 Acidosis, unspecified: Secondary | ICD-10-CM | POA: Diagnosis present

## 2021-01-18 DIAGNOSIS — I4510 Unspecified right bundle-branch block: Secondary | ICD-10-CM | POA: Diagnosis not present

## 2021-01-18 DIAGNOSIS — R296 Repeated falls: Secondary | ICD-10-CM | POA: Diagnosis present

## 2021-01-18 DIAGNOSIS — N401 Enlarged prostate with lower urinary tract symptoms: Secondary | ICD-10-CM | POA: Diagnosis present

## 2021-01-18 DIAGNOSIS — S0993XA Unspecified injury of face, initial encounter: Secondary | ICD-10-CM | POA: Diagnosis not present

## 2021-01-18 DIAGNOSIS — U071 COVID-19: Secondary | ICD-10-CM | POA: Diagnosis not present

## 2021-01-18 DIAGNOSIS — E039 Hypothyroidism, unspecified: Secondary | ICD-10-CM | POA: Diagnosis not present

## 2021-01-18 DIAGNOSIS — Z7982 Long term (current) use of aspirin: Secondary | ICD-10-CM

## 2021-01-18 DIAGNOSIS — R778 Other specified abnormalities of plasma proteins: Secondary | ICD-10-CM | POA: Diagnosis not present

## 2021-01-18 DIAGNOSIS — R000 Tachycardia, unspecified: Secondary | ICD-10-CM | POA: Diagnosis present

## 2021-01-18 DIAGNOSIS — N179 Acute kidney failure, unspecified: Principal | ICD-10-CM | POA: Diagnosis present

## 2021-01-18 DIAGNOSIS — R9431 Abnormal electrocardiogram [ECG] [EKG]: Secondary | ICD-10-CM | POA: Diagnosis not present

## 2021-01-18 LAB — CBC WITH DIFFERENTIAL/PLATELET
Abs Immature Granulocytes: 0.18 10*3/uL — ABNORMAL HIGH (ref 0.00–0.07)
Basophils Absolute: 0 10*3/uL (ref 0.0–0.1)
Basophils Relative: 0 %
Eosinophils Absolute: 0 10*3/uL (ref 0.0–0.5)
Eosinophils Relative: 0 %
HCT: 47.4 % (ref 39.0–52.0)
Hemoglobin: 15.2 g/dL (ref 13.0–17.0)
Immature Granulocytes: 1 %
Lymphocytes Relative: 7 %
Lymphs Abs: 0.9 10*3/uL (ref 0.7–4.0)
MCH: 30.5 pg (ref 26.0–34.0)
MCHC: 32.1 g/dL (ref 30.0–36.0)
MCV: 95.2 fL (ref 80.0–100.0)
Monocytes Absolute: 0.9 10*3/uL (ref 0.1–1.0)
Monocytes Relative: 7 %
Neutro Abs: 11.1 10*3/uL — ABNORMAL HIGH (ref 1.7–7.7)
Neutrophils Relative %: 85 %
Platelets: 115 10*3/uL — ABNORMAL LOW (ref 150–400)
RBC: 4.98 MIL/uL (ref 4.22–5.81)
RDW: 13.3 % (ref 11.5–15.5)
WBC: 13 10*3/uL — ABNORMAL HIGH (ref 4.0–10.5)
nRBC: 0 % (ref 0.0–0.2)

## 2021-01-18 LAB — COMPREHENSIVE METABOLIC PANEL
ALT: 30 U/L (ref 0–44)
AST: 56 U/L — ABNORMAL HIGH (ref 15–41)
Albumin: 3.1 g/dL — ABNORMAL LOW (ref 3.5–5.0)
Alkaline Phosphatase: 58 U/L (ref 38–126)
Anion gap: 19 — ABNORMAL HIGH (ref 5–15)
BUN: 59 mg/dL — ABNORMAL HIGH (ref 8–23)
CO2: 20 mmol/L — ABNORMAL LOW (ref 22–32)
Calcium: 8.5 mg/dL — ABNORMAL LOW (ref 8.9–10.3)
Chloride: 94 mmol/L — ABNORMAL LOW (ref 98–111)
Creatinine, Ser: 5.6 mg/dL — ABNORMAL HIGH (ref 0.61–1.24)
GFR, Estimated: 9 mL/min — ABNORMAL LOW (ref >60)
Glucose, Bld: 190 mg/dL — ABNORMAL HIGH (ref 70–99)
Potassium: 4.6 mmol/L (ref 3.5–5.1)
Sodium: 133 mmol/L — ABNORMAL LOW (ref 135–145)
Total Bilirubin: 1.3 mg/dL — ABNORMAL HIGH (ref 0.3–1.2)
Total Protein: 6.8 g/dL (ref 6.5–8.1)

## 2021-01-18 LAB — URINALYSIS, ROUTINE W REFLEX MICROSCOPIC
Bacteria, UA: NONE SEEN
Bilirubin Urine: NEGATIVE
Glucose, UA: NEGATIVE mg/dL
Ketones, ur: NEGATIVE mg/dL
Leukocytes,Ua: NEGATIVE
Nitrite: NEGATIVE
Protein, ur: NEGATIVE mg/dL
Specific Gravity, Urine: 1.015 (ref 1.005–1.030)
pH: 5 (ref 5.0–8.0)

## 2021-01-18 LAB — RESP PANEL BY RT-PCR (FLU A&B, COVID) ARPGX2
Influenza A by PCR: NEGATIVE
Influenza B by PCR: NEGATIVE
SARS Coronavirus 2 by RT PCR: POSITIVE — AB

## 2021-01-18 LAB — LACTIC ACID, PLASMA
Lactic Acid, Venous: 3.8 mmol/L (ref 0.5–1.9)
Lactic Acid, Venous: 2.7 mmol/L (ref 0.5–1.9)

## 2021-01-18 LAB — TROPONIN I (HIGH SENSITIVITY)
Troponin I (High Sensitivity): 31 ng/L — ABNORMAL HIGH (ref <18)
Troponin I (High Sensitivity): 33 ng/L — ABNORMAL HIGH (ref <18)

## 2021-01-18 LAB — CBG MONITORING, ED: Glucose-Capillary: 109 mg/dL — ABNORMAL HIGH (ref 70–99)

## 2021-01-18 LAB — PROCALCITONIN: Procalcitonin: 0.33 ng/mL

## 2021-01-18 LAB — CREATININE, URINE, RANDOM: Creatinine, Urine: 154.37 mg/dL

## 2021-01-18 LAB — SODIUM, URINE, RANDOM: Sodium, Ur: 69 mmol/L

## 2021-01-18 MED ORDER — SODIUM CHLORIDE 0.9 % IV SOLN: INTRAVENOUS | Status: DC

## 2021-01-18 MED ORDER — SODIUM CHLORIDE 0.9 % IV BOLUS
1000.0000 mL | Freq: Once | INTRAVENOUS | Status: AC
  Administered 2021-01-18: 1000 mL via INTRAVENOUS

## 2021-01-18 MED ORDER — GUAIFENESIN-DM 100-10 MG/5ML PO SYRP: 10.0000 mL | ORAL_SOLUTION | ORAL | Status: DC | PRN

## 2021-01-18 MED ORDER — ZINC SULFATE 220 (50 ZN) MG PO CAPS
220.0000 mg | ORAL_CAPSULE | Freq: Every day | ORAL | Status: DC
  Administered 2021-01-19 – 2021-01-23 (×5): 220 mg via ORAL
  Filled 2021-01-18 (×5): qty 1

## 2021-01-18 MED ORDER — POLYETHYLENE GLYCOL 3350 17 G PO PACK
17.0000 g | PACK | Freq: Every day | ORAL | Status: DC | PRN
Start: 2021-01-18 — End: 2021-01-23

## 2021-01-18 MED ORDER — ACETAMINOPHEN 325 MG PO TABS
650.0000 mg | ORAL_TABLET | Freq: Four times a day (QID) | ORAL | Status: DC | PRN
  Administered 2021-01-21: 650 mg via ORAL
  Filled 2021-01-18: qty 2

## 2021-01-18 MED ORDER — ASCORBIC ACID 500 MG PO TABS
500.0000 mg | ORAL_TABLET | Freq: Every day | ORAL | Status: DC
  Administered 2021-01-19 – 2021-01-23 (×5): 500 mg via ORAL
  Filled 2021-01-18 (×5): qty 1

## 2021-01-18 MED ORDER — ENOXAPARIN SODIUM 40 MG/0.4ML ~~LOC~~ SOLN: 40.0000 mg | SUBCUTANEOUS | Status: DC

## 2021-01-18 MED ORDER — SODIUM CHLORIDE 0.9 % IV SOLN: Freq: Once | INTRAVENOUS | Status: DC

## 2021-01-18 MED ORDER — SODIUM CHLORIDE 0.9 % IV BOLUS
500.0000 mL | Freq: Once | INTRAVENOUS | Status: AC
  Administered 2021-01-18: 500 mL via INTRAVENOUS

## 2021-01-18 MED ORDER — DIVALPROEX SODIUM ER 500 MG PO TB24
500.0000 mg | ORAL_TABLET | Freq: Every day | ORAL | Status: DC
  Administered 2021-01-18: 500 mg via ORAL
  Filled 2021-01-18 (×2): qty 1

## 2021-01-18 MED ORDER — ENOXAPARIN SODIUM 30 MG/0.3ML ~~LOC~~ SOLN
30.0000 mg | SUBCUTANEOUS | Status: DC
  Administered 2021-01-18 – 2021-01-19 (×2): 30 mg via SUBCUTANEOUS
  Filled 2021-01-18 (×2): qty 0.3

## 2021-01-18 MED ORDER — ASPIRIN EC 81 MG PO TBEC: 81.0000 mg | DELAYED_RELEASE_TABLET | Freq: Every day | ORAL | Status: DC

## 2021-01-18 MED ORDER — LEVOTHYROXINE SODIUM 112 MCG PO TABS
112.0000 ug | ORAL_TABLET | Freq: Every day | ORAL | Status: DC
  Administered 2021-01-19 – 2021-01-23 (×5): 112 ug via ORAL
  Filled 2021-01-18 (×5): qty 1

## 2021-01-18 MED ORDER — ONDANSETRON HCL 4 MG/2ML IJ SOLN: 4.0000 mg | Freq: Four times a day (QID) | INTRAMUSCULAR | Status: DC | PRN

## 2021-01-18 MED ORDER — ONDANSETRON HCL 4 MG PO TABS: 4.0000 mg | ORAL_TABLET | Freq: Four times a day (QID) | ORAL | Status: DC | PRN

## 2021-01-18 MED ORDER — ALBUTEROL SULFATE HFA 108 (90 BASE) MCG/ACT IN AERS: 2.0000 | INHALATION_SPRAY | RESPIRATORY_TRACT | Status: DC | PRN

## 2021-01-18 NOTE — ED Notes (Signed)
I called patient to recheck vitals and no one responded 

## 2021-01-18 NOTE — ED Notes (Signed)
Bladder scan noted 972mL of urine

## 2021-01-18 NOTE — ED Triage Notes (Signed)
Pt presents with c/o weakness/malaise for about one week. Pt comes with EMS from home. Pt has a hx of dementia with increased falls recently, three falls on Saturday. Per wife, pt is normally able to walk with assistance with a walker. NP saw pt today and wanted him to be seen. Wife also reported decreased urine output recently with dark urine.

## 2021-01-18 NOTE — Telephone Encounter (Signed)
Called to schedule appointment within 2 weeks to discuss fall and order hospital bed. Patient's housecall nurse Jimmey Ralph stated patient unable to come in for an office visit due to severe dehydration, low b/p, and other issues. Diane also stated patient would only be able to come in to the office if her were transported on a stretcher; she is considering having patient transported to hospital for care. No office visit scheduled at this time.

## 2021-01-18 NOTE — ED Provider Notes (Signed)
I provided a substantive portion of the care of this patient.  I personally performed the entirety of the medical decision making for this encounter.  EKG Interpretation  Date/Time:  Wednesday January 18 2021 13:01:14 EST Ventricular Rate:  100 PR Interval:    QRS Duration: 138 QT Interval:  360 QTC Calculation: 465 R Axis:   93 Text Interpretation: Sinus tachycardia RBBB and LPFB Probable inferior infarct, age indeterminate Lateral leads are also involved Baseline wander in lead(s) II III aVF V1 V3 V4 V5 V6 12 Lead; Mason-Likar No old tracing to compare Confirmed by Larry Cordova [phone]) on 01/18/2021 5:56:81 PM  87 year old male presents from home due to concern for dehydration.  Was seen by home health nurse who felt that he was dehydrated.  Wife states that he has had multiple falls over last few days and has had decreased oral intake.  Labs are pending at this time.  Patient's imaging is pending as well.  Will likely be admitted   Larry Leigh, MD 01/18/21 1732

## 2021-01-18 NOTE — H&P (Addendum)
History and Physical    Benoit R Bloodsworth SVX:[id number] DOB: 1930/03/31 DOA: 01/18/2021  PCP: [name], MD  Patient coming from: home via EMS   Chief Complaint:  Chief Complaint  Patient presents with  . Weakness     HPI:    87 year old male with past medical history of dementia, hypothyroidism senting to [facility] emergency department via EMS due toweakness, lethargy and frequent falls.  Patient possesses advanced dementia and is unable to provide a history.  Majority of history has been obtained from the emergency department staff as well as discussion with the patient's daughter via phone conversation.  Patient has been exhibiting evidence of increasing lethargy and generalized weakness over approximately 4 to 5 days.  This has been associated with extremely poor oral intake with both solids and liquids.  Daughter reports the patient has not exhibited any associated symptoms of shortness of breath or cough over the span of time.  In the past few days,the patient has been falling frequently.  Patient eventually was brought into [facility] emergency department via EMS for further evaluation.  Upon evaluation in the emergency department patient was found to be suffering from severe acute kidney injury with creatinine of 5.6.  Bladder scan revealed evidence of urinary retention with 962 cc of urine in the bladder requiring placement of a Foley catheter.  Furthermore patient was found to be Covid positive despite having no cough or shortness of breath. Patient was provided with 1 L of normal saline.  The hospitalist group was then called to assess the patient for mission to the hospital.  Review of Systems:   Review of Systems  Unable to perform ROS: Medical condition    Past Medical History:  Diagnosis Date  . Allergy   . Arthritis   . Depression   . Hypertension   . Kyphosis   . Spondylolisthesis 03/18/2017  . Thyroid disease   . Tinnitus      Past Surgical History:  Procedure Laterality Date  . HERNIA REPAIR       reports that he has quit smoking. His smoking use included cigarettes, pipe, and cigars. He quit after 30.00 years of use. He has never used smokeless tobacco. He reports that he does not drink alcohol and does not use drugs.  No Known Allergies  Family History  Problem Relation Age of Onset  . Kidney failure Mother   . Dementia Neg Hx      Prior to Admission medications   Medication Sig Start Date End Date Taking? Authorizing Provider  acetaminophen (TYLENOL) 500 MG tablet Take 1,000 mg by mouth every 6 (six) hours as needed for mild pain or headache.    [provider]  aspirin EC 81 MG tablet Take 81 mg by mouth daily.    [provider]  divalproex (DEPAKOTE ER) 250 MG 24 hr tablet TAKE 2 TABLETS(500 MG) BY MOUTH AT BEDTIME 08/17/20   Lomax, Amy, NP  levothyroxine (SYNTHROID) 112 MCG tablet TAKE 1 TABLET(112 MCG) BY MOUTH DAILY 04/01/20   [name], MD    Physical Exam: Vitals:   01/18/21 1934 01/18/21 2102 01/18/21 2345 01/19/21 0100  BP: 119/79  117/76 123/76  Pulse: 83  80 85  Resp: 17  (!) 23 (!) 23  Temp:  (!) 97.2 F (36.2 C)  97.9 F (36.6 C)  TempSrc:  Rectal  Oral  SpO2: 100%  100% 98%    Constitutional: Lethargic but arousable, oriented x1.  no associated distress.   Skin: no rashes, no lesions, extremely poor skin turgor noted.   Eyes: Pupils are equally reactive to light.  No evidence of scleral icterus or conjunctival pallor.  ENMT: Dry mucous membranes noted.  Posterior pharynx clear of any exudate or lesions.   Neck: normal, supple, no masses, no thyromegaly.  No evidence of jugular venous distension.   Respiratory: clear to auscultation bilaterally, no wheezing, no crackles. Normal respiratory effort. No accessory muscle use.  Cardiovascular: Regular rate and rhythm, no murmurs / rubs / gallops. No extremity edema. 2+ pedal pulses. No carotid bruits.   Chest:   Nontender without crepitus or deformity.   Back:   Nontender without crepitus or deformity. Abdomen: Abdomen is soft.  Question mild lower abdominal tenderness.  No evidence of intra-abdominal masses.  Positive bowel sounds noted in all quadrants.   Musculoskeletal: Notable poor muscle tone.  No joint deformity upper and lower extremities. Good ROM, no contractures. Normal muscle tone.  Neurologic: Patient is moving all 4 extremities spontaneously.  Patient does not consistently follow commands.  Patient is extremely lethargic and oriented x1.  Patient is responsive to verbal and painful stimuli. Psychiatric: Patient exhibits depressed mood with flat affect.  Patient currently does not seem to possess insight as to his current situation.   GU: Foley catheter in place draining dark yellow urine  Labs on Admission: I have personally reviewed following labs and imaging studies -   CBC: Recent Labs  Lab 01/18/21 1640  WBC 13.0*  NEUTROABS 11.1*  HGB 15.2  HCT 47.4  MCV 95.2  PLT 500*   Basic Metabolic Panel: Recent Labs  Lab 01/18/21 1640  NA 133*  K 4.6  CL 94*  CO2 20*  GLUCOSE 190*  BUN 59*  CREATININE 5.60*  CALCIUM 8.5*   GFR: CrCl cannot be calculated (Unknown ideal weight.). Liver Function Tests: Recent Labs  Lab 01/18/21 1640  AST 56*  ALT 30  ALKPHOS 58  BILITOT 1.3*  PROT 6.8  ALBUMIN 3.1*   No results for input(s): LIPASE, AMYLASE in the last 168 hours. No results for input(s): AMMONIA in the last 168 hours. Coagulation Profile: No results for input(s): INR, PROTIME in the last 168 hours. Cardiac Enzymes: No results for input(s): CKTOTAL, CKMB, CKMBINDEX, TROPONINI in the last 168 hours. BNP (last 3 results) No results for input(s): PROBNP in the last 8760 hours. HbA1C: No results for input(s): HGBA1C in the last 72 hours. CBG: Recent Labs  Lab 01/18/21 1305  GLUCAP 109*   Lipid Profile: No results for input(s): CHOL, HDL, LDLCALC,  TRIG, CHOLHDL, LDLDIRECT in the last 72 hours. Thyroid Function Tests: No results for input(s): TSH, T4TOTAL, FREET4, T3FREE, THYROIDAB in the last 72 hours. Anemia Panel: No results for input(s): VITAMINB12, FOLATE, FERRITIN, TIBC, IRON, RETICCTPCT in the last 72 hours. Urine analysis:    Component Value Date/Time   COLORURINE AMBER (A) 01/18/2021 2103   APPEARANCEUR CLEAR 01/18/2021 2103   LABSPEC 1.015 01/18/2021 2103   PHURINE 5.0 01/18/2021 2103   GLUCOSEU NEGATIVE 01/18/2021 2103   HGBUR MODERATE (A) 01/18/2021 2103   BILIRUBINUR NEGATIVE 01/18/2021 2103   BILIRUBINUR negative 12/07/2016 1850   BILIRUBINUR neg 07/29/2014 0906   KETONESUR NEGATIVE 01/18/2021 2103   PROTEINUR NEGATIVE 01/18/2021 2103   UROBILINOGEN 0.2 12/07/2016 1850   NITRITE NEGATIVE 01/18/2021 2103   LEUKOCYTESUR NEGATIVE 01/18/2021 2103    Radiological Exams on Admission - Personally Reviewed: CT Head Wo Contrast  Result Date: 01/18/2021  CLINICAL DATA:  Head trauma, moderate/severe. Facial trauma. Neck trauma. Additional history provided: Patient presents with weakness/malaise, history of dementia with increased falls recently, 3 falls on Saturday. EXAM: CT HEAD WITHOUT CONTRAST CT MAXILLOFACIAL WITHOUT CONTRAST CT CERVICAL SPINE WITHOUT CONTRAST TECHNIQUE: Multidetector CT imaging of the head, cervical spine, and maxillofacial structures were performed using the standard protocol without intravenous contrast. Multiplanar CT image reconstructions of the cervical spine and maxillofacial structures were also generated. COMPARISON:  Head CT 12/17/2016. FINDINGS: CT HEAD FINDINGS Brain: Mildly motion degraded exam. Moderate to moderately advanced cerebral atrophy. Comparatively mild cerebellar atrophy. Lateral and third ventriculomegaly, which may be due to from predominant atrophy. Mild patchy and ill-defined hypoattenuation within the cerebral white matter is nonspecific, but compatible with chronic small vessel  ischemic disease. There is no acute intracranial hemorrhage. No demarcated cortical infarct. No extra-axial fluid collection. No evidence of intracranial mass. No midline shift. Vascular: No hyperdense vessel. Skull: No hyperdense vessel. Atherosclerotic calcifications. Other: Possible small left occipital scalp hematoma. CT MAXILLOFACIAL FINDINGS Osseous: No acute maxillofacial fracture is identified. Orbits: No acute finding. The globes are normal in size and contour. The extraocular muscles and optic nerve sheath complexes are symmetric and unremarkable. Sinuses: Mild paranasal sinus mucosal thickening, most notably ethmoidal. Soft tissues: No maxillofacial soft tissue swelling appreciable by CT. Other: Poor dentition with multiple absent and carious teeth and multifocal periapical lucencies. CT CERVICAL SPINE FINDINGS Alignment: Straightening of the expected cervical lordosis. Trace fused C4-C5 grade 1 anterolisthesis. 3 mm C7-T1 grade 1 anterolisthesis. Skull base and vertebrae: The basion-dental and atlanto-dental intervals are maintained.No evidence of acute fracture to the cervical spine. Soft tissues and spinal canal: No prevertebral fluid or swelling. No visible canal hematoma. Disc levels: Cervical spondylosis with advanced multilevel disc space narrowing, disc bulges, posterior disc osteophytes, uncovertebral hypertrophy and facet arthrosis. Fusion across the C4-C5 and C6-C7 disc spaces. There may also be early osseous fusion across the C7-T1 disc space. Multilevel bridging ventral osteophytes. Facet joint ankylosis on the left at C4-C5 and bilaterally at C6-C7. Degenerative changes are also present at the C1-C2 articulation. Upper chest: No consolidation within the imaged lung apices. No visible pneumothorax. IMPRESSION: CT head: 1. Mildly motion degraded exam. 2. No acute posttraumatic intracranial findings. 3. Possible small left occipital scalp hematoma. 4. Moderate to moderately advanced cerebral  atrophy, progressed as compared to the head CT of 12/27/2016. 5. Lateral and third ventriculomegaly has also progressed. This may be due to central predominant atrophy. A component of communicating/normal pressure hydrocephalus is difficult to exclude. 6. Stable mild cerebral white matter chronic small vessel ischemic disease. CT maxillofacial: 1. No evidence of acute maxillofacial fracture. 2. Mild paranasal sinus mucosal thickening, most notably ethmoidal. CT cervical spine: 1. No evidence of acute fracture to the cervical spine. 2. 3 mm C7-T1 grade 1 anterolisthesis. 3. Advanced cervical spondylosis as described with multilevel degenerative fusion. Electronically Signed   By: Kellie Simmering DO   On: 01/18/2021 19:05   CT Cervical Spine Wo Contrast  Result Date: 01/18/2021 CLINICAL DATA:  Head trauma, moderate/severe. Facial trauma. Neck trauma. Additional history provided: Patient presents with weakness/malaise, history of dementia with increased falls recently, 3 falls on Saturday. EXAM: CT HEAD WITHOUT CONTRAST CT MAXILLOFACIAL WITHOUT CONTRAST CT CERVICAL SPINE WITHOUT CONTRAST TECHNIQUE: Multidetector CT imaging of the head, cervical spine, and maxillofacial structures were performed using the standard protocol without intravenous contrast. Multiplanar CT image reconstructions of the cervical spine and maxillofacial structures were also generated. COMPARISON:  Head  CT 12/17/2016. FINDINGS: CT HEAD FINDINGS Brain: Mildly motion degraded exam. Moderate to moderately advanced cerebral atrophy. Comparatively mild cerebellar atrophy. Lateral and third ventriculomegaly, which may be due to from predominant atrophy. Mild patchy and ill-defined hypoattenuation within the cerebral white matter is nonspecific, but compatible with chronic small vessel ischemic disease. There is no acute intracranial hemorrhage. No demarcated cortical infarct. No extra-axial fluid collection. No evidence of intracranial mass. No midline  shift. Vascular: No hyperdense vessel. Skull: No hyperdense vessel. Atherosclerotic calcifications. Other: Possible small left occipital scalp hematoma. CT MAXILLOFACIAL FINDINGS Osseous: No acute maxillofacial fracture is identified. Orbits: No acute finding. The globes are normal in size and contour. The extraocular muscles and optic nerve sheath complexes are symmetric and unremarkable. Sinuses: Mild paranasal sinus mucosal thickening, most notably ethmoidal. Soft tissues: No maxillofacial soft tissue swelling appreciable by CT. Other: Poor dentition with multiple absent and carious teeth and multifocal periapical lucencies. CT CERVICAL SPINE FINDINGS Alignment: Straightening of the expected cervical lordosis. Trace fused C4-C5 grade 1 anterolisthesis. 3 mm C7-T1 grade 1 anterolisthesis. Skull base and vertebrae: The basion-dental and atlanto-dental intervals are maintained.No evidence of acute fracture to the cervical spine. Soft tissues and spinal canal: No prevertebral fluid or swelling. No visible canal hematoma. Disc levels: Cervical spondylosis with advanced multilevel disc space narrowing, disc bulges, posterior disc osteophytes, uncovertebral hypertrophy and facet arthrosis. Fusion across the C4-C5 and C6-C7 disc spaces. There may also be early osseous fusion across the C7-T1 disc space. Multilevel bridging ventral osteophytes. Facet joint ankylosis on the left at C4-C5 and bilaterally at C6-C7. Degenerative changes are also present at the C1-C2 articulation. Upper chest: No consolidation within the imaged lung apices. No visible pneumothorax. IMPRESSION: CT head: 1. Mildly motion degraded exam. 2. No acute posttraumatic intracranial findings. 3. Possible small left occipital scalp hematoma. 4. Moderate to moderately advanced cerebral atrophy, progressed as compared to the head CT of 12/27/2016. 5. Lateral and third ventriculomegaly has also progressed. This may be due to central predominant atrophy. A  component of communicating/normal pressure hydrocephalus is difficult to exclude. 6. Stable mild cerebral white matter chronic small vessel ischemic disease. CT maxillofacial: 1. No evidence of acute maxillofacial fracture. 2. Mild paranasal sinus mucosal thickening, most notably ethmoidal. CT cervical spine: 1. No evidence of acute fracture to the cervical spine. 2. 3 mm C7-T1 grade 1 anterolisthesis. 3. Advanced cervical spondylosis as described with multilevel degenerative fusion. Electronically Signed   By: Kellie Simmering DO   On: 01/18/2021 19:05   US RENAL  Result Date: 01/18/2021 CLINICAL DATA:  Renal failure EXAM: RENAL / URINARY TRACT ULTRASOUND COMPLETE COMPARISON:  None. FINDINGS: Right Kidney: Renal measurements: 11.7 x 5.7 x 4.4 cm = volume: 153 mL. 1.2 cm cyst in the midpole. Normal echotexture. No hydronephrosis. Left Kidney: Renal measurements: 11.2 x 5.7 x 4.6 cm = volume: 152 mL. 1.4 cm cyst in the lower pole. Normal echotexture. No hydronephrosis. Bladder: Decompressed with Foley catheter in place. Other: None. IMPRESSION: No acute findings.  No hydronephrosis. Electronically Signed   By: Rolm Baptise M.D.   On: 01/18/2021 21:39   DG Chest Portable 1 View  Result Date: 01/18/2021 CLINICAL DATA:  Weakness, malaise for 1 week, dementia EXAM: PORTABLE CHEST 1 VIEW COMPARISON:  12/07/2016 FINDINGS: Single frontal view of the chest demonstrates an unremarkable cardiac silhouette. Lung volumes are diminished, with chronic scarring at the lung bases. Patchy consolidation at the left lung base likely represents atelectasis. No effusion or pneumothorax. No acute bony  abnormalities. IMPRESSION: 1. Low lung volumes.  No acute airspace disease. Electronically Signed   By: Randa Ngo M.D.   On: 01/18/2021 17:34   CT Maxillofacial Wo Contrast  Result Date: 01/18/2021 CLINICAL DATA:  Head trauma, moderate/severe. Facial trauma. Neck trauma. Additional history provided: Patient presents with  weakness/malaise, history of dementia with increased falls recently, 3 falls on Saturday. EXAM: CT HEAD WITHOUT CONTRAST CT MAXILLOFACIAL WITHOUT CONTRAST CT CERVICAL SPINE WITHOUT CONTRAST TECHNIQUE: Multidetector CT imaging of the head, cervical spine, and maxillofacial structures were performed using the standard protocol without intravenous contrast. Multiplanar CT image reconstructions of the cervical spine and maxillofacial structures were also generated. COMPARISON:  Head CT 12/17/2016. FINDINGS: CT HEAD FINDINGS Brain: Mildly motion degraded exam. Moderate to moderately advanced cerebral atrophy. Comparatively mild cerebellar atrophy. Lateral and third ventriculomegaly, which may be due to from predominant atrophy. Mild patchy and ill-defined hypoattenuation within the cerebral white matter is nonspecific, but compatible with chronic small vessel ischemic disease. There is no acute intracranial hemorrhage. No demarcated cortical infarct. No extra-axial fluid collection. No evidence of intracranial mass. No midline shift. Vascular: No hyperdense vessel. Skull: No hyperdense vessel. Atherosclerotic calcifications. Other: Possible small left occipital scalp hematoma. CT MAXILLOFACIAL FINDINGS Osseous: No acute maxillofacial fracture is identified. Orbits: No acute finding. The globes are normal in size and contour. The extraocular muscles and optic nerve sheath complexes are symmetric and unremarkable. Sinuses: Mild paranasal sinus mucosal thickening, most notably ethmoidal. Soft tissues: No maxillofacial soft tissue swelling appreciable by CT. Other: Poor dentition with multiple absent and carious teeth and multifocal periapical lucencies. CT CERVICAL SPINE FINDINGS Alignment: Straightening of the expected cervical lordosis. Trace fused C4-C5 grade 1 anterolisthesis. 3 mm C7-T1 grade 1 anterolisthesis. Skull base and vertebrae: The basion-dental and atlanto-dental intervals are maintained.No evidence of acute  fracture to the cervical spine. Soft tissues and spinal canal: No prevertebral fluid or swelling. No visible canal hematoma. Disc levels: Cervical spondylosis with advanced multilevel disc space narrowing, disc bulges, posterior disc osteophytes, uncovertebral hypertrophy and facet arthrosis. Fusion across the C4-C5 and C6-C7 disc spaces. There may also be early osseous fusion across the C7-T1 disc space. Multilevel bridging ventral osteophytes. Facet joint ankylosis on the left at C4-C5 and bilaterally at C6-C7. Degenerative changes are also present at the C1-C2 articulation. Upper chest: No consolidation within the imaged lung apices. No visible pneumothorax. IMPRESSION: CT head: 1. Mildly motion degraded exam. 2. No acute posttraumatic intracranial findings. 3. Possible small left occipital scalp hematoma. 4. Moderate to moderately advanced cerebral atrophy, progressed as compared to the head CT of 12/27/2016. 5. Lateral and third ventriculomegaly has also progressed. This may be due to central predominant atrophy. A component of communicating/normal pressure hydrocephalus is difficult to exclude. 6. Stable mild cerebral white matter chronic small vessel ischemic disease. CT maxillofacial: 1. No evidence of acute maxillofacial fracture. 2. Mild paranasal sinus mucosal thickening, most notably ethmoidal. CT cervical spine: 1. No evidence of acute fracture to the cervical spine. 2. 3 mm C7-T1 grade 1 anterolisthesis. 3. Advanced cervical spondylosis as described with multilevel degenerative fusion. Electronically Signed   By: Kellie Simmering DO   On: 01/18/2021 19:05    EKG: Personally reviewed.  Rhythm is sinus tachycardia with heart rate of 100 bpm.  Evidence of right bundle branch block. no dynamic ST segment changes appreciated.  Assessment/Plan Principal Problem:   AKI (acute kidney injury) ([place])  Patient presenting with severe acute kidney injury and creatinine of 5.6, up from previous  creatinine of  1.02  Multifactorial etiology, likely secondary to poor oral intake over the past several days in addition to postobstructive uropathy  Foley catheter has been successfully placed in the emergency department with at least 1000 cc of urine output after placement  Obtaining urine electrolytes and renal ultrasound  Hydrating patient with intravenous isotonic fluids  Strict input and output monitoring  Monitoring renal function and electrolytes with serial chemistries  Active Problems: COVID-19 virus infection  Patient found to be Covid positive after approximately 5 days of increasing lethargy and poor oral intake  Patient does not exhibit any respiratory symptoms however and is currently on room air saturating at 100%.  We will initiate 3-day remdesivir infusion protocol  Monitoring closely for any evidence of clinical decline  Airborne and contact isolation for now, will likely use a 10-day isolation protocol    Acute urinary retention   Patient found to have substantial urinary retention with 962 cc noted to be in the bladder at time of Foley insertion  Urinalysis reveals no evidence of urinary tract infection  Considering patient's age most likely culprit of urinary retention is BPH  Will leave Foley in for now.  Based on clinical course consideration can be undertaken for in-house voiding trial versus indwelling Foley catheter with outpatient urology follow-up  Initiating Flomax daily    Lactic acidosis   Patient exhibiting significant lactic acidosis at 3.8  This is likely secondary to volume depletion  Hydrating patient with intravenous isotonic fluids  Monitoring lactic acid levels to ensure downtrending and resolution    Hypothyroidism   Continuing home regimen of Synthroid    Dementia with behavioral disturbance (HCC)   Minimizing uncomfortable stimulation as much as possible  Frequent redirection  Falls precautions    Leukocytosis  Chest  x-ray reveals no evidence of acute infiltrate  Urinalysis not suggestive of urinary tract infection  No obvious clinical evidence of bacterial infection.  Continue to monitor    Dehydration with hyponatremia   Patient exhibiting mild hyponatremia on chemistry consistent with volume depletion  Hydrating patient with intravenous isotonic fluids  Monitoring sodium levels with serial chemistries    Elevated troponin level not due myocardial infarction    Notable mild elevation of troponin in the absence of EKG changes or chest pain  Likely secondary to significant renal injury in the setting of mild supply demand mismatch.  Monitoring patient on telemetry  Cycling cardiac enzymes   Code Status:  Full code Family Communication: I have attempted to call the wife and have been unsuccessful.  I was able to successfully speak to the daughter via her cell phone number.  Daughter does not know patient's CODE STATUS and therefore patient will be kept full code at this time.  Status is: Inpatient  Remains inpatient appropriate because:Ongoing diagnostic testing needed not appropriate for outpatient work up, IV treatments appropriate due to intensity of illness or inability to take PO and Inpatient level of care appropriate due to severity of illness   Dispo: The patient is from: Home              Anticipated d/c is to: Home              Anticipated d/c date is: > 3 days              Patient currently is not medically stable to d/c.   Difficult to place patient yes        Vernelle Emerald MD [facility] Pager [phone]  If 7PM-7AM, please contact night-coverage [email or web address] Use universal [place] password for that web site. If you do not have the password, please call the hospital operator.  01/19/2021, 1:42 AM

## 2021-01-18 NOTE — Progress Notes (Signed)
Attempted to call pt's wife in order to complete nursing admission history. She does not answer her land line or her call phone. Lucius Conn BSN, RN-BC Admissions RN  01/18/2021  8:50 PM

## 2021-01-18 NOTE — ED Provider Notes (Addendum)
[facility] DEPT Provider Note   CSN: [id number] Arrival date & time: 01/18/21  1250     History Chief Complaint  Patient presents with  . Weakness    Larry Cordova is a 87 y.o. male.  HPI   87 y/o male with a h/o allergy, arthritis, depression, HTN, kyphosis, thyroid disease, dementia, who presents to the ED today for eval of weakness.   Level 5 caveat as pt has a h/o dementia.  5:00 PM Attempted to contact pt's wife's mobile number, however the number is the chart is not accurate. Attempted to call home number and reached voicemail. Also attempted to call patient's daughter without success and there was no voicemail set up.  Wife later at bedside. She states that the pt has had multiple falls over the last week.  States his last fall was 4 days ago but he had 3 falls that day.  He did sustain head trauma.  She does not report any LOC.  She states that he is demented at baseline.  She thinks he has been eating and drinking normally.  He has had some diarrhea but no vomiting.  He is not been complaining of anything.  He has not had a cough.  Has not had any fever.  She states that there is a home health nurse that comes out once a year to check on him and this person came today and felt that he looked dehydrated so sent him here for further evaluation.  Past Medical History:  Diagnosis Date  . Allergy   . Arthritis   . Depression   . Hypertension   . Kyphosis   . Spondylolisthesis 03/18/2017  . Thyroid disease   . Tinnitus     Patient Active Problem List   Diagnosis Date Noted  . Acute renal injury ([place]) 01/18/2021  . Leukocytosis 01/18/2021  . Dehydration with hyponatremia 01/18/2021  . PAD (peripheral artery disease) ([place]) 08/18/2019  . Dementia with behavioral disturbance ([place]) 05/18/2019  . Spondylolisthesis 03/18/2017  . Frontotemporal dementia ([place]) 01/03/2017  . Skin cancer 07/29/2014  . Other and unspecified hyperlipidemia  07/29/2014  . Arthritis 07/13/2013  . Hypothyroidism 06/16/2012  . HTN (hypertension) 06/16/2012    Past Surgical History:  Procedure Laterality Date  . HERNIA REPAIR         Family History  Problem Relation Age of Onset  . Kidney failure Mother   . Dementia Neg Hx     Social History   Tobacco Use  . Smoking status: Former Smoker    Years: 30.00    Types: Cigarettes, Pipe, Cigars  . Smokeless tobacco: Never Used  Substance Use Topics  . Alcohol use: No  . Drug use: No    Home Medications Prior to Admission medications   Medication Sig Start Date End Date Taking? Authorizing Provider  acetaminophen (TYLENOL) 500 MG tablet Take 1,000 mg by mouth every 6 (six) hours as needed for mild pain or headache.    [provider]  aspirin EC 81 MG tablet Take 81 mg by mouth daily.    [provider]  divalproex (DEPAKOTE ER) 250 MG 24 hr tablet TAKE 2 TABLETS(500 MG) BY MOUTH AT BEDTIME 08/17/20   Lomax, Amy, NP  levothyroxine (SYNTHROID) 112 MCG tablet TAKE 1 TABLET(112 MCG) BY MOUTH DAILY 04/01/20   [name], MD    Allergies    Patient has no known allergies.  Review of Systems   Review of Systems  Unable  to perform ROS: Dementia    Physical Exam Updated Vital Signs BP 119/79   Pulse 83   Temp (!) 96.4 F (35.8 C) (Rectal)   Resp 17   SpO2 100%   Physical Exam Vitals and nursing note reviewed.  Constitutional:      Appearance: He is well-developed and well-nourished.  HENT:     Head: Normocephalic.     Comments: Ecchymosis to the right orbital area. No ttp along the nasal bridge or to the remainder of the facial bones Eyes:     Conjunctiva/sclera: Conjunctivae normal.  Cardiovascular:     Rate and Rhythm: Normal rate and regular rhythm.     Heart sounds: Normal heart sounds. No murmur heard.   Pulmonary:     Effort: Pulmonary effort is normal. No respiratory distress.     Breath sounds: Normal breath sounds. No wheezing, rhonchi  or rales.  Chest:     Chest wall: No tenderness.  Abdominal:     General: Bowel sounds are normal.     Palpations: Abdomen is soft.     Tenderness: There is no abdominal tenderness. There is no guarding or rebound.  Musculoskeletal:        General: No edema.     Cervical back: Neck supple.     Comments: Pelvis stable to compression. No TTP to the bilat hips and no pain with ROM of the bilat hips.   Skin:    General: Skin is warm and dry.  Neurological:     Mental Status: He is alert.     Comments: Alert, demented, only able to follow simple commands and requires frequent redirection. Moving all extremities with symmetric strength.   Psychiatric:        Mood and Affect: Mood and affect normal.     ED Results / Procedures / Treatments   Labs (all labs ordered are listed, but only abnormal results are displayed) Labs Reviewed  RESP PANEL BY RT-PCR (FLU A&B, COVID) ARPGX2 - Abnormal; Notable for the following components:      Result Value   SARS Coronavirus 2 by RT PCR POSITIVE (*)    All other components within normal limits  CBC WITH DIFFERENTIAL/PLATELET - Abnormal; Notable for the following components:   WBC 13.0 (*)    Platelets 115 (*)    Neutro Abs 11.1 (*)    Abs Immature Granulocytes 0.18 (*)    All other components within normal limits  COMPREHENSIVE METABOLIC PANEL - Abnormal; Notable for the following components:   Sodium 133 (*)    Chloride 94 (*)    CO2 20 (*)    Glucose, Bld 190 (*)    BUN 59 (*)    Creatinine, Ser 5.60 (*)    Calcium 8.5 (*)    Albumin 3.1 (*)    AST 56 (*)    Total Bilirubin 1.3 (*)    GFR, Estimated 9 (*)    Anion gap 19 (*)    All other components within normal limits  CBG MONITORING, ED - Abnormal; Notable for the following components:   Glucose-Capillary 109 (*)    All other components within normal limits  TROPONIN I (HIGH SENSITIVITY) - Abnormal; Notable for the following components:   Troponin I (High Sensitivity) 31 (*)     All other components within normal limits  TROPONIN I (HIGH SENSITIVITY) - Abnormal; Notable for the following components:   Troponin I (High Sensitivity) 33 (*)    All other components within normal limits  URINE CULTURE  CULTURE, BLOOD (ROUTINE X 2)  CULTURE, BLOOD (ROUTINE X 2)  URINALYSIS, ROUTINE W REFLEX MICROSCOPIC  LACTIC ACID, PLASMA  LACTIC ACID, PLASMA  PROCALCITONIN  PROCALCITONIN    EKG EKG Interpretation  Date/Time:  Wednesday January 18 2021 13:01:14 EST Ventricular Rate:  100 PR Interval:    QRS Duration: 138 QT Interval:  360 QTC Calculation: 465 R Axis:   93 Text Interpretation: Sinus tachycardia RBBB and LPFB Probable inferior infarct, age indeterminate Lateral leads are also involved Baseline wander in lead(s) II III aVF V1 V3 V4 V5 V6 12 Lead; Mason-Likar No old tracing to compare Confirmed by [name] ([id number]) on 01/18/2021 5:23:41 PM   Radiology CT Head Wo Contrast  Result Date: 01/18/2021 CLINICAL DATA:  Head trauma, moderate/severe. Facial trauma. Neck trauma. Additional history provided: Patient presents with weakness/malaise, history of dementia with increased falls recently, 3 falls on Saturday. EXAM: CT HEAD WITHOUT CONTRAST CT MAXILLOFACIAL WITHOUT CONTRAST CT CERVICAL SPINE WITHOUT CONTRAST TECHNIQUE: Multidetector CT imaging of the head, cervical spine, and maxillofacial structures were performed using the standard protocol without intravenous contrast. Multiplanar CT image reconstructions of the cervical spine and maxillofacial structures were also generated. COMPARISON:  Head CT 12/17/2016. FINDINGS: CT HEAD FINDINGS Brain: Mildly motion degraded exam. Moderate to moderately advanced cerebral atrophy. Comparatively mild cerebellar atrophy. Lateral and third ventriculomegaly, which may be due to from predominant atrophy. Mild patchy and ill-defined hypoattenuation within the cerebral white matter is nonspecific, but compatible with chronic small  vessel ischemic disease. There is no acute intracranial hemorrhage. No demarcated cortical infarct. No extra-axial fluid collection. No evidence of intracranial mass. No midline shift. Vascular: No hyperdense vessel. Skull: No hyperdense vessel. Atherosclerotic calcifications. Other: Possible small left occipital scalp hematoma. CT MAXILLOFACIAL FINDINGS Osseous: No acute maxillofacial fracture is identified. Orbits: No acute finding. The globes are normal in size and contour. The extraocular muscles and optic nerve sheath complexes are symmetric and unremarkable. Sinuses: Mild paranasal sinus mucosal thickening, most notably ethmoidal. Soft tissues: No maxillofacial soft tissue swelling appreciable by CT. Other: Poor dentition with multiple absent and carious teeth and multifocal periapical lucencies. CT CERVICAL SPINE FINDINGS Alignment: Straightening of the expected cervical lordosis. Trace fused C4-C5 grade 1 anterolisthesis. 3 mm C7-T1 grade 1 anterolisthesis. Skull base and vertebrae: The basion-dental and atlanto-dental intervals are maintained.No evidence of acute fracture to the cervical spine. Soft tissues and spinal canal: No prevertebral fluid or swelling. No visible canal hematoma. Disc levels: Cervical spondylosis with advanced multilevel disc space narrowing, disc bulges, posterior disc osteophytes, uncovertebral hypertrophy and facet arthrosis. Fusion across the C4-C5 and C6-C7 disc spaces. There may also be early osseous fusion across the C7-T1 disc space. Multilevel bridging ventral osteophytes. Facet joint ankylosis on the left at C4-C5 and bilaterally at C6-C7. Degenerative changes are also present at the C1-C2 articulation. Upper chest: No consolidation within the imaged lung apices. No visible pneumothorax. IMPRESSION: CT head: 1. Mildly motion degraded exam. 2. No acute posttraumatic intracranial findings. 3. Possible small left occipital scalp hematoma. 4. Moderate to moderately advanced  cerebral atrophy, progressed as compared to the head CT of 12/27/2016. 5. Lateral and third ventriculomegaly has also progressed. This may be due to central predominant atrophy. A component of communicating/normal pressure hydrocephalus is difficult to exclude. 6. Stable mild cerebral white matter chronic small vessel ischemic disease. CT maxillofacial: 1. No evidence of acute maxillofacial fracture. 2. Mild paranasal sinus mucosal thickening, most notably ethmoidal. CT cervical spine: 1. No evidence of  acute fracture to the cervical spine. 2. 3 mm C7-T1 grade 1 anterolisthesis. 3. Advanced cervical spondylosis as described with multilevel degenerative fusion. Electronically Signed   By: Kellie Simmering DO   On: 01/18/2021 19:05   CT Cervical Spine Wo Contrast  Result Date: 01/18/2021 CLINICAL DATA:  Head trauma, moderate/severe. Facial trauma. Neck trauma. Additional history provided: Patient presents with weakness/malaise, history of dementia with increased falls recently, 3 falls on Saturday. EXAM: CT HEAD WITHOUT CONTRAST CT MAXILLOFACIAL WITHOUT CONTRAST CT CERVICAL SPINE WITHOUT CONTRAST TECHNIQUE: Multidetector CT imaging of the head, cervical spine, and maxillofacial structures were performed using the standard protocol without intravenous contrast. Multiplanar CT image reconstructions of the cervical spine and maxillofacial structures were also generated. COMPARISON:  Head CT 12/17/2016. FINDINGS: CT HEAD FINDINGS Brain: Mildly motion degraded exam. Moderate to moderately advanced cerebral atrophy. Comparatively mild cerebellar atrophy. Lateral and third ventriculomegaly, which may be due to from predominant atrophy. Mild patchy and ill-defined hypoattenuation within the cerebral white matter is nonspecific, but compatible with chronic small vessel ischemic disease. There is no acute intracranial hemorrhage. No demarcated cortical infarct. No extra-axial fluid collection. No evidence of intracranial mass.  No midline shift. Vascular: No hyperdense vessel. Skull: No hyperdense vessel. Atherosclerotic calcifications. Other: Possible small left occipital scalp hematoma. CT MAXILLOFACIAL FINDINGS Osseous: No acute maxillofacial fracture is identified. Orbits: No acute finding. The globes are normal in size and contour. The extraocular muscles and optic nerve sheath complexes are symmetric and unremarkable. Sinuses: Mild paranasal sinus mucosal thickening, most notably ethmoidal. Soft tissues: No maxillofacial soft tissue swelling appreciable by CT. Other: Poor dentition with multiple absent and carious teeth and multifocal periapical lucencies. CT CERVICAL SPINE FINDINGS Alignment: Straightening of the expected cervical lordosis. Trace fused C4-C5 grade 1 anterolisthesis. 3 mm C7-T1 grade 1 anterolisthesis. Skull base and vertebrae: The basion-dental and atlanto-dental intervals are maintained.No evidence of acute fracture to the cervical spine. Soft tissues and spinal canal: No prevertebral fluid or swelling. No visible canal hematoma. Disc levels: Cervical spondylosis with advanced multilevel disc space narrowing, disc bulges, posterior disc osteophytes, uncovertebral hypertrophy and facet arthrosis. Fusion across the C4-C5 and C6-C7 disc spaces. There may also be early osseous fusion across the C7-T1 disc space. Multilevel bridging ventral osteophytes. Facet joint ankylosis on the left at C4-C5 and bilaterally at C6-C7. Degenerative changes are also present at the C1-C2 articulation. Upper chest: No consolidation within the imaged lung apices. No visible pneumothorax. IMPRESSION: CT head: 1. Mildly motion degraded exam. 2. No acute posttraumatic intracranial findings. 3. Possible small left occipital scalp hematoma. 4. Moderate to moderately advanced cerebral atrophy, progressed as compared to the head CT of 12/27/2016. 5. Lateral and third ventriculomegaly has also progressed. This may be due to central predominant  atrophy. A component of communicating/normal pressure hydrocephalus is difficult to exclude. 6. Stable mild cerebral white matter chronic small vessel ischemic disease. CT maxillofacial: 1. No evidence of acute maxillofacial fracture. 2. Mild paranasal sinus mucosal thickening, most notably ethmoidal. CT cervical spine: 1. No evidence of acute fracture to the cervical spine. 2. 3 mm C7-T1 grade 1 anterolisthesis. 3. Advanced cervical spondylosis as described with multilevel degenerative fusion. Electronically Signed   By: Kellie Simmering DO   On: 01/18/2021 19:05   DG Chest Portable 1 View  Result Date: 01/18/2021 CLINICAL DATA:  Weakness, malaise for 1 week, dementia EXAM: PORTABLE CHEST 1 VIEW COMPARISON:  12/07/2016 FINDINGS: Single frontal view of the chest demonstrates an unremarkable cardiac silhouette. Lung volumes are  diminished, with chronic scarring at the lung bases. Patchy consolidation at the left lung base likely represents atelectasis. No effusion or pneumothorax. No acute bony abnormalities. IMPRESSION: 1. Low lung volumes.  No acute airspace disease. Electronically Signed   By: Randa Ngo M.D.   On: 01/18/2021 17:34   CT Maxillofacial Wo Contrast  Result Date: 01/18/2021 CLINICAL DATA:  Head trauma, moderate/severe. Facial trauma. Neck trauma. Additional history provided: Patient presents with weakness/malaise, history of dementia with increased falls recently, 3 falls on Saturday. EXAM: CT HEAD WITHOUT CONTRAST CT MAXILLOFACIAL WITHOUT CONTRAST CT CERVICAL SPINE WITHOUT CONTRAST TECHNIQUE: Multidetector CT imaging of the head, cervical spine, and maxillofacial structures were performed using the standard protocol without intravenous contrast. Multiplanar CT image reconstructions of the cervical spine and maxillofacial structures were also generated. COMPARISON:  Head CT 12/17/2016. FINDINGS: CT HEAD FINDINGS Brain: Mildly motion degraded exam. Moderate to moderately advanced cerebral  atrophy. Comparatively mild cerebellar atrophy. Lateral and third ventriculomegaly, which may be due to from predominant atrophy. Mild patchy and ill-defined hypoattenuation within the cerebral white matter is nonspecific, but compatible with chronic small vessel ischemic disease. There is no acute intracranial hemorrhage. No demarcated cortical infarct. No extra-axial fluid collection. No evidence of intracranial mass. No midline shift. Vascular: No hyperdense vessel. Skull: No hyperdense vessel. Atherosclerotic calcifications. Other: Possible small left occipital scalp hematoma. CT MAXILLOFACIAL FINDINGS Osseous: No acute maxillofacial fracture is identified. Orbits: No acute finding. The globes are normal in size and contour. The extraocular muscles and optic nerve sheath complexes are symmetric and unremarkable. Sinuses: Mild paranasal sinus mucosal thickening, most notably ethmoidal. Soft tissues: No maxillofacial soft tissue swelling appreciable by CT. Other: Poor dentition with multiple absent and carious teeth and multifocal periapical lucencies. CT CERVICAL SPINE FINDINGS Alignment: Straightening of the expected cervical lordosis. Trace fused C4-C5 grade 1 anterolisthesis. 3 mm C7-T1 grade 1 anterolisthesis. Skull base and vertebrae: The basion-dental and atlanto-dental intervals are maintained.No evidence of acute fracture to the cervical spine. Soft tissues and spinal canal: No prevertebral fluid or swelling. No visible canal hematoma. Disc levels: Cervical spondylosis with advanced multilevel disc space narrowing, disc bulges, posterior disc osteophytes, uncovertebral hypertrophy and facet arthrosis. Fusion across the C4-C5 and C6-C7 disc spaces. There may also be early osseous fusion across the C7-T1 disc space. Multilevel bridging ventral osteophytes. Facet joint ankylosis on the left at C4-C5 and bilaterally at C6-C7. Degenerative changes are also present at the C1-C2 articulation. Upper chest: No  consolidation within the imaged lung apices. No visible pneumothorax. IMPRESSION: CT head: 1. Mildly motion degraded exam. 2. No acute posttraumatic intracranial findings. 3. Possible small left occipital scalp hematoma. 4. Moderate to moderately advanced cerebral atrophy, progressed as compared to the head CT of 12/27/2016. 5. Lateral and third ventriculomegaly has also progressed. This may be due to central predominant atrophy. A component of communicating/normal pressure hydrocephalus is difficult to exclude. 6. Stable mild cerebral white matter chronic small vessel ischemic disease. CT maxillofacial: 1. No evidence of acute maxillofacial fracture. 2. Mild paranasal sinus mucosal thickening, most notably ethmoidal. CT cervical spine: 1. No evidence of acute fracture to the cervical spine. 2. 3 mm C7-T1 grade 1 anterolisthesis. 3. Advanced cervical spondylosis as described with multilevel degenerative fusion. Electronically Signed   By: Kellie Simmering DO   On: 01/18/2021 19:05    Procedures Procedures   Medications Ordered in ED Medications  0.9 %  sodium chloride infusion (has no administration in time range)  sodium chloride 0.9 % bolus  1,000 mL (0 mLs Intravenous Stopped 01/18/21 1840)    ED Course  I have reviewed the triage vital signs and the nursing notes.  Pertinent labs & imaging results that were available during my care of the patient were reviewed by me and considered in my medical decision making (see chart for details).    MDM Rules/Calculators/A&P                          87 y/o M presenting to the ED today for eval after mult falls. Seen by home health provider and felt to be dehydrated to sent here for further eval  Reviewed/interpreted labs CBC with mild leukocytosis, no anemia CMP with mild hyponatremia and hypochloremia, low bicarb at 20 and elevated anion gap at 19, elevated bun/cr, lfts wnl Trop marginally elevated which is likely due to demand covid is positive UA  pending on admission Lactic pending procalcitonin pending  EKG - Sinus tachycardia RBBB and LPFB Probable inferior infarct, age indeterminate Lateral leads are also involved Baseline wander in lead(s) II III aVF V1 V3 V4 V5 V6 12   Reviewed/interpreted imaging CXR -  Low lung volumes.  No acute airspace disease.  CT head/maxillofacial/cervical spine - CT head: 1. Mildly motion degraded exam. 2. No acute posttraumatic intracranial findings. 3. Possible small left occipital scalp hematoma. 4. Moderate to moderately advanced cerebral atrophy, progressed as compared to the head CT of 12/27/2016. 5. Lateral and third ventriculomegaly has also progressed. This may be due to central predominant atrophy. A component of communicating/normal pressure hydrocephalus is difficult to exclude. 6. Stable mild cerebral white matter chronic small vessel ischemic disease. CT maxillofacial: 1. No evidence of acute maxillofacial fracture. 2. Mild paranasal sinus mucosal thickening, most notably ethmoidal. CT cervical spine: 1. No evidence of acute fracture to the cervical spine. 2. 3 mm C7-T1 grade 1 anterolisthesis. 3. Advanced cervical spondylosis as described with multilevel degenerative fusion.   Bladder scan completed and showed 962cc, foley catheter was ordered  7:54 PM CONSULT with Dr. Cyndy Freeze who states that he would not recommend surgical intervention for the patients CT findings.   8:26 PM CONSULT with Dr. Cyd Silence who accepts patient for admission  Final Clinical Impression(s) / ED Diagnoses Final diagnoses:  AKI (acute kidney injury) ([place])  COVID  Multiple falls  Urinary retention    Rx / DC Orders ED Discharge Orders    None       Rodney Booze, PA-C 01/18/21 2042    Bishop [place] 01/18/21 2045    [name], MD 01/18/21 2159

## 2021-01-19 ENCOUNTER — Other Ambulatory Visit: Payer: Self-pay

## 2021-01-19 DIAGNOSIS — U071 COVID-19: Secondary | ICD-10-CM | POA: Diagnosis present

## 2021-01-19 DIAGNOSIS — R778 Other specified abnormalities of plasma proteins: Secondary | ICD-10-CM | POA: Diagnosis present

## 2021-01-19 DIAGNOSIS — N179 Acute kidney failure, unspecified: Secondary | ICD-10-CM | POA: Diagnosis not present

## 2021-01-19 LAB — CBC WITH DIFFERENTIAL/PLATELET
Abs Immature Granulocytes: 0.05 10*3/uL (ref 0.00–0.07)
Basophils Absolute: 0 10*3/uL (ref 0.0–0.1)
Basophils Relative: 0 %
Eosinophils Absolute: 0 10*3/uL (ref 0.0–0.5)
Eosinophils Relative: 0 %
HCT: 37.7 % — ABNORMAL LOW (ref 39.0–52.0)
Hemoglobin: 12.5 g/dL — ABNORMAL LOW (ref 13.0–17.0)
Immature Granulocytes: 1 %
Lymphocytes Relative: 18 %
Lymphs Abs: 1.4 10*3/uL (ref 0.7–4.0)
MCH: 31.1 pg (ref 26.0–34.0)
MCHC: 33.2 g/dL (ref 30.0–36.0)
MCV: 93.8 fL (ref 80.0–100.0)
Monocytes Absolute: 0.7 10*3/uL (ref 0.1–1.0)
Monocytes Relative: 8 %
Neutro Abs: 5.8 10*3/uL (ref 1.7–7.7)
Neutrophils Relative %: 73 %
Platelets: 87 10*3/uL — ABNORMAL LOW (ref 150–400)
RBC: 4.02 MIL/uL — ABNORMAL LOW (ref 4.22–5.81)
RDW: 13.2 % (ref 11.5–15.5)
WBC: 7.9 10*3/uL (ref 4.0–10.5)
nRBC: 0 % (ref 0.0–0.2)

## 2021-01-19 LAB — COMPREHENSIVE METABOLIC PANEL
ALT: 24 U/L (ref 0–44)
AST: 43 U/L — ABNORMAL HIGH (ref 15–41)
Albumin: 2.3 g/dL — ABNORMAL LOW (ref 3.5–5.0)
Alkaline Phosphatase: 42 U/L (ref 38–126)
Anion gap: 11 (ref 5–15)
BUN: 44 mg/dL — ABNORMAL HIGH (ref 8–23)
CO2: 24 mmol/L (ref 22–32)
Calcium: 7.6 mg/dL — ABNORMAL LOW (ref 8.9–10.3)
Chloride: 103 mmol/L (ref 98–111)
Creatinine, Ser: 3.03 mg/dL — ABNORMAL HIGH (ref 0.61–1.24)
GFR, Estimated: 19 mL/min — ABNORMAL LOW (ref >60)
Glucose, Bld: 75 mg/dL (ref 70–99)
Potassium: 3.9 mmol/L (ref 3.5–5.1)
Sodium: 138 mmol/L (ref 135–145)
Total Bilirubin: 0.9 mg/dL (ref 0.3–1.2)
Total Protein: 4.9 g/dL — ABNORMAL LOW (ref 6.5–8.1)

## 2021-01-19 LAB — BLOOD CULTURE ID PANEL (REFLEXED) - BCID2

## 2021-01-19 LAB — PROCALCITONIN: Procalcitonin: 1.2 ng/mL

## 2021-01-19 LAB — TROPONIN I (HIGH SENSITIVITY): Troponin I (High Sensitivity): 27 ng/L — ABNORMAL HIGH (ref <18)

## 2021-01-19 LAB — LACTIC ACID, PLASMA: Lactic Acid, Venous: 1.2 mmol/L (ref 0.5–1.9)

## 2021-01-19 LAB — D-DIMER, QUANTITATIVE: D-Dimer, Quant: >20 ug/mL-FEU — ABNORMAL HIGH (ref 0.00–0.50)

## 2021-01-19 LAB — C-REACTIVE PROTEIN: CRP: 9.2 mg/dL — ABNORMAL HIGH (ref <1.0)

## 2021-01-19 LAB — MAGNESIUM: Magnesium: 2.1 mg/dL (ref 1.7–2.4)

## 2021-01-19 MED ORDER — ADULT MULTIVITAMIN W/MINERALS CH
1.0000 | ORAL_TABLET | Freq: Every day | ORAL | Status: DC
  Administered 2021-01-19 – 2021-01-23 (×5): 1 via ORAL
  Filled 2021-01-19 (×5): qty 1

## 2021-01-19 MED ORDER — VANCOMYCIN HCL 1500 MG/300ML IV SOLN
1500.0000 mg | Freq: Once | INTRAVENOUS | Status: AC
  Administered 2021-01-19: 1500 mg via INTRAVENOUS
  Filled 2021-01-19: qty 300

## 2021-01-19 MED ORDER — ENSURE ENLIVE PO LIQD
237.0000 mL | Freq: Two times a day (BID) | ORAL | Status: DC
  Administered 2021-01-19 – 2021-01-23 (×8): 237 mL via ORAL

## 2021-01-19 MED ORDER — SODIUM CHLORIDE 0.9 % IV SOLN: 200.0000 mg | Freq: Once | INTRAVENOUS | Status: DC

## 2021-01-19 MED ORDER — CHLORHEXIDINE GLUCONATE CLOTH 2 % EX PADS
6.0000 | MEDICATED_PAD | Freq: Every day | CUTANEOUS | Status: DC
  Administered 2021-01-20 – 2021-01-23 (×4): 6 via TOPICAL

## 2021-01-19 MED ORDER — SODIUM CHLORIDE 0.9 % IV SOLN: 100.0000 mg | Freq: Every day | INTRAVENOUS | Status: DC

## 2021-01-19 MED ORDER — PROSOURCE PLUS PO LIQD
30.0000 mL | Freq: Every day | ORAL | Status: DC
  Administered 2021-01-19 – 2021-01-22 (×4): 30 mL via ORAL
  Filled 2021-01-19 (×2): qty 30

## 2021-01-19 MED ORDER — DIVALPROEX SODIUM 125 MG PO CSDR
500.0000 mg | DELAYED_RELEASE_CAPSULE | Freq: Once | ORAL | Status: AC
  Administered 2021-01-19: 500 mg via ORAL
  Filled 2021-01-19: qty 4

## 2021-01-19 MED ORDER — LORAZEPAM 1 MG PO TABS
1.0000 mg | ORAL_TABLET | Freq: Once | ORAL | Status: AC
  Administered 2021-01-19: 1 mg via ORAL
  Filled 2021-01-19: qty 1

## 2021-01-19 MED ORDER — VANCOMYCIN VARIABLE DOSE PER UNSTABLE RENAL FUNCTION (PHARMACIST DOSING): Status: DC

## 2021-01-19 MED ORDER — TAMSULOSIN HCL 0.4 MG PO CAPS
0.4000 mg | ORAL_CAPSULE | Freq: Every day | ORAL | Status: DC
  Administered 2021-01-19 – 2021-01-23 (×5): 0.4 mg via ORAL
  Filled 2021-01-19 (×5): qty 1

## 2021-01-19 MED ORDER — SODIUM CHLORIDE 0.9 % IV SOLN
200.0000 mg | Freq: Once | INTRAVENOUS | Status: AC
  Administered 2021-01-19: 200 mg via INTRAVENOUS
  Filled 2021-01-19: qty 200

## 2021-01-19 NOTE — Progress Notes (Signed)
PROGRESS NOTE  Larry Cordova  DOB: 1930/05/26  PCP: [name], MD CZY:[id number]  DOA: 01/18/2021  LOS: 1 day   Chief Complaint  Patient presents with  . Weakness   Brief narrative: Larry Cordova is a 87 y.o. male with PMH significant for dementia, hypothyroidism was taking care at home by his family. Patient was brought to the ED on 2/9 for progressive physical and mental decline, low oral intake, dehydration, low blood pressure, decreased urine output, repeated falls.  Patient has history of dementia but was able to walk with a walker until recently.  Because of recent decline, family was in process of getting approved DMEs including hospital bed at home and did not want abdomen nursing home.  He had a nurse visit at home on 2/9 and was suggested to take him to ED.  In the ED, he was afebrile, blood pressure 109/84, breathing on room air Labs with sodium low at 133, BUN/creatinine elevated to 59/5.6. WBC count slightly elevated to 13, lactic acid was elevated to 3.8. Bladder scan noted 962 mL of urine.  Foley catheter inserted Covid PCR positive, without respiratory symptoms.  Patient was admitted to hospitalist service  Subjective: Patient was seen and examined this morning. Present elderly Caucasian male. Cheerful, demented. Confirmed his name for me. Otherwise unable to have a conversation Chart reviewed Remains hemodynamically stable  Assessment/Plan: Acute kidney injury -Baseline creatinine normal at 1.02 from a year ago. -Presented with a creatinine of 5.6.  Likely prerenal due to poor oral intake as well as postrenal due to urine retention. -Started on IV fluids, Foley catheter inserted. -Creatinine improving.  Continue to monitor Recent Labs    01/18/21 1640 01/19/21 0344  BUN 59* 44*  CREATININE 5.60* 3.03*   Acute urinary retention -More than 1000 mL drained with Foley catheter in the ED -May have underlying BPH.  -Renal ultrasound post  catheterization showed decompressed bladder did not show any evidence of hydronephrosis. -Started on Flomax daily. -Voiding trial in the next several days.  Lactic acidosis Hyponatremia -Probably secondary to poor oral intake, dehydration -Both improved with IV fluid. Recent Labs  Lab 01/18/21 1820 01/18/21 1937 01/19/21 0344  LATICACIDVEN 3.8* 2.7* 1.2   Recent Labs  Lab 01/18/21 1640 01/19/21 0344  NA 133* 138   COVID-19 virus infection -No respiratory symptoms.  Currently on room air at 100% -Mild leukocytosis.  Chest x-ray did not show any acute infiltrate. -I do not think there is any benefit of starting on remdesivir. -Airborne isolation for 10 days.  Elevated troponin -Mild elevation in troponin without cardiac symptoms or EKG changes. -No further ischemic evaluation at this time Recent Labs    01/18/21 1704 01/18/21 1937 01/19/21 0344  TROPONINIHS 31* 33* 27*   Hypothyroidism -Continue Synthroid.  Dementia with behavioral disturbance -Supportive care.  Mobility: PT eval Code Status:   Code Status: Full Code  Nutritional status: Body mass index is 21.88 kg/m.     Diet Order            Diet regular Room service appropriate? Yes; Fluid consistency: Thin  Diet effective now                 DVT prophylaxis: enoxaparin (LOVENOX) injection 30 mg Start: 01/18/21 2200   Antimicrobials:  None Fluid: Normal saline at 100 mill per hour Consultants: None Family Communication:  Bedside  Status is: Inpatient  Remains inpatient appropriate because: Needs further IV hydration  Dispo: The patient is from:  Home              Anticipated d/c is to: Home with DME versus SNF              Anticipated d/c date is: 3 days              Patient currently is not medically stable to d/c.   Difficult to place patient No       Infusions:  . sodium chloride 100 mL/hr at 01/19/21 0357    Scheduled Meds: . vitamin C  500 mg Oral Daily  . Chlorhexidine  Gluconate Cloth  6 each Topical Daily  . divalproex  500 mg Oral QHS  . enoxaparin (LOVENOX) injection  30 mg Subcutaneous Q24H  . levothyroxine  112 mcg Oral Q0600  . tamsulosin  0.4 mg Oral Daily  . zinc sulfate  220 mg Oral Daily    Antimicrobials: Anti-infectives (From admission, onward)   Start     Dose/Rate Route Frequency Ordered Stop   01/20/21 1000  remdesivir 100 mg in sodium chloride 0.9 % 100 mL IVPB  Status:  Discontinued       "Followed by" Linked Group Details   100 mg 200 mL/hr over 30 Minutes Intravenous Daily 01/19/21 0113 01/19/21 0122   01/20/21 1000  remdesivir 100 mg in sodium chloride 0.9 % 100 mL IVPB  Status:  Discontinued       "Followed by" Linked Group Details   100 mg 200 mL/hr over 30 Minutes Intravenous Daily 01/19/21 0122 01/19/21 0818   01/19/21 2000  remdesivir 200 mg in sodium chloride 0.9% 250 mL IVPB  Status:  Discontinued       "Followed by" Linked Group Details   200 mg 580 mL/hr over 30 Minutes Intravenous Once 01/19/21 0113 01/19/21 0122   01/19/21 0200  remdesivir 200 mg in sodium chloride 0.9% 250 mL IVPB       "Followed by" Linked Group Details   200 mg 580 mL/hr over 30 Minutes Intravenous Once 01/19/21 0122 01/19/21 0323      PRN meds: acetaminophen, albuterol, guaiFENesin-dextromethorphan, ondansetron **OR** ondansetron (ZOFRAN) IV, polyethylene glycol   Objective: Vitals:   01/19/21 0300 01/19/21 0648  BP: 103/65 115/69  Pulse: 69 68  Resp: 20 20  Temp: 97.8 F (36.6 C) 97.7 F (36.5 C)  SpO2: 94% 92%    Intake/Output Summary (Last 24 hours) at 01/19/2021 7096 Last data filed at 01/19/2021 0357 Gross per 24 hour  Intake 1707.79 ml  Output 1925 ml  Net -217.21 ml   Filed Weights   01/19/21 0250  Weight: 77.3 kg   Weight change:  Body mass index is 21.88 kg/m.   Physical Exam: General exam: Larry Cordova, elderly male. Not in physical distress. Demented. Looks dry Skin: No rashes, lesions or ulcers. HEENT:  Atraumatic, normocephalic, no obvious bleeding Lungs: Clear to auscultation bilaterally CVS: Regular rate and rhythm, no murmur GI/Abd soft, nontender, nondistended, bowel sound present CNS: Opens eyes on verbal command, able to confirm his name. Otherwise unable to have a conversation. Baseline dementia Psychiatry: Cheerful Extremities: No pedal edema, no calf tenderness  Data Review: I have personally reviewed the laboratory data and studies available.  Recent Labs  Lab 01/18/21 1640 01/19/21 0344  WBC 13.0* 7.9  NEUTROABS 11.1* 5.8  HGB 15.2 12.5*  HCT 47.4 37.7*  MCV 95.2 93.8  PLT 115* 87*   Recent Labs  Lab 01/18/21 1640 01/19/21 0344  NA 133* 138  K 4.6 3.9  CL 94* 103  CO2 20* 24  GLUCOSE 190* 75  BUN 59* 44*  CREATININE 5.60* 3.03*  CALCIUM 8.5* 7.6*  MG  --  2.1    F/u labs ordered  Signed, Terrilee Croak, MD [facility] 01/19/2021

## 2021-01-19 NOTE — ED Notes (Signed)
Pt temp 97.2 rectal. Per MD transition to warm blanks and remove bairhugger

## 2021-01-19 NOTE — Progress Notes (Signed)
PHARMACY - PHYSICIAN COMMUNICATION CRITICAL VALUE ALERT - BLOOD CULTURE IDENTIFICATION (BCID)  Larry Cordova is an 87 y.o. male with dementia who presented to [facility] on 01/18/2021 with a chief complaint of weakness, dehydration, found to have AKI and COVID+  Assessment:  3 of 4 blood cultures +staph epi, source currently unclear, therefore, will treat with abx  Name of physician (or Provider) Contacted: Lovey Newcomer NP  Current antibiotics: none  Changes to prescribed antibiotics recommended:  Recommendations accepted by provider  - Vancomycin 1500mg  IV x 1, SCr 3.03 improved from 5.6 yesterday - Follow up SCr in AM to determine maintenance dose - Check levels as needed  Results for orders placed or performed during the hospital encounter of 01/18/21  Blood Culture ID Panel (Reflexed) (Collected: 01/18/2021  6:20 PM)  Result Value Ref Range   Enterococcus faecalis NOT DETECTED NOT DETECTED   Enterococcus Faecium NOT DETECTED NOT DETECTED   Listeria monocytogenes NOT DETECTED NOT DETECTED   Staphylococcus species DETECTED (A) NOT DETECTED   Staphylococcus aureus (BCID) NOT DETECTED NOT DETECTED   Staphylococcus epidermidis DETECTED (A) NOT DETECTED   Staphylococcus lugdunensis NOT DETECTED NOT DETECTED   Streptococcus species NOT DETECTED NOT DETECTED   Streptococcus agalactiae NOT DETECTED NOT DETECTED   Streptococcus pneumoniae NOT DETECTED NOT DETECTED   Streptococcus pyogenes NOT DETECTED NOT DETECTED   A.calcoaceticus-baumannii NOT DETECTED NOT DETECTED   Bacteroides fragilis NOT DETECTED NOT DETECTED   Enterobacterales NOT DETECTED NOT DETECTED   Enterobacter cloacae complex NOT DETECTED NOT DETECTED   Escherichia coli NOT DETECTED NOT DETECTED   Klebsiella aerogenes NOT DETECTED NOT DETECTED   Klebsiella oxytoca NOT DETECTED NOT DETECTED   Klebsiella pneumoniae NOT DETECTED NOT DETECTED   Proteus species NOT DETECTED NOT DETECTED   Salmonella species NOT DETECTED NOT  DETECTED   Serratia marcescens NOT DETECTED NOT DETECTED   Haemophilus influenzae NOT DETECTED NOT DETECTED   Neisseria meningitidis NOT DETECTED NOT DETECTED   Pseudomonas aeruginosa NOT DETECTED NOT DETECTED   Stenotrophomonas maltophilia NOT DETECTED NOT DETECTED   Candida albicans NOT DETECTED NOT DETECTED   Candida auris NOT DETECTED NOT DETECTED   Candida glabrata NOT DETECTED NOT DETECTED   Candida krusei NOT DETECTED NOT DETECTED   Candida parapsilosis NOT DETECTED NOT DETECTED   Candida tropicalis NOT DETECTED NOT DETECTED   Cryptococcus neoformans/gattii NOT DETECTED NOT DETECTED   Methicillin resistance mecA/C DETECTED (A) NOT DETECTED    Peggyann Juba, PharmD, BCPS Pharmacy: [phone] 01/19/2021  7:58 PM

## 2021-01-19 NOTE — Progress Notes (Signed)
Initial Nutrition Assessment  RD working remotely.  DOCUMENTATION CODES:   Not applicable  INTERVENTION:  - will order Ensure Enlive BID, each supplement provides 350 kcal and 20 grams of protein. - will order 30 ml Prosource Plus once/day, each supplement provides 100 kcal and 15 grams protein.  - will order 1 tablet multivitamin with minerals/day. - completion NFPE when feasible.    NUTRITION DIAGNOSIS:   Increased nutrient needs related to acute illness,catabolic illness (PPJKD-32 infection) as evidenced by estimated needs.  GOAL:   Patient will meet greater than or equal to 90% of their needs  MONITOR:   PO intake,Supplement acceptance,Labs,Weight trends  REASON FOR ASSESSMENT:   Malnutrition Screening Tool  ASSESSMENT:   87 y.o. male with medical history of dementia and hypothyroidism. His family cares for him at home and brought him to the ED on 2/9 for progressive physical and mental decline, low oral intake, dehydration, low blood pressure, decreased urine output, and repeated falls. He had been able to walk with a walker until recently.  No intakes documented since admission. Patient noted to be a/o to self only.  Weight today is 170 lb and weight on 04/01/20 was 179 lb. This indicates 9 lb weight loss (5% body weight) in the past 10 months; not significant for time frame.  Per notes: - AKI - acute urinary retention  - lactic acidosis - COVID-19 infection - hx of dementia with behavioral disturbances   Labs reviewed; BUN: 44 mg/dl, creatinine: 3.03 mg/dl, Ca: 7.6 mg/dl, GFR: 19 ml/min.  Medications reviewed; 500 mg ascorbic acid/day, 112 mcg oral synthroid/day, 200 mg IV remdesivir x1 dose 2/10, 220 mg zinc sulfate/day.     NUTRITION - FOCUSED PHYSICAL EXAM:  unable to complete at this time.   Diet Order:   Diet Order            Diet regular Room service appropriate? Yes; Fluid consistency: Thin  Diet effective now                 EDUCATION  NEEDS:   Not appropriate for education at this time  Skin:  Skin Assessment: Reviewed RN Assessment  Last BM:  2/10 (type 6 x1 and type 7 x1)  Height:   Ht Readings from Last 1 Encounters:  04/01/20 6\' 2"  (1.88 m)    Weight:   Wt Readings from Last 1 Encounters:  01/19/21 77.3 kg    Estimated Nutritional Needs:  Kcal:  1800-2000 kcal Protein:  90-100 grams Fluid:  >/= 2.5 L/day      Jarome Matin, MS, RD, LDN, CNSC Inpatient Clinical Dietitian RD pager # available in [facility]  After hours/weekend pager # available in [facility]

## 2021-01-20 ENCOUNTER — Inpatient Hospital Stay (HOSPITAL_COMMUNITY): Payer: Medicare Other

## 2021-01-20 DIAGNOSIS — R7881 Bacteremia: Secondary | ICD-10-CM | POA: Diagnosis not present

## 2021-01-20 DIAGNOSIS — N179 Acute kidney failure, unspecified: Secondary | ICD-10-CM | POA: Diagnosis not present

## 2021-01-20 LAB — CBC WITH DIFFERENTIAL/PLATELET
Abs Immature Granulocytes: 0.03 10*3/uL (ref 0.00–0.07)
Basophils Absolute: 0 10*3/uL (ref 0.0–0.1)
Basophils Relative: 0 %
Eosinophils Absolute: 0 10*3/uL (ref 0.0–0.5)
Eosinophils Relative: 0 %
HCT: 37.2 % — ABNORMAL LOW (ref 39.0–52.0)
Hemoglobin: 12.4 g/dL — ABNORMAL LOW (ref 13.0–17.0)
Immature Granulocytes: 0 %
Lymphocytes Relative: 12 %
Lymphs Abs: 1.1 10*3/uL (ref 0.7–4.0)
MCH: 30.7 pg (ref 26.0–34.0)
MCHC: 33.3 g/dL (ref 30.0–36.0)
MCV: 92.1 fL (ref 80.0–100.0)
Monocytes Absolute: 0.7 10*3/uL (ref 0.1–1.0)
Monocytes Relative: 9 %
Neutro Abs: 6.8 10*3/uL (ref 1.7–7.7)
Neutrophils Relative %: 79 %
Platelets: 105 10*3/uL — ABNORMAL LOW (ref 150–400)
RBC: 4.04 MIL/uL — ABNORMAL LOW (ref 4.22–5.81)
RDW: 13.2 % (ref 11.5–15.5)
WBC: 8.6 10*3/uL (ref 4.0–10.5)
nRBC: 0 % (ref 0.0–0.2)

## 2021-01-20 LAB — COMPREHENSIVE METABOLIC PANEL
ALT: 21 U/L (ref 0–44)
AST: 45 U/L — ABNORMAL HIGH (ref 15–41)
Albumin: 2.2 g/dL — ABNORMAL LOW (ref 3.5–5.0)
Alkaline Phosphatase: 39 U/L (ref 38–126)
Anion gap: 12 (ref 5–15)
BUN: 21 mg/dL (ref 8–23)
CO2: 19 mmol/L — ABNORMAL LOW (ref 22–32)
Calcium: 8 mg/dL — ABNORMAL LOW (ref 8.9–10.3)
Chloride: 117 mmol/L — ABNORMAL HIGH (ref 98–111)
Creatinine, Ser: 0.86 mg/dL (ref 0.61–1.24)
GFR, Estimated: >60 mL/min (ref >60)
Glucose, Bld: 107 mg/dL — ABNORMAL HIGH (ref 70–99)
Potassium: 3.6 mmol/L (ref 3.5–5.1)
Sodium: 148 mmol/L — ABNORMAL HIGH (ref 135–145)
Total Bilirubin: 0.7 mg/dL (ref 0.3–1.2)
Total Protein: 5.1 g/dL — ABNORMAL LOW (ref 6.5–8.1)

## 2021-01-20 LAB — PROCALCITONIN: Procalcitonin: 1.32 ng/mL

## 2021-01-20 LAB — ECHOCARDIOGRAM COMPLETE
Area-P 1/2: 2.17 cm2
P 1/2 time: 405 msec
S' Lateral: 2.63 cm
Weight: 2726.65 oz

## 2021-01-20 LAB — MAGNESIUM: Magnesium: 1.7 mg/dL (ref 1.7–2.4)

## 2021-01-20 LAB — URINE CULTURE: Culture: NO GROWTH

## 2021-01-20 LAB — UREA NITROGEN, URINE: Urea Nitrogen, Ur: 602 mg/dL

## 2021-01-20 MED ORDER — VANCOMYCIN HCL 1500 MG/300ML IV SOLN
1500.0000 mg | INTRAVENOUS | Status: DC
  Administered 2021-01-20 – 2021-01-22 (×3): 1500 mg via INTRAVENOUS
  Filled 2021-01-20 (×4): qty 300

## 2021-01-20 MED ORDER — LOPERAMIDE HCL 2 MG PO CAPS
2.0000 mg | ORAL_CAPSULE | Freq: Once | ORAL | Status: AC
  Administered 2021-01-20: 2 mg via ORAL
  Filled 2021-01-20: qty 1

## 2021-01-20 MED ORDER — ENOXAPARIN SODIUM 40 MG/0.4ML ~~LOC~~ SOLN
40.0000 mg | SUBCUTANEOUS | Status: DC
  Administered 2021-01-20 – 2021-01-22 (×3): 40 mg via SUBCUTANEOUS
  Filled 2021-01-20 (×3): qty 0.4

## 2021-01-20 MED ORDER — DIVALPROEX SODIUM 125 MG PO CSDR
500.0000 mg | DELAYED_RELEASE_CAPSULE | Freq: Every day | ORAL | Status: DC
  Administered 2021-01-20 – 2021-01-22 (×3): 500 mg via ORAL
  Filled 2021-01-20 (×3): qty 4

## 2021-01-20 MED ORDER — DEXTROSE 5 % IV SOLN: INTRAVENOUS | Status: DC

## 2021-01-20 MED ORDER — LIP MEDEX EX OINT
TOPICAL_OINTMENT | CUTANEOUS | Status: DC | PRN
  Filled 2021-01-20: qty 7

## 2021-01-20 NOTE — Care Management Important Message (Signed)
Medicare IM printed for Rodney North to give to the patient. 

## 2021-01-20 NOTE — Evaluation (Signed)
Physical Therapy Evaluation Patient Details Name: Larry Cordova MRN: [id number] DOB: 05/08/1930 Today's Date: 01/20/2021   History of Present Illness  87 y.o. male with PMH significant for dementia, hypothyroidism was taking care at home by his family.  Patient was brought to the ED on 2/9 for progressive physical and mental decline, low oral intake, dehydration, low blood pressure, decreased urine output, repeated falls.  Patient has history of dementia but was able to walk with a walker until recently.  Because of recent decline, family was in process of getting approved DMEs including hospital bed at home and did not want abdomen nursing home.  He had a nurse visit at home on 2/9 and was suggested to take him to ED. Pt admitted for AKI, Acute urinary retention, and covid 19 virus infection.  Clinical Impression  Pt admitted with above diagnosis.  Pt currently with functional limitations due to the deficits listed below (see PT Problem List). Pt will benefit from skilled PT to increase their independence and safety with mobility to allow discharge to the venue listed below.  Pt not able to follow commands and restless/resistant to assist with bed mobility.  Pt poor historian however per chart review, pt from home with family assist and has recently had decline in mobility.  Pt may need SNF upon d/c if family is unable to provide current level of care.     Follow Up Recommendations SNF    Equipment Recommendations  Wheelchair cushion (measurements PT);Wheelchair (measurements PT);Hospital bed (if home, pt appears total care, will need all DME)    Recommendations for Other Services       Precautions / Restrictions Precautions Precautions: Fall Precaution Comments: mittens      Mobility  Bed Mobility Overal bed mobility: Needs Assistance Bed Mobility: Rolling Rolling: Total assist;+2 for physical assistance         General bed mobility comments: pt with BM on arrival so nurse tech  into room to assist with pericare, pt requiring total assist for hygiene and resistant/restless to rolling and hygiene, attempting to pull on foley catheter at times even with mittens in place; did not attempt to further mobilize due to cognition and pt not able to follow commands    Transfers                    Ambulation/Gait                Stairs            Wheelchair Mobility    Modified Rankin (Stroke Patients Only)       Balance Overall balance assessment: History of Falls                                           Pertinent Vitals/Pain Pain Assessment: Faces Faces Pain Scale: Hurts a little bit Pain Location: periarea with pericare Pain Descriptors / Indicators: Restless Pain Intervention(s): Repositioned;Monitored during session    Home Living Family/patient expects to be discharged to:: Private residence Living Arrangements: Spouse/significant other Available Help at Discharge: Family Type of Home: House           Additional Comments: per chart, pt from home, family looking for more DME as pt has had decline in mobility    Prior Function Level of Independence: Needs assistance   Gait / Transfers Assistance Needed: per notes: requiring more  assist lately, was ambulatory with RW until recently           Waddle Dominance        Extremity/Trunk Assessment        Lower Extremity Assessment Lower Extremity Assessment: Generalized weakness (decreased muscle mass throughout)       Communication      Cognition Arousal/Alertness: Awake/alert Behavior During Therapy: Restless;Agitated Overall Cognitive Status: History of cognitive impairments - at baseline                                 General Comments: hx dementia, pt responds to his name, not verbalizing, restless and agitated with bed mobility/pericare      General Comments      Exercises     Assessment/Plan    PT Assessment Patient  needs continued PT services  PT Problem List Decreased strength;Decreased activity tolerance;Decreased balance;Decreased knowledge of use of DME;Decreased mobility;Decreased cognition       PT Treatment Interventions DME instruction;Gait training;Therapeutic exercise;Wheelchair mobility training;Functional mobility training;Therapeutic activities;Patient/family education    PT Goals (Current goals can be found in the Care Plan section)  Acute Rehab PT Goals PT Goal Formulation: Patient unable to participate in goal setting Time For Goal Achievement: 02/03/21 Potential to Achieve Goals: Fair    Frequency Min 2X/week   Barriers to discharge        Co-evaluation               AM-PAC PT "6 Clicks" Mobility  Outcome Measure Help needed turning from your back to your side while in a flat bed without using bedrails?: Total Help needed moving from lying on your back to sitting on the side of a flat bed without using bedrails?: Total Help needed moving to and from a bed to a chair (including a wheelchair)?: Total Help needed standing up from a chair using your arms (e.g., wheelchair or bedside chair)?: Total Help needed to walk in hospital room?: Total Help needed climbing 3-5 steps with a railing? : Total 6 Click Score: 6    End of Session   Activity Tolerance: Other (comment) (limited by cognition) Patient left: in bed;with call bell/phone within reach;with bed alarm set Nurse Communication: Mobility status PT Visit Diagnosis: Adult, failure to thrive (R62.7)    Time: 2010-0712 PT Time Calculation (min) (ACUTE ONLY): 19 min   Charges:   PT Evaluation $PT Eval Low Complexity: 1 Low        Kati PT, DPT Acute Rehabilitation Services Pager: [phone] Office: [phone]  York Ram E 01/20/2021, 4:29 PM

## 2021-01-20 NOTE — Plan of Care (Signed)

## 2021-01-20 NOTE — Plan of Care (Signed)
  Problem: Education: Goal: Knowledge of General Education information will improve Description: Including pain rating scale, medication(s)/side effects and non-pharmacologic comfort measures Outcome: Progressing   Problem: Safety: Goal: Ability to remain free from injury will improve Outcome: Progressing   Problem: Education: Goal: Knowledge of risk factors and measures for prevention of condition will improve Outcome: Progressing   Problem: Respiratory: Goal: Will maintain a patent airway Outcome: Progressing

## 2021-01-20 NOTE — Telephone Encounter (Signed)
I understand and did see that he is in the hospital.  They can certainly discuss hospital bed or other resources with social work or care providers in the hospital.  Does not need to see me if they are able to address that concern.  Thanks.

## 2021-01-20 NOTE — Progress Notes (Signed)
PROGRESS NOTE  Larry Cordova  DOB: 10-Mar-1930  PCP: [name], MD VOH:[id number]  DOA: 01/18/2021  LOS: 2 days   Chief Complaint  Patient presents with  . Weakness   Brief narrative: Larry Cordova is a 87 y.o. male with PMH significant for dementia, hypothyroidism was taking care at home by his family. Patient was brought to the ED on 2/9 for progressive physical and mental decline, low oral intake, dehydration, low blood pressure, decreased urine output, repeated falls.  Patient has history of dementia but was able to walk with a walker until recently.  Because of recent decline, family was in process of getting approved DMEs including hospital bed at home and did not want abdomen nursing home.  He had a nurse visit at home on 2/9 and was suggested to take him to ED.  In the ED, he was afebrile, blood pressure 109/84, breathing on room air Labs with sodium low at 133, BUN/creatinine elevated to 59/5.6. WBC count slightly elevated to 13, lactic acid was elevated to 3.8. Bladder scan noted 962 mL of urine.  Foley catheter inserted Covid PCR positive, without respiratory symptoms.  Patient was admitted to hospitalist service.  Subjective: Patient was seen and examined this morning.  Cheerful elderly demented male, alert, awake.  Daughter at bedside.  Even from last night noted.  Patient was restless agitated.  He was controlled with Depakote, Ativan.  Family state at bedside all night long.   Remains hemodynamically stable.  Assessment/Plan: Acute kidney injury -Baseline creatinine normal at 1.02 from a year ago. -Presented with a creatinine of 5.6.  Likely prerenal due to poor oral intake as well as postrenal due to urine retention. -Started on IV fluids, Foley catheter inserted. -Creatinine significantly improved down to normal.  Stop IV fluid today Recent Labs    01/18/21 1640 01/19/21 0344 01/20/21 0341  BUN 59* 44* 21  CREATININE 5.60* 3.03* 0.86   Acute  urinary retention -More than 1000 mL drained with Foley catheter in the ED -May have underlying BPH.  -Renal ultrasound post catheterization showed decompressed bladder did not show any evidence of hydronephrosis. -Started on Flomax daily. -Voiding trial in the next several days.  Hyponatremia/hyponatremia  -Sodium level was low at 130 at presentation.  With normal saline, sodium level is up 248 today.  Stop normal saline.  Continue to monitor.  If no improvement, will start on dextrose solution tomorrow. Recent Labs  Lab 01/18/21 1640 01/19/21 0344 01/20/21 0341  NA 133* 138 148*   Lactic acidosis -Probably secondary to poor oral intake, dehydration -Improved with hydration. Recent Labs  Lab 01/18/21 1820 01/18/21 1937 01/19/21 0344  LATICACIDVEN 3.8* 2.7* 1.2   COVID-19 virus infection -No respiratory symptoms.  Currently on room air at 100% -Mild leukocytosis.  Chest x-ray did not show any acute infiltrate. -Not on any treatment for Covid. -Airborne isolation for 10 days.  Staph epidermidis bacteremia -3 out of 4 blood culture obtained on admission is positive for staph epidermidis.  High likely to be true infection.  Unclear source. -Started on IV vancomycin. -Obtain repeat blood culture. -Obtain TTE.  Elevated troponin -Mild elevation in troponin without cardiac symptoms or EKG changes. -No further ischemic evaluation at this time Recent Labs    01/18/21 1704 01/18/21 1937 01/19/21 0344  TROPONINIHS 31* 33* 27*   Hypothyroidism -Continue Synthroid.  Dementia with behavioral disturbance -Supportive care.  Mobility: PT eval pending.  Patient's family is considering to take him home with services.  Code Status:   Code Status: Full Code  Nutritional status: Body mass index is 21.88 kg/m. Nutrition Problem: Increased nutrient needs Etiology: acute illness,catabolic illness (KDTOI-71 infection) Signs/Symptoms: estimated needs Diet Order            DIET  SOFT Room service appropriate? Yes; Fluid consistency: Thin  Diet effective now                 DVT prophylaxis: enoxaparin (LOVENOX) injection 40 mg Start: 01/20/21 2200   Antimicrobials:  IV vancomycin Fluid: Stop IV fluid Consultants: None Family Communication:  Bedside  Status is: Inpatient  Remains inpatient appropriate because: Needs further IV hydration  Dispo: The patient is from: Home              Anticipated d/c is to: Home with DME versus SNF              Anticipated d/c date is: 3 days              Patient currently is not medically stable to d/c.   Difficult to place patient No   Infusions:  . vancomycin      Scheduled Meds: . (feeding supplement) PROSource Plus  30 mL Oral Daily  . vitamin C  500 mg Oral Daily  . Chlorhexidine Gluconate Cloth  6 each Topical Daily  . divalproex  500 mg Oral QHS  . enoxaparin (LOVENOX) injection  40 mg Subcutaneous Q24H  . feeding supplement  237 mL Oral BID BM  . levothyroxine  112 mcg Oral Q0600  . multivitamin with minerals  1 tablet Oral Daily  . tamsulosin  0.4 mg Oral Daily  . zinc sulfate  220 mg Oral Daily    Antimicrobials: Anti-infectives (From admission, onward)   Start     Dose/Rate Route Frequency Ordered Stop   01/20/21 2200  vancomycin (VANCOREADY) IVPB 1500 mg/300 mL        1,500 mg 150 mL/hr over 120 Minutes Intravenous Every 24 hours 01/20/21 1023     01/20/21 1000  remdesivir 100 mg in sodium chloride 0.9 % 100 mL IVPB  Status:  Discontinued       "Followed by" Linked Group Details   100 mg 200 mL/hr over 30 Minutes Intravenous Daily 01/19/21 0113 01/19/21 0122   01/20/21 1000  remdesivir 100 mg in sodium chloride 0.9 % 100 mL IVPB  Status:  Discontinued       "Followed by" Linked Group Details   100 mg 200 mL/hr over 30 Minutes Intravenous Daily 01/19/21 0122 01/19/21 0818   01/19/21 2030  vancomycin (VANCOREADY) IVPB 1500 mg/300 mL        1,500 mg 150 mL/hr over 120 Minutes Intravenous  Once  01/19/21 1952 01/19/21 2328   01/19/21 2000  remdesivir 200 mg in sodium chloride 0.9% 250 mL IVPB  Status:  Discontinued       "Followed by" Linked Group Details   200 mg 580 mL/hr over 30 Minutes Intravenous Once 01/19/21 0113 01/19/21 0122   01/19/21 1956  vancomycin variable dose per unstable renal function (pharmacist dosing)  Status:  Discontinued         Does not apply See admin instructions 01/19/21 1957 01/20/21 1023   01/19/21 0200  remdesivir 200 mg in sodium chloride 0.9% 250 mL IVPB       "Followed by" Linked Group Details   200 mg 580 mL/hr over 30 Minutes Intravenous Once 01/19/21 0122 01/19/21 0323      PRN  meds: acetaminophen, albuterol, guaiFENesin-dextromethorphan, lip balm, ondansetron **OR** ondansetron (ZOFRAN) IV, polyethylene glycol   Objective: Vitals:   01/19/21 2037 01/20/21 0501  BP: 103/87 135/82  Pulse: 78 94  Resp: 20 17  Temp: 98.2 F (36.8 C) 99.3 F (37.4 C)  SpO2: 97%     Intake/Output Summary (Last 24 hours) at 01/20/2021 1116 Last data filed at 01/20/2021 0500 Gross per 24 hour  Intake 2139.71 ml  Output 1351 ml  Net 788.71 ml   Filed Weights   01/19/21 0250  Weight: 77.3 kg   Weight change:  Body mass index is 21.88 kg/m.   Physical Exam: General exam: Sophia, elderly male. Not in physical distress.  Demented. Skin: No rashes, lesions or ulcers. HEENT: Atraumatic, normocephalic, no obvious bleeding Lungs: Mild Rales on both bases.  Not in respite distress.  Not requiring supplemental oxygen CVS: Regular rate and rhythm, no murmur GI/Abd soft, nontender, nondistended, bowel sound present CNS: Opens eyes on verbal command, able to confirm his name. Otherwise unable to have a conversation. Baseline dementia Psychiatry: Cheerful Extremities: No pedal edema, no calf tenderness  Data Review: I have personally reviewed the laboratory data and studies available.  Recent Labs  Lab 01/18/21 1640 01/19/21 0344 01/20/21 0341  WBC  13.0* 7.9 8.6  NEUTROABS 11.1* 5.8 6.8  HGB 15.2 12.5* 12.4*  HCT 47.4 37.7* 37.2*  MCV 95.2 93.8 92.1  PLT 115* 87* 105*   Recent Labs  Lab 01/18/21 1640 01/19/21 0344 01/20/21 0341  NA 133* 138 148*  K 4.6 3.9 3.6  CL 94* 103 117*  CO2 20* 24 19*  GLUCOSE 190* 75 107*  BUN 59* 44* 21  CREATININE 5.60* 3.03* 0.86  CALCIUM 8.5* 7.6* 8.0*  MG  --  2.1 1.7    F/u labs ordered  Signed, Terrilee Croak, MD [facility] 01/20/2021

## 2021-01-20 NOTE — Progress Notes (Signed)
  Echocardiogram 2D Echocardiogram has been performed.  Larry Cordova Gadiel John 01/20/2021, 1:53 PM

## 2021-01-20 NOTE — Telephone Encounter (Signed)
Follow up from wife states cannot do in office visit please advise next action

## 2021-01-20 NOTE — Telephone Encounter (Signed)
Pt's wife is calling checking on status of the message. Pt is in [facility] at this time. Please advise pt. She says he can only come to [place] by ambulance. He can barely walk now. [phone]

## 2021-01-20 NOTE — Progress Notes (Signed)
Patient is agitated, anxious and restless. Pulled IV out of his arm. Spoke with patients daughter, Larry Cordova and she expressed that the patient is usually around family at all times. Spoke with Larry Cordova, CN to okay compassionate visit. Patient given scheduled depakote sprinkles and ativan 1mg , it was effective and his daughter Larry Cordova remain at the bedside. Will continue to monitor the patient.

## 2021-01-20 NOTE — Progress Notes (Signed)
Pharmacy Antibiotic Note  Larry Cordova is an 87 y.o. male with hx dementia presented to the ED on 01/18/2021 with generalized weakness and was found to have AKI. COVID PCR came back positive.  Blood culture collected on 2/9 grew staph epidermidis (mech A/C+).  He's currently on vancomycin for bacteremia.  Today, 01/20/2021: - day #1 abx - Tmax 99.3, wbc wnl - scr improved to 0.86 today with hydration   Plan: - change vancomycin regimen to 1500 mg IV q24h for est AUC 466 - f/u with renal function, culture and clinical status _____________________________________  Weight: 77.3 kg (170 lb 6.7 oz)  Temp (24hrs), Avg:98.7 F (37.1 C), Min:98.2 F (36.8 C), Max:99.3 F (37.4 C)  Recent Labs  Lab 01/18/21 1640 01/18/21 1820 01/18/21 1937 01/19/21 0344 01/20/21 0341  WBC 13.0*  --   --  7.9 8.6  CREATININE 5.60*  --   --  3.03* 0.86  LATICACIDVEN  --  3.8* 2.7* 1.2  --     CrCl cannot be calculated (Unknown ideal weight.).    No Known Allergies   Thank you for allowing pharmacy to be a part of this patient's care.  Lynelle Doctor 01/20/2021 10:11 AM

## 2021-01-21 DIAGNOSIS — N179 Acute kidney failure, unspecified: Secondary | ICD-10-CM | POA: Diagnosis not present

## 2021-01-21 LAB — CBC WITH DIFFERENTIAL/PLATELET
Abs Immature Granulocytes: 0.04 10*3/uL (ref 0.00–0.07)
Basophils Absolute: 0 10*3/uL (ref 0.0–0.1)
Basophils Relative: 0 %
Eosinophils Absolute: 0.2 10*3/uL (ref 0.0–0.5)
Eosinophils Relative: 2 %
HCT: 35 % — ABNORMAL LOW (ref 39.0–52.0)
Hemoglobin: 11.1 g/dL — ABNORMAL LOW (ref 13.0–17.0)
Immature Granulocytes: 0 %
Lymphocytes Relative: 17 %
Lymphs Abs: 1.5 10*3/uL (ref 0.7–4.0)
MCH: 31 pg (ref 26.0–34.0)
MCHC: 31.7 g/dL (ref 30.0–36.0)
MCV: 97.8 fL (ref 80.0–100.0)
Monocytes Absolute: 0.9 10*3/uL (ref 0.1–1.0)
Monocytes Relative: 10 %
Neutro Abs: 6.3 10*3/uL (ref 1.7–7.7)
Neutrophils Relative %: 71 %
Platelets: 124 10*3/uL — ABNORMAL LOW (ref 150–400)
RBC: 3.58 MIL/uL — ABNORMAL LOW (ref 4.22–5.81)
RDW: 13.3 % (ref 11.5–15.5)
WBC: 8.9 10*3/uL (ref 4.0–10.5)
nRBC: 0 % (ref 0.0–0.2)

## 2021-01-21 LAB — COMPREHENSIVE METABOLIC PANEL
ALT: 19 U/L (ref 0–44)
AST: 43 U/L — ABNORMAL HIGH (ref 15–41)
Albumin: 2 g/dL — ABNORMAL LOW (ref 3.5–5.0)
Alkaline Phosphatase: 39 U/L (ref 38–126)
Anion gap: 9 (ref 5–15)
BUN: 16 mg/dL (ref 8–23)
CO2: 24 mmol/L (ref 22–32)
Calcium: 7.7 mg/dL — ABNORMAL LOW (ref 8.9–10.3)
Chloride: 109 mmol/L (ref 98–111)
Creatinine, Ser: 0.74 mg/dL (ref 0.61–1.24)
GFR, Estimated: >60 mL/min (ref >60)
Glucose, Bld: 132 mg/dL — ABNORMAL HIGH (ref 70–99)
Potassium: 3.7 mmol/L (ref 3.5–5.1)
Sodium: 142 mmol/L (ref 135–145)
Total Bilirubin: 0.8 mg/dL (ref 0.3–1.2)
Total Protein: 4.5 g/dL — ABNORMAL LOW (ref 6.5–8.1)

## 2021-01-21 LAB — MAGNESIUM: Magnesium: 1.8 mg/dL (ref 1.7–2.4)

## 2021-01-21 MED ORDER — SODIUM CHLORIDE 0.9 % IV SOLN: INTRAVENOUS | Status: DC

## 2021-01-21 NOTE — Plan of Care (Signed)
  Problem: Education: Goal: Knowledge of General Education information will improve Description: Including pain rating scale, medication(s)/side effects and non-pharmacologic comfort measures Outcome: Progressing   Problem: Nutrition: Goal: Adequate nutrition will be maintained Outcome: Progressing   Problem: Respiratory: Goal: Will maintain a patent airway Outcome: Progressing

## 2021-01-21 NOTE — Progress Notes (Signed)
PROGRESS NOTE  Larry Cordova  DOB: [birth year]  PCP: [name], MD NWG:[id number]  DOA: 01/18/2021  LOS: 3 days   Chief Complaint  Patient presents with  . Weakness   Brief narrative: Larry Cordova is a 87 y.o. male with PMH significant for dementia, hypothyroidism was taking care at home by his family. Patient was brought to the ED on 2/9 for progressive physical and mental decline, low oral intake, dehydration, low blood pressure, decreased urine output, repeated falls.  Patient has history of dementia but was able to walk with a walker until recently.  Because of recent decline, family was in process of getting approved DMEs including hospital bed at home and did not want abdomen nursing home.  He had a nurse visit at home on 2/9 and was suggested to take him to ED.  In the ED, he was afebrile, blood pressure 109/84, breathing on room air Labs with sodium low at 133, BUN/creatinine elevated to 59/5.6. WBC count slightly elevated to 13, lactic acid was elevated to 3.8. Bladder scan noted 962 mL of urine.  Foley catheter inserted Covid PCR positive, without respiratory symptoms.  Patient was admitted to hospitalist service. See below for details.  Subjective: Patient was seen and examined this morning.  Larry Cordova elderly male with dementia, sleeping, wakes up on touch.  Cheerful.  Not in distress.  Had some agitation episodes last night but controlled.  Spoke to daughter at bedside. Apparently, last night patient had some clots in his urine.  Night nurse remove the Foley catheter and could not put another one. Coud catheter was placed this morning.  Urine is dark, concentrated urine bag.  Assessment/Plan: Acute kidney injury -Baseline creatinine normal at 1.02 from a year ago. -Presented with a creatinine of 5.6.  Likely prerenal due to poor oral intake as well as postrenal due to urine retention. -With Foley catheter insertion and IV fluid, creatinine improved. -He  has poor oral intake and hence making concentrated urine output we will start the patient on normal saline at 50 mill per hour Recent Labs    01/18/21 1640 01/19/21 0344 01/20/21 0341 01/21/21 0117  BUN 59* 44* 21 16  CREATININE 5.60* 3.03* 0.86 0.74   Acute urinary retention -More than 1000 mL drained with Foley catheter in the ED -May have underlying BPH.  -Renal ultrasound post catheterization showed decompressed bladder did not show any evidence of hydronephrosis. -Started on Flomax daily. -Apparently, last night patient had some clots in his urine.  Night nurse remove the Foley catheter and could not put another one. Coud catheter was placed this morning.  Urine is dark, concentrated urine bag.  Hyponatremia/hyponatremia  -Sodium level currently normal range.  Continue to monitor. Recent Labs  Lab 01/18/21 1640 01/19/21 0344 01/20/21 0341 01/21/21 0117  NA 133* 138 148* 142   Lactic acidosis -Probably secondary to poor oral intake, dehydration -Improved with hydration. Recent Labs  Lab 01/18/21 1820 01/18/21 1937 01/19/21 0344  LATICACIDVEN 3.8* 2.7* 1.2   COVID-19 virus infection -No respiratory symptoms.  Currently on room air at 100% -Mild leukocytosis.  Chest x-ray did not show any acute infiltrate. -Not on any treatment for Covid. -Airborne isolation for 10 days..  Tested positive on 2/9.  Staph epidermidis bacteremia -3 out of 4 blood culture obtained on admission is positive for staph epidermidis.  High likely to be true infection.  Unclear source. -Started on IV vancomycin. -Repeat blood culture report pending. -Echocardiogram showed EF of 65 to  70% with grade 1 diastolic dysfunction, no evidence of vegetation.  Elevated troponin -Mild elevation in troponin without cardiac symptoms or EKG changes. -No further ischemic evaluation at this time Recent Labs    01/18/21 1704 01/18/21 1937 01/19/21 0344  TROPONINIHS 31* 33* 27*    Hypothyroidism -Continue Synthroid.  Dementia with behavioral disturbance -Supportive care.  Mobility: PT eval obtained.  SNF was recommended.  However patient's family is considering to take him home with services.  I ordered for hospital bed, wheelchair and cushion as advised. Code Status:   Code Status: Full Code  Nutritional status: Body mass index is 21.88 kg/m. Nutrition Problem: Increased nutrient needs Etiology: acute illness,catabolic illness (JOACZ-66 infection) Signs/Symptoms: estimated needs Diet Order            DIET SOFT Room service appropriate? Yes; Fluid consistency: Thin  Diet effective now                 DVT prophylaxis: enoxaparin (LOVENOX) injection 40 mg Start: 01/20/21 2200   Antimicrobials:  IV vancomycin Fluid: Normal saline at 50 mill per hour Consultants: None Family Communication:  Bedside  Status is: Inpatient  Remains inpatient appropriate because: Needs further IV hydration, pending final blood culture report  Dispo: The patient is from: Home              Anticipated d/c is to: Home with services, DME versus SNF              Anticipated d/c date is: 3 days              Patient currently is not medically stable to d/c.   Difficult to place patient No   Infusions:  . sodium chloride    . vancomycin 150 mL/hr at 01/20/21 2209    Scheduled Meds: . (feeding supplement) PROSource Plus  30 mL Oral Daily  . vitamin C  500 mg Oral Daily  . Chlorhexidine Gluconate Cloth  6 each Topical Daily  . divalproex  500 mg Oral QHS  . enoxaparin (LOVENOX) injection  40 mg Subcutaneous Q24H  . feeding supplement  237 mL Oral BID BM  . levothyroxine  112 mcg Oral Q0600  . multivitamin with minerals  1 tablet Oral Daily  . tamsulosin  0.4 mg Oral Daily  . zinc sulfate  220 mg Oral Daily    Antimicrobials: Anti-infectives (From admission, onward)   Start     Dose/Rate Route Frequency Ordered Stop   01/20/21 2200  vancomycin (VANCOREADY) IVPB  1500 mg/300 mL        1,500 mg 150 mL/hr over 120 Minutes Intravenous Every 24 hours 01/20/21 1023     01/20/21 1000  remdesivir 100 mg in sodium chloride 0.9 % 100 mL IVPB  Status:  Discontinued       "Followed by" Linked Group Details   100 mg 200 mL/hr over 30 Minutes Intravenous Daily 01/19/21 0113 01/19/21 0122   01/20/21 1000  remdesivir 100 mg in sodium chloride 0.9 % 100 mL IVPB  Status:  Discontinued       "Followed by" Linked Group Details   100 mg 200 mL/hr over 30 Minutes Intravenous Daily 01/19/21 0122 01/19/21 0818   01/19/21 2030  vancomycin (VANCOREADY) IVPB 1500 mg/300 mL        1,500 mg 150 mL/hr over 120 Minutes Intravenous  Once 01/19/21 1952 01/19/21 2328   01/19/21 2000  remdesivir 200 mg in sodium chloride 0.9% 250 mL IVPB  Status:  Discontinued       "  Followed by" Linked Group Details   200 mg 580 mL/hr over 30 Minutes Intravenous Once 01/19/21 0113 01/19/21 0122   01/19/21 1956  vancomycin variable dose per unstable renal function (pharmacist dosing)  Status:  Discontinued         Does not apply See admin instructions 01/19/21 1957 01/20/21 1023   01/19/21 0200  remdesivir 200 mg in sodium chloride 0.9% 250 mL IVPB       "Followed by" Linked Group Details   200 mg 580 mL/hr over 30 Minutes Intravenous Once 01/19/21 0122 01/19/21 0323      PRN meds: acetaminophen, albuterol, guaiFENesin-dextromethorphan, lip balm, ondansetron **OR** ondansetron (ZOFRAN) IV, polyethylene glycol   Objective: Vitals:   01/20/21 2104 01/21/21 0501  BP: 127/77 129/80  Pulse: 69 79  Resp: 20 18  Temp: (!) 97.5 F (36.4 C) 97.9 F (36.6 C)  SpO2: 97% 100%    Intake/Output Summary (Last 24 hours) at 01/21/2021 1048 Last data filed at 01/21/2021 0400 Gross per 24 hour  Intake 250 ml  Output 800 ml  Net -550 ml   Filed Weights   01/19/21 0250  Weight: 77.3 kg   Weight change:  Body mass index is 21.88 kg/m.   Physical Exam: General exam: Blease, elderly male.  Not in physical distress.  Cheerfully demented. Skin: No rashes, lesions or ulcers. HEENT: Atraumatic, normocephalic, no obvious bleeding Lungs: Mild Rales on both bases.  Not in respite distress.  Not requiring supplemental oxygen CVS: Regular rate and rhythm, no murmur GI/Abd soft, nontender, nondistended, bowel sound present CNS: Sleeping, wakes up on touch.  Not restless or agitated.  Cheerfully demented.   Psychiatry: Cheerful Extremities: No pedal edema, no calf tenderness  Data Review: I have personally reviewed the laboratory data and studies available.  Recent Labs  Lab 01/18/21 1640 01/19/21 0344 01/20/21 0341 01/21/21 0117  WBC 13.0* 7.9 8.6 8.9  NEUTROABS 11.1* 5.8 6.8 6.3  HGB 15.2 12.5* 12.4* 11.1*  HCT 47.4 37.7* 37.2* 35.0*  MCV 95.2 93.8 92.1 97.8  PLT 115* 87* 105* 124*   Recent Labs  Lab 01/18/21 1640 01/19/21 0344 01/20/21 0341 01/21/21 0117  NA 133* 138 148* 142  K 4.6 3.9 3.6 3.7  CL 94* 103 117* 109  CO2 20* 24 19* 24  GLUCOSE 190* 75 107* 132*  BUN 59* 44* 21 16  CREATININE 5.60* 3.03* 0.86 0.74  CALCIUM 8.5* 7.6* 8.0* 7.7*  MG  --  2.1 1.7 1.8    F/u labs ordered  Signed, Terrilee Croak, MD [facility] 01/21/2021

## 2021-01-21 NOTE — Progress Notes (Signed)
Foley catheter not draining on call Jeannette Corpus NP notified, bladder irrigation and foley reinsertion  Unsuccessful, new order of foley coude  place by urology nurse.

## 2021-01-22 ENCOUNTER — Other Ambulatory Visit: Payer: Self-pay

## 2021-01-22 DIAGNOSIS — N179 Acute kidney failure, unspecified: Secondary | ICD-10-CM | POA: Diagnosis not present

## 2021-01-22 LAB — CBC WITH DIFFERENTIAL/PLATELET
Abs Immature Granulocytes: 0.11 10*3/uL — ABNORMAL HIGH (ref 0.00–0.07)
Basophils Absolute: 0 10*3/uL (ref 0.0–0.1)
Basophils Relative: 0 %
Eosinophils Absolute: 0.5 10*3/uL (ref 0.0–0.5)
Eosinophils Relative: 5 %
HCT: 35.5 % — ABNORMAL LOW (ref 39.0–52.0)
Hemoglobin: 11.5 g/dL — ABNORMAL LOW (ref 13.0–17.0)
Immature Granulocytes: 1 %
Lymphocytes Relative: 20 %
Lymphs Abs: 1.9 10*3/uL (ref 0.7–4.0)
MCH: 30.7 pg (ref 26.0–34.0)
MCHC: 32.4 g/dL (ref 30.0–36.0)
MCV: 94.7 fL (ref 80.0–100.0)
Monocytes Absolute: 1 10*3/uL (ref 0.1–1.0)
Monocytes Relative: 11 %
Neutro Abs: 5.9 10*3/uL (ref 1.7–7.7)
Neutrophils Relative %: 63 %
Platelets: 161 10*3/uL (ref 150–400)
RBC: 3.75 MIL/uL — ABNORMAL LOW (ref 4.22–5.81)
RDW: 13.2 % (ref 11.5–15.5)
WBC: 9.4 10*3/uL (ref 4.0–10.5)
nRBC: 0 % (ref 0.0–0.2)

## 2021-01-22 LAB — CULTURE, BLOOD (ROUTINE X 2)

## 2021-01-22 LAB — MAGNESIUM: Magnesium: 1.7 mg/dL (ref 1.7–2.4)

## 2021-01-22 LAB — COMPREHENSIVE METABOLIC PANEL
ALT: 20 U/L (ref 0–44)
AST: 40 U/L (ref 15–41)
Albumin: 2.1 g/dL — ABNORMAL LOW (ref 3.5–5.0)
Alkaline Phosphatase: 40 U/L (ref 38–126)
Anion gap: 7 (ref 5–15)
BUN: 12 mg/dL (ref 8–23)
CO2: 26 mmol/L (ref 22–32)
Calcium: 7.6 mg/dL — ABNORMAL LOW (ref 8.9–10.3)
Chloride: 108 mmol/L (ref 98–111)
Creatinine, Ser: 0.75 mg/dL (ref 0.61–1.24)
GFR, Estimated: >60 mL/min (ref >60)
Glucose, Bld: 84 mg/dL (ref 70–99)
Potassium: 3.2 mmol/L — ABNORMAL LOW (ref 3.5–5.1)
Sodium: 141 mmol/L (ref 135–145)
Total Bilirubin: 0.8 mg/dL (ref 0.3–1.2)
Total Protein: 4.7 g/dL — ABNORMAL LOW (ref 6.5–8.1)

## 2021-01-22 MED ORDER — POTASSIUM CHLORIDE CRYS ER 20 MEQ PO TBCR
40.0000 meq | EXTENDED_RELEASE_TABLET | Freq: Once | ORAL | Status: AC
  Administered 2021-01-22: 40 meq via ORAL
  Filled 2021-01-22: qty 2

## 2021-01-22 NOTE — TOC Progression Note (Signed)
Transition of Care [facility]) - Progression Note    Patient Details  Name: Larry Cordova MRN: [id number] Date of Birth: Feb 29, 1930  Transition of Care [facility]) CM/SW Contact  Joaquin Courts, RN Phone Number: 01/22/2021, 3:28 PM  Clinical Narrative:    CM spoke with patient's spouse re discharge planning, spouse would like for patient to come home, she is aware PT has recommended SNF.  Spouse reports she has been taking care of patient at home and is available 24/7, additionally their children and grandchildren are nearby and able to assist.  Spouse is in agreement to receive hospital bed and manual wheelchair with cushion.  [employer] to provide equipment,  Rep Jermaine given referral.  Agency will contact family to schedule delivery.  Per spouse, patient's daughter is able to provide transportation home, spouse is aware patient requires total care, which has been his baseline for 4-5 months.     Expected Discharge Plan: Home/Self Care Barriers to Discharge: No Barriers Identified  Expected Discharge Plan and Services Expected Discharge Plan: Home/Self Care   Discharge Planning Services: CM Consult                     DME Arranged: Hospital bed,Lightweight manual wheelchair with seat cushion DME Agency: Other - Comment [facility]) Date DME Agency Contacted: 01/22/21 Time DME Agency Contacted: [phone] Representative spoke with at DME Agency: [place] Interventions    Readmission Risk Interventions No flowsheet data found.

## 2021-01-22 NOTE — Progress Notes (Signed)
PROGRESS NOTE  Larry Cordova  DOB: 08-05-1930  PCP: [name], MD XTK:[id number]  DOA: 01/18/2021  LOS: 4 days   Chief Complaint  Patient presents with  . Weakness   Brief narrative: Larry Cordova is a 87 y.o. male with PMH significant for dementia, hypothyroidism was taking care at home by his family. Patient was brought to the ED on 2/9 for progressive physical and mental decline, low oral intake, dehydration, low blood pressure, decreased urine output, repeated falls.  Patient has history of dementia but was able to walk with a walker until recently.  Because of recent decline, family was in process of getting approved DMEs including hospital bed at home and did not want abdomen nursing home.  He had a nurse visit at home on 2/9 and was suggested to take him to ED.  In the ED, he was afebrile, blood pressure 109/84, breathing on room air Labs with sodium low at 133, BUN/creatinine elevated to 59/5.6. WBC count slightly elevated to 13, lactic acid was elevated to 3.8. Bladder scan noted 962 mL of urine.  Foley catheter inserted Covid PCR positive, without respiratory symptoms.  Patient was admitted to hospitalist service. See below for details.  Subjective: Patient was seen and examined this morning.  Larry Cordova elderly demented male.  Not in distress.  Sleeping, cheerful on waking up.  On IV fluid.  Urine clearing up.  Assessment/Plan: Acute kidney injury -Baseline creatinine normal at 1.02 from a year ago. -Presented with a creatinine of 5.6.  Likely prerenal due to poor oral intake as well as postrenal due to urine retention. -With Foley catheter insertion and IV fluid, creatinine improved. -He has poor oral intake.  Currently on normal saline at 50 mill per hour Recent Labs    01/18/21 1640 01/19/21 0344 01/20/21 0341 01/21/21 0117 01/22/21 0306  BUN 59* 44* 21 16 12   CREATININE 5.60* 3.03* 0.86 0.74 0.75   Acute urinary retention -More than 1000 mL  drained with Foley catheter in the ED -May have underlying BPH.  -Renal ultrasound post catheterization showed decompressed bladder did not show any evidence of hydronephrosis. -Started on Flomax daily. -2/11, patient was noted to have some clots in his urine.  Night nurse remove the Foley catheter and could not put another one. Coud catheter was placed next morning.    Hyponatremia/hyponatremia  -Sodium level currently normal range.  Continue to monitor. Recent Labs  Lab 01/18/21 1640 01/19/21 0344 01/20/21 0341 01/21/21 0117 01/22/21 0306  NA 133* 138 148* 142 141   Lactic acidosis -Probably secondary to poor oral intake, dehydration -Improved with hydration. Recent Labs  Lab 01/18/21 1820 01/18/21 1937 01/19/21 0344  LATICACIDVEN 3.8* 2.7* 1.2   COVID-19 virus infection -No respiratory symptoms.  Currently on room air at 100% -Mild leukocytosis.  Chest x-ray did not show any acute infiltrate. -Not on any treatment for Covid. -Airborne isolation for 10 days..  Tested positive on 2/9.  Staph epidermidis bacteremia -3 out of 4 blood culture obtained on admission is positive for staph epidermidis.  High likely to be true infection.  Unclear source. -Started on IV vancomycin. -Repeat blood culture report pending. -Echocardiogram showed EF of 65 to 70% with grade 1 diastolic dysfunction, no evidence of vegetation.  Elevated troponin -Mild elevation in troponin without cardiac symptoms or EKG changes. -No further ischemic evaluation at this time  Hypothyroidism -Continue Synthroid.  Dementia with behavioral disturbance -Supportive care. -Hospital bed requested by family at home.  Case manager  made aware.  Mobility: PT eval obtained.  SNF was recommended.  However patient's family is considering to take him home with services.  I ordered for hospital bed, wheelchair and cushion as advised. Code Status:   Code Status: Full Code  Nutritional status: Body mass index is  21.88 kg/m. Nutrition Problem: Increased nutrient needs Etiology: acute illness,catabolic illness (TFTDD-22 infection) Signs/Symptoms: estimated needs Diet Order            DIET SOFT Room service appropriate? Yes; Fluid consistency: Thin  Diet effective now                 DVT prophylaxis: enoxaparin (LOVENOX) injection 40 mg Start: 01/20/21 2200   Antimicrobials:  IV vancomycin Fluid: Normal saline at 50 mill per hour Consultants: None Family Communication:  Bedside  Status is: Inpatient  Remains inpatient appropriate because: Needs further IV hydration, pending final blood culture report  Dispo: The patient is from: Home              Anticipated d/c is to: Home with services, DME versus SNF              Anticipated d/c date is: 2 to 3 days              Patient currently is not medically stable to d/c.   Difficult to place patient No   Infusions:  . sodium chloride Stopped (01/21/21 2016)  . vancomycin 1,500 mg (01/21/21 2016)    Scheduled Meds: . (feeding supplement) PROSource Plus  30 mL Oral Daily  . vitamin C  500 mg Oral Daily  . Chlorhexidine Gluconate Cloth  6 each Topical Daily  . divalproex  500 mg Oral QHS  . enoxaparin (LOVENOX) injection  40 mg Subcutaneous Q24H  . feeding supplement  237 mL Oral BID BM  . levothyroxine  112 mcg Oral Q0600  . multivitamin with minerals  1 tablet Oral Daily  . tamsulosin  0.4 mg Oral Daily  . zinc sulfate  220 mg Oral Daily    Antimicrobials: Anti-infectives (From admission, onward)   Start     Dose/Rate Route Frequency Ordered Stop   01/20/21 2200  vancomycin (VANCOREADY) IVPB 1500 mg/300 mL        1,500 mg 150 mL/hr over 120 Minutes Intravenous Every 24 hours 01/20/21 1023     01/20/21 1000  remdesivir 100 mg in sodium chloride 0.9 % 100 mL IVPB  Status:  Discontinued       "Followed by" Linked Group Details   100 mg 200 mL/hr over 30 Minutes Intravenous Daily 01/19/21 0113 01/19/21 0122   01/20/21 1000   remdesivir 100 mg in sodium chloride 0.9 % 100 mL IVPB  Status:  Discontinued       "Followed by" Linked Group Details   100 mg 200 mL/hr over 30 Minutes Intravenous Daily 01/19/21 0122 01/19/21 0818   01/19/21 2030  vancomycin (VANCOREADY) IVPB 1500 mg/300 mL        1,500 mg 150 mL/hr over 120 Minutes Intravenous  Once 01/19/21 1952 01/19/21 2328   01/19/21 2000  remdesivir 200 mg in sodium chloride 0.9% 250 mL IVPB  Status:  Discontinued       "Followed by" Linked Group Details   200 mg 580 mL/hr over 30 Minutes Intravenous Once 01/19/21 0113 01/19/21 0122   01/19/21 1956  vancomycin variable dose per unstable renal function (pharmacist dosing)  Status:  Discontinued         Does not  apply See admin instructions 01/19/21 1957 01/20/21 1023   01/19/21 0200  remdesivir 200 mg in sodium chloride 0.9% 250 mL IVPB       "Followed by" Linked Group Details   200 mg 580 mL/hr over 30 Minutes Intravenous Once 01/19/21 0122 01/19/21 0323      PRN meds: acetaminophen, albuterol, guaiFENesin-dextromethorphan, lip balm, ondansetron **OR** ondansetron (ZOFRAN) IV, polyethylene glycol   Objective: Vitals:   01/21/21 2218 01/22/21 0643  BP: 113/84 (!) 141/81  Pulse: 80 71  Resp: 20 16  Temp: (!) 97.5 F (36.4 C) 97.8 F (36.6 C)  SpO2: 94%     Intake/Output Summary (Last 24 hours) at 01/22/2021 1312 Last data filed at 01/21/2021 2227 Gross per 24 hour  Intake 398.37 ml  Output 425 ml  Net -26.63 ml   Filed Weights   01/19/21 0250  Weight: 77.3 kg   Weight change:  Body mass index is 21.88 kg/m.   Physical Exam: General exam: Torrell, elderly male.  Not in physical distress.  Cheerful demented Skin: No rashes, lesions or ulcers. HEENT: Atraumatic, normocephalic, no obvious bleeding Lungs: Mild Rales on both bases.  Not in respite distress.  Not requiring supplemental oxygen CVS: Regular rate and rhythm, no murmur GI/Abd soft, nontender, nondistended, bowel sound present CNS:  Sleeping, wakes up on touch.  Not restless or agitated.  Cheerfully demented.   Psychiatry: Cheerful Extremities: No pedal edema, no calf tenderness  Data Review: I have personally reviewed the laboratory data and studies available.  Recent Labs  Lab 01/18/21 1640 01/19/21 0344 01/20/21 0341 01/21/21 0117 01/22/21 0306  WBC 13.0* 7.9 8.6 8.9 9.4  NEUTROABS 11.1* 5.8 6.8 6.3 5.9  HGB 15.2 12.5* 12.4* 11.1* 11.5*  HCT 47.4 37.7* 37.2* 35.0* 35.5*  MCV 95.2 93.8 92.1 97.8 94.7  PLT 115* 87* 105* 124* 161   Recent Labs  Lab 01/18/21 1640 01/19/21 0344 01/20/21 0341 01/21/21 0117 01/22/21 0306  NA 133* 138 148* 142 141  K 4.6 3.9 3.6 3.7 3.2*  CL 94* 103 117* 109 108  CO2 20* 24 19* 24 26  GLUCOSE 190* 75 107* 132* 84  BUN 59* 44* 21 16 12   CREATININE 5.60* 3.03* 0.86 0.74 0.75  CALCIUM 8.5* 7.6* 8.0* 7.7* 7.6*  MG  --  2.1 1.7 1.8 1.7    F/u labs ordered Unresulted Labs (From admission, onward)          Start     Ordered   01/19/21 0500  CBC with Differential/Platelet  Daily,   R      01/18/21 2046   01/19/21 0500  Comprehensive metabolic panel  Daily,   R      01/18/21 2046   01/19/21 0500  Magnesium  Daily,   R      01/18/21 2046           Signed, Terrilee Croak, MD [facility] 01/22/2021

## 2021-01-22 NOTE — Plan of Care (Signed)
  Problem: Safety: Goal: Ability to remain free from injury will improve Outcome: Progressing   Problem: Skin Integrity: Goal: Risk for impaired skin integrity will decrease Outcome: Progressing   Problem: Education: Goal: Knowledge of risk factors and measures for prevention of condition will improve Outcome: Progressing   Problem: Respiratory: Goal: Will maintain a patent airway Outcome: Progressing

## 2021-01-23 ENCOUNTER — Telehealth: Payer: Self-pay | Admitting: Family Medicine

## 2021-01-23 DIAGNOSIS — N179 Acute kidney failure, unspecified: Secondary | ICD-10-CM | POA: Diagnosis not present

## 2021-01-23 LAB — COMPREHENSIVE METABOLIC PANEL
ALT: 21 U/L (ref 0–44)
AST: 38 U/L (ref 15–41)
Albumin: 2 g/dL — ABNORMAL LOW (ref 3.5–5.0)
Alkaline Phosphatase: 41 U/L (ref 38–126)
Anion gap: 8 (ref 5–15)
BUN: 8 mg/dL (ref 8–23)
CO2: 24 mmol/L (ref 22–32)
Calcium: 7.7 mg/dL — ABNORMAL LOW (ref 8.9–10.3)
Chloride: 109 mmol/L (ref 98–111)
Creatinine, Ser: 0.58 mg/dL — ABNORMAL LOW (ref 0.61–1.24)
GFR, Estimated: >60 mL/min (ref >60)
Glucose, Bld: 84 mg/dL (ref 70–99)
Potassium: 3.5 mmol/L (ref 3.5–5.1)
Sodium: 141 mmol/L (ref 135–145)
Total Bilirubin: 0.7 mg/dL (ref 0.3–1.2)
Total Protein: 4.7 g/dL — ABNORMAL LOW (ref 6.5–8.1)

## 2021-01-23 LAB — CBC WITH DIFFERENTIAL/PLATELET
Abs Immature Granulocytes: 0.1 10*3/uL — ABNORMAL HIGH (ref 0.00–0.07)
Basophils Absolute: 0 10*3/uL (ref 0.0–0.1)
Basophils Relative: 1 %
Eosinophils Absolute: 0.3 10*3/uL (ref 0.0–0.5)
Eosinophils Relative: 5 %
HCT: 35 % — ABNORMAL LOW (ref 39.0–52.0)
Hemoglobin: 11.4 g/dL — ABNORMAL LOW (ref 13.0–17.0)
Immature Granulocytes: 2 %
Lymphocytes Relative: 18 %
Lymphs Abs: 1.2 10*3/uL (ref 0.7–4.0)
MCH: 30.9 pg (ref 26.0–34.0)
MCHC: 32.6 g/dL (ref 30.0–36.0)
MCV: 94.9 fL (ref 80.0–100.0)
Monocytes Absolute: 0.8 10*3/uL (ref 0.1–1.0)
Monocytes Relative: 13 %
Neutro Abs: 4 10*3/uL (ref 1.7–7.7)
Neutrophils Relative %: 61 %
Platelets: 173 10*3/uL (ref 150–400)
RBC: 3.69 MIL/uL — ABNORMAL LOW (ref 4.22–5.81)
RDW: 13.2 % (ref 11.5–15.5)
WBC: 6.5 10*3/uL (ref 4.0–10.5)
nRBC: 0 % (ref 0.0–0.2)

## 2021-01-23 LAB — MAGNESIUM: Magnesium: 1.5 mg/dL — ABNORMAL LOW (ref 1.7–2.4)

## 2021-01-23 MED ORDER — ENSURE ENLIVE PO LIQD: 237.0000 mL | Freq: Two times a day (BID) | ORAL | 12 refills | Status: AC

## 2021-01-23 MED ORDER — TAMSULOSIN HCL 0.4 MG PO CAPS: 0.4000 mg | ORAL_CAPSULE | Freq: Every day | ORAL | 2 refills | Status: AC

## 2021-01-23 MED ORDER — PROSOURCE PLUS PO LIQD: 30.0000 mL | Freq: Every day | ORAL | 2 refills | Status: AC

## 2021-01-23 MED ORDER — MAGNESIUM SULFATE 4 GM/100ML IV SOLN
4.0000 g | Freq: Once | INTRAVENOUS | Status: AC
  Administered 2021-01-23: 4 g via INTRAVENOUS
  Filled 2021-01-23: qty 100

## 2021-01-23 MED ORDER — LINEZOLID 600 MG PO TABS
600.0000 mg | ORAL_TABLET | Freq: Two times a day (BID) | ORAL | Status: DC
  Administered 2021-01-23: 600 mg via ORAL
  Filled 2021-01-23: qty 1

## 2021-01-23 MED ORDER — TRAZODONE HCL 50 MG PO TABS: 50.0000 mg | ORAL_TABLET | Freq: Every day | ORAL | 2 refills | Status: AC

## 2021-01-23 MED ORDER — GUAIFENESIN-DM 100-10 MG/5ML PO SYRP: 10.0000 mL | ORAL_SOLUTION | ORAL | 0 refills | Status: DC | PRN

## 2021-01-23 MED ORDER — LINEZOLID 600 MG PO TABS: 600.0000 mg | ORAL_TABLET | Freq: Two times a day (BID) | ORAL | 0 refills | Status: AC

## 2021-01-23 NOTE — TOC Transition Note (Signed)
Transition of Care [facility]) - CM/SW Discharge Note   Patient Details  Name: Larry Cordova MRN: [id number] Date of Birth: Apr 09, 1930  [facility] CM/SW Contact:  Trish Mage, LCSW Phone Number: 01/23/2021, 10:08 AM   Clinical Narrative:  Patient who is stable for discharge today is in need of DME.  Spoke with Jermaine at [facility] who states although order came in yesterday, he did not schedule delivery today.  Spoke with daughter [name] who is bedside with patient, she states family is ready for patient to come home today.  She wants to transport with help from brother as they were charged for ambulance transport previously and want to avoid another bill. Called Jermaine to update-he will reach out to family to schedule delivery in AM. [facility] SW, PT ordered and Cindie with [facility] will follow up.  No further needs identifed. TOC sign off.    Final next level of care: Home w Home Health Services Barriers to Discharge: No Barriers Identified   Patient Goals and CMS Choice Patient states their goals for this hospitalization and ongoing recovery are:: to go home      Discharge Placement                       Discharge Plan and Services   Discharge Planning Services: CM Consult            DME Arranged: Hospital bed,Lightweight manual wheelchair with seat cushion DME Agency: Other - Comment [facility]) Date DME Agency Contacted: 01/22/21 Time DME Agency Contacted: [phone] Representative spoke with at DME Agency: [facility] Interventions     Readmission Risk Interventions No flowsheet data found.

## 2021-01-23 NOTE — Plan of Care (Signed)

## 2021-01-23 NOTE — Clinical Social Work Note (Signed)
    Durable Medical Equipment  (From admission, onward)         Start     Ordered   01/21/21 0753  For home use only DME wheelchair cushion (seat and back)  Once        01/21/21 0752   01/21/21 0753  For home use only DME standard manual wheelchair with seat cushion  Once       Comments: Patient suffers from advanced dementia, impaired mobility, total care dependence which impairs their ability to perform daily activities like bathing, dressing, feeding, grooming, and toileting in the home.  A cane, crutch, or walker will not resolve issue with performing activities of daily living. A wheelchair will allow patient to safely perform daily activities. Patient can safely propel the wheelchair in the home or has a caregiver who can provide assistance. Length of need Lifetime. Accessories: elevating leg rests (ELRs), wheel locks, extensions and anti-tippers.   01/21/21 0754   01/21/21 0752  For home use only DME Hospital bed  Once       Question Answer Comment  Length of Need Lifetime   The above medical condition requires: Patient requires the ability to reposition frequently   Head must be elevated greater than: 30 degrees   Bed type Semi-electric   Trapeze Bar Yes   Support Surface: Gel Overlay      01/21/21 0751

## 2021-01-23 NOTE — Care Management Important Message (Signed)
Important Message  Patient Details IM Letter placed in Patient's door [facility]. Name: Larry Cordova MRN: [id number] Date of Birth: 1930/01/17   Medicare Important Message Given:  Yes     Kerin Salen 01/23/2021, 11:53 AM

## 2021-01-23 NOTE — Telephone Encounter (Signed)
Currently inpatient at [facility] long and is suppose to discharge today and they want him to have home health care with [facility] / please advise   Home Health Verbal Orders - Caller/Agency: Cindy/ Santina Evans Number: [phone] secure Vm can be left Requesting skill nursing and social worker  Frequency: eval

## 2021-01-23 NOTE — Progress Notes (Signed)
Pharmacy Antibiotic Note  Larry Cordova is an 87 y.o. male with hx dementia presented to the ED on 01/18/2021 with generalized weakness and was found to have AKI. COVID PCR came back positive.  Blood culture collected on 2/9 grew staph epidermidis (mech A/C+).  He's currently on vancomycin for bacteremia.  Today, 01/23/2021: - day #4 full abx - Tmax 98.6, WBC 6.5, ANC 4, SCr 0.58 2/9 BCx: pan senstive staph epi & Staph haemolyticus R to EES & oxacillin 2/12 repeat BCx2 ngx1day ECHO no vegetations  Plan: continue vancomycin 1500 mg IV q24h for est AUC 466 rec change to zyvox 600 po bid to complete course rec 7 day course of abx _____________________________________  Weight: 77.3 kg (170 lb 6.7 oz)  Temp (24hrs), Avg:98.1 F (36.7 C), Min:97.7 F (36.5 C), Max:98.6 F (37 C)  Recent Labs  Lab 01/18/21 1820 01/18/21 1937 01/19/21 0344 01/20/21 0341 01/21/21 0117 01/22/21 0306 01/23/21 0427  WBC  --   --  7.9 8.6 8.9 9.4 6.5  CREATININE  --   --  3.03* 0.86 0.74 0.75 0.58*  LATICACIDVEN 3.8* 2.7* 1.2  --   --   --   --     CrCl cannot be calculated (Unknown ideal weight.).    No Known Allergies Antimicrobials this admission:  2/10 Remdesivir x1 dose 2/10 vanc>> Dose adjustments this admission:   Microbiology results:  2/9 ucx: neg FINAL 2/9 bcx x2: 3 of 4 staph epi - pan sensitive staph haemolyticus mech A/C+ - resistant only to erythro and oxacillin 2/9 COVID+ 2/12 repeat BCx2: ngtd   Thank you for allowing pharmacy to be a part of this patient's care.  Eudelia Bunch, Pharm.D 01/23/2021 8:38 AM

## 2021-01-23 NOTE — Progress Notes (Signed)
Pt. Discharge via wheelchair accompanied by daughter and son. Pt. Is alert, not on any distress. Discharge instruction and education discussed with daughter. All patient belongings are with the family.

## 2021-01-23 NOTE — Discharge Summary (Signed)
Physician Discharge Summary  Larry Cordova GUR:[id number] DOB: January 06, 1930 DOA: 01/18/2021  PCP: Larry Agreste, MD  Admit date: 01/18/2021 Discharge date: 01/23/2021  Admitted From: Home Discharge disposition: Home with home health and DME   Code Status: Full Code  Diet Recommendation: Regular diet as tolerated  Discharge Diagnosis:   Principal Problem:   AKI (acute kidney injury) ([place]) Active Problems:   Hypothyroidism   Dementia with behavioral disturbance ([place])   Leukocytosis   Dehydration with hyponatremia   Acute urinary retention   Lactic acidosis   Elevated troponin level not due myocardial infarction   COVID-19 virus infection   Chief Complaint  Patient presents with  . Weakness   Brief narrative: Larry Cordova is a 87 y.o. male with PMH significant for dementia, hypothyroidism was taking care at home by his family. Patient was brought to the ED on 2/9 for progressive physical and mental decline, low oral intake, dehydration, low blood pressure, decreased urine output, repeated falls.  Patient has history of dementia but was able to walk with a walker until recently.  Because of recent decline, family was in process of getting approved DMEs including hospital bed at home and did not want abdomen nursing home.  He had a nurse visit at home on 2/9 and was suggested to take him to ED.  In the ED, he was afebrile, blood pressure 109/84, breathing on room air Labs with sodium low at 133, BUN/creatinine elevated to 59/5.6. WBC count slightly elevated to 13, lactic acid was elevated to 3.8. Bladder scan noted 962 mL of urine.  Foley catheter inserted Covid PCR positive, without respiratory symptoms.  Patient was admitted to hospitalist service. See below for details.  Subjective: Patient was seen and examined this morning.  [name] elderly demented male.  Not in distress.  Sleeping, cheerful on waking up.   Daughter at bedside.  States he had some restlessness  last night  Hospital course: Acute kidney injury -Baseline creatinine normal at 1.02 from a year ago. -Presented with a creatinine of 5.6.  Likely prerenal due to poor oral intake as well as postrenal due to urine retention. -Foley catheter inserted.  IV fluid given. -Because of poor oral intake, he was kept on IV hydration.  Creatinine corrected to normal. Recent Labs    01/18/21 1640 01/19/21 0344 01/20/21 0341 01/21/21 0117 01/22/21 0306 01/23/21 0427  BUN 59* 44* 21 16 12 8   CREATININE 5.60* 3.03* 0.86 0.74 0.75 0.58*   Acute urinary retention -More than 1000 mL drained with Foley catheter in the ED -May have underlying BPH.  -Renal ultrasound post catheterization showed decompressed bladder did not show any evidence of hydronephrosis. -Started on Flomax daily. -2/11, patient was noted to have some clots in his urine.  Night nurse remove the Foley catheter and could not put another one. Coud catheter was placed next morning.   -We will discharge him home with Foley catheter today.  Referral given for [facility] follow-up for voiding trial.  Hyponatremia/hyponatremia  -Sodium level currently normal range.   Recent Labs  Lab 01/18/21 1640 01/19/21 0344 01/20/21 0341 01/21/21 0117 01/22/21 0306 01/23/21 0427  NA 133* 138 148* 142 141 141   Lactic acidosis -Probably secondary to poor oral intake, dehydration -Improved with hydration. Recent Labs  Lab 01/18/21 1820 01/18/21 1937 01/19/21 0344  LATICACIDVEN 3.8* 2.7* 1.2   COVID-19 virus infection -No respiratory symptoms.  Currently on room air at 100% -Mild leukocytosis.  Chest x-ray did not  show any acute infiltrate. -Not on any treatment for Covid. -Airborne isolation for 10 days.. Tested positive on 2/9.  Staph epidermidis bacteremia -3 out of 4 blood culture obtained on admission is positive for staph epidermidis.  High likely to be true infection.  Unclear source. -In the hospital, patient was  treated with IV vancomycin. -Repeat blood culture report has not shown any growth so far. -Echocardiogram showed EF of 65 to 70% with grade 1 diastolic dysfunction, no evidence of vegetation. -Discussed with pharmacy.  We will discharge the patient on oral Zyvox 600 mg twice daily for next 3 days to complete 7-day course of antibiotics.  Elevated troponin -Mild elevation in troponin without cardiac symptoms or EKG changes. -No further ischemic evaluation at this time  Hypothyroidism -Continue Synthroid.  Dementia with behavioral disturbance -Supportive care. -Trazodone 50 mg daily at bedtime ordered at discharge. [facility] bed requested by family at home.  Case manager made aware.  Stable to discharge to home today. Home health social work consult ordered As requested by family, ordered for hospital bed and other DME's.  Wound care:    Discharge Exam:   Vitals:   01/22/21 0643 01/22/21 1346 01/22/21 2003 01/23/21 0549  BP: (!) 141/81 137/83 (!) 149/88 (!) 154/79  Pulse: 71 65 83 90  Resp: 16 (!) 23 20 20   Temp: 97.8 F (36.6 C) 97.7 F (36.5 C) 97.9 F (36.6 C) 98.6 F (37 C)  TempSrc: Oral Oral Oral Axillary  SpO2:  97% 96% 96%  Weight:        Body mass index is 21.88 kg/m.  General exam: [name] elderly Caucasian male.  Demented, lying on bed. Skin: No rashes, lesions or ulcers. HEENT: Atraumatic, normocephalic, no obvious bleeding Lungs: Clear to auscultate bilaterally CVS: Regular rate and rhythm, no murmur GI/Abd soft, nontender, nondistended, bowel sound present CNS: Sleeping, opens eyes on verbal command, cheerful Psychiatry: Mood appropriate Extremities: No pedal edema, no calf tenderness  Follow ups:   Discharge Instructions    Diet general   Complete by: As directed    Increase activity slowly   Complete by: As directed       Follow-up Information    Larry Agreste, MD Follow up.   Specialties: Family Medicine, Sports Medicine Contact  information: [place] [address] [phone]        [place] Follow up.   Contact information: [place] [phone]              Recommendations for Outpatient Follow-Up:   1. Follow-up with PCP as an outpatient  Discharge Instructions:  Follow with Primary MD Larry Agreste, MD in 7 days   Get CBC/BMP checked in next visit within 1 week by PCP or SNF MD ( we routinely change or add medications that can affect your baseline labs and fluid status, therefore we recommend that you get the mentioned basic workup next visit with your PCP, your PCP may decide not to get them or add new tests based on their clinical decision)  On your next visit with your PCP, please Get Medicines reviewed and adjusted.  Please request your PCP  to go over all Hospital Tests and Procedure/Radiological results at the follow up, please get all Hospital records sent to your Prim MD by signing hospital release before you go home.  Activity: As tolerated with Full fall precautions use walker/cane & assistance as needed  For Heart failure  patients - Check your Weight same time everyday, if you gain over 2 pounds, or you develop in leg swelling, experience more shortness of breath or chest pain, call your Primary MD immediately. Follow Cardiac Low Salt Diet and 1.5 lit/day fluid restriction.  If you have smoked or chewed Tobacco in the last 2 yrs please stop smoking, stop any regular Alcohol  and or any Recreational drug use.  If you experience worsening of your admission symptoms, develop shortness of breath, life threatening emergency, suicidal or homicidal thoughts you must seek medical attention immediately by calling 911 or calling your MD immediately  if symptoms less severe.  You Must read complete instructions/literature along with all the possible adverse reactions/side effects for all the Medicines you take and  that have been prescribed to you. Take any new Medicines after you have completely understood and accpet all the possible adverse reactions/side effects.   Do not drive, operate heavy machinery, perform activities at heights, swimming or participation in water activities or provide baby sitting services if your were admitted for syncope or siezures until you have seen by Primary MD or a Neurologist and advised to do so again.  Do not drive when taking Pain medications.  Do not take more than prescribed Pain, Sleep and Anxiety Medications  Wear Seat belts while driving.   Please note You were cared for by a hospitalist during your hospital stay. If you have any questions about your discharge medications or the care you received while you were in the hospital after you are discharged, you can call the unit and asked to speak with the hospitalist on call if the hospitalist that took care of you is not available. Once you are discharged, your primary care physician will handle any further medical issues. Please note that NO REFILLS for any discharge medications will be authorized once you are discharged, as it is imperative that you return to your primary care physician (or establish a relationship with a primary care physician if you do not have one) for your aftercare needs so that they can reassess your need for medications and monitor your lab values.    Allergies as of 01/23/2021   No Known Allergies     Medication List    STOP taking these medications   ibuprofen 200 MG tablet Commonly known as: ADVIL     TAKE these medications   (feeding supplement) PROSource Plus liquid Take 30 mLs by mouth daily.   feeding supplement Liqd Take 237 mLs by mouth 2 (two) times daily between meals.   divalproex 250 MG 24 hr tablet Commonly known as: DEPAKOTE ER TAKE 2 TABLETS(500 MG) BY MOUTH AT BEDTIME   guaiFENesin-dextromethorphan 100-10 MG/5ML syrup Commonly known as: ROBITUSSIN DM Take 10  mLs by mouth every 4 (four) hours as needed for cough.   levothyroxine 112 MCG tablet Commonly known as: SYNTHROID TAKE 1 TABLET(112 MCG) BY MOUTH DAILY   linezolid 600 MG tablet Commonly known as: ZYVOX Take 1 tablet (600 mg total) by mouth every 12 (twelve) hours for 3 days.   tamsulosin 0.4 MG Caps capsule Commonly known as: FLOMAX Take 1 capsule (0.4 mg total) by mouth daily. Start taking on: January 24, 2021   traZODone 50 MG tablet Commonly known as: DESYREL Take 1 tablet (50 mg total) by mouth at bedtime.            Durable Medical Equipment  (From admission, onward)         Start  Ordered   01/21/21 0753  For home use only DME wheelchair cushion (seat and back)  Once        01/21/21 0752   01/21/21 0753  For home use only DME standard manual wheelchair with seat cushion  Once       Comments: Patient suffers from advanced dementia, impaired mobility, total care dependence which impairs their ability to perform daily activities like bathing, dressing, feeding, grooming, and toileting in the home.  A cane, crutch, or walker will not resolve issue with performing activities of daily living. A wheelchair will allow patient to safely perform daily activities. Patient can safely propel the wheelchair in the home or has a caregiver who can provide assistance. Length of need Lifetime. Accessories: elevating leg rests (ELRs), wheel locks, extensions and anti-tippers.   01/21/21 0754   01/21/21 0752  For home use only DME Hospital bed  Once       Question Answer Comment  Length of Need Lifetime   The above medical condition requires: Patient requires the ability to reposition frequently   Head must be elevated greater than: 30 degrees   Bed type Semi-electric   Trapeze Bar Yes   Support Surface: Gel Overlay      01/21/21 0751          Time coordinating discharge: 35 minutes  The results of significant diagnostics from this hospitalization (including imaging,  microbiology, ancillary and laboratory) are listed below for reference.    Procedures and Diagnostic Studies:   CT Head Wo Contrast  Result Date: 01/18/2021 CLINICAL DATA:  Head trauma, moderate/severe. Facial trauma. Neck trauma. Additional history provided: Patient presents with weakness/malaise, history of dementia with increased falls recently, 3 falls on Saturday. EXAM: CT HEAD WITHOUT CONTRAST CT MAXILLOFACIAL WITHOUT CONTRAST CT CERVICAL SPINE WITHOUT CONTRAST TECHNIQUE: Multidetector CT imaging of the head, cervical spine, and maxillofacial structures were performed using the standard protocol without intravenous contrast. Multiplanar CT image reconstructions of the cervical spine and maxillofacial structures were also generated. COMPARISON:  Head CT 12/17/2016. FINDINGS: CT HEAD FINDINGS Brain: Mildly motion degraded exam. Moderate to moderately advanced cerebral atrophy. Comparatively mild cerebellar atrophy. Lateral and third ventriculomegaly, which may be due to from predominant atrophy. Mild patchy and ill-defined hypoattenuation within the cerebral white matter is nonspecific, but compatible with chronic small vessel ischemic disease. There is no acute intracranial hemorrhage. No demarcated cortical infarct. No extra-axial fluid collection. No evidence of intracranial mass. No midline shift. Vascular: No hyperdense vessel. Skull: No hyperdense vessel. Atherosclerotic calcifications. Other: Possible small left occipital scalp hematoma. CT MAXILLOFACIAL FINDINGS Osseous: No acute maxillofacial fracture is identified. Orbits: No acute finding. The globes are normal in size and contour. The extraocular muscles and optic nerve sheath complexes are symmetric and unremarkable. Sinuses: Mild paranasal sinus mucosal thickening, most notably ethmoidal. Soft tissues: No maxillofacial soft tissue swelling appreciable by CT. Other: Poor dentition with multiple absent and carious teeth and multifocal periapical  lucencies. CT CERVICAL SPINE FINDINGS Alignment: Straightening of the expected cervical lordosis. Trace fused C4-C5 grade 1 anterolisthesis. 3 mm C7-T1 grade 1 anterolisthesis. Skull base and vertebrae: The basion-dental and atlanto-dental intervals are maintained.No evidence of acute fracture to the cervical spine. Soft tissues and spinal canal: No prevertebral fluid or swelling. No visible canal hematoma. Disc levels: Cervical spondylosis with advanced multilevel disc space narrowing, disc bulges, posterior disc osteophytes, uncovertebral hypertrophy and facet arthrosis. Fusion across the C4-C5 and C6-C7 disc spaces. There may also be early osseous fusion across the  C7-T1 disc space. Multilevel bridging ventral osteophytes. Facet joint ankylosis on the left at C4-C5 and bilaterally at C6-C7. Degenerative changes are also present at the C1-C2 articulation. Upper chest: No consolidation within the imaged lung apices. No visible pneumothorax. IMPRESSION: CT head: 1. Mildly motion degraded exam. 2. No acute posttraumatic intracranial findings. 3. Possible small left occipital scalp hematoma. 4. Moderate to moderately advanced cerebral atrophy, progressed as compared to the head CT of 12/27/2016. 5. Lateral and third ventriculomegaly has also progressed. This may be due to central predominant atrophy. A component of communicating/normal pressure hydrocephalus is difficult to exclude. 6. Stable mild cerebral white matter chronic small vessel ischemic disease. CT maxillofacial: 1. No evidence of acute maxillofacial fracture. 2. Mild paranasal sinus mucosal thickening, most notably ethmoidal. CT cervical spine: 1. No evidence of acute fracture to the cervical spine. 2. 3 mm C7-T1 grade 1 anterolisthesis. 3. Advanced cervical spondylosis as described with multilevel degenerative fusion. Electronically Signed   By: Kellie Simmering DO   On: 01/18/2021 19:05   CT Cervical Spine Wo Contrast  Result Date: 01/18/2021 CLINICAL  DATA:  Head trauma, moderate/severe. Facial trauma. Neck trauma. Additional history provided: Patient presents with weakness/malaise, history of dementia with increased falls recently, 3 falls on Saturday. EXAM: CT HEAD WITHOUT CONTRAST CT MAXILLOFACIAL WITHOUT CONTRAST CT CERVICAL SPINE WITHOUT CONTRAST TECHNIQUE: Multidetector CT imaging of the head, cervical spine, and maxillofacial structures were performed using the standard protocol without intravenous contrast. Multiplanar CT image reconstructions of the cervical spine and maxillofacial structures were also generated. COMPARISON:  Head CT 12/17/2016. FINDINGS: CT HEAD FINDINGS Brain: Mildly motion degraded exam. Moderate to moderately advanced cerebral atrophy. Comparatively mild cerebellar atrophy. Lateral and third ventriculomegaly, which may be due to from predominant atrophy. Mild patchy and ill-defined hypoattenuation within the cerebral white matter is nonspecific, but compatible with chronic small vessel ischemic disease. There is no acute intracranial hemorrhage. No demarcated cortical infarct. No extra-axial fluid collection. No evidence of intracranial mass. No midline shift. Vascular: No hyperdense vessel. Skull: No hyperdense vessel. Atherosclerotic calcifications. Other: Possible small left occipital scalp hematoma. CT MAXILLOFACIAL FINDINGS Osseous: No acute maxillofacial fracture is identified. Orbits: No acute finding. The globes are normal in size and contour. The extraocular muscles and optic nerve sheath complexes are symmetric and unremarkable. Sinuses: Mild paranasal sinus mucosal thickening, most notably ethmoidal. Soft tissues: No maxillofacial soft tissue swelling appreciable by CT. Other: Poor dentition with multiple absent and carious teeth and multifocal periapical lucencies. CT CERVICAL SPINE FINDINGS Alignment: Straightening of the expected cervical lordosis. Trace fused C4-C5 grade 1 anterolisthesis. 3 mm C7-T1 grade 1  anterolisthesis. Skull base and vertebrae: The basion-dental and atlanto-dental intervals are maintained.No evidence of acute fracture to the cervical spine. Soft tissues and spinal canal: No prevertebral fluid or swelling. No visible canal hematoma. Disc levels: Cervical spondylosis with advanced multilevel disc space narrowing, disc bulges, posterior disc osteophytes, uncovertebral hypertrophy and facet arthrosis. Fusion across the C4-C5 and C6-C7 disc spaces. There may also be early osseous fusion across the C7-T1 disc space. Multilevel bridging ventral osteophytes. Facet joint ankylosis on the left at C4-C5 and bilaterally at C6-C7. Degenerative changes are also present at the C1-C2 articulation. Upper chest: No consolidation within the imaged lung apices. No visible pneumothorax. IMPRESSION: CT head: 1. Mildly motion degraded exam. 2. No acute posttraumatic intracranial findings. 3. Possible small left occipital scalp hematoma. 4. Moderate to moderately advanced cerebral atrophy, progressed as compared to the head CT of 12/27/2016. 5. Lateral and  third ventriculomegaly has also progressed. This may be due to central predominant atrophy. A component of communicating/normal pressure hydrocephalus is difficult to exclude. 6. Stable mild cerebral white matter chronic small vessel ischemic disease. CT maxillofacial: 1. No evidence of acute maxillofacial fracture. 2. Mild paranasal sinus mucosal thickening, most notably ethmoidal. CT cervical spine: 1. No evidence of acute fracture to the cervical spine. 2. 3 mm C7-T1 grade 1 anterolisthesis. 3. Advanced cervical spondylosis as described with multilevel degenerative fusion. Electronically Signed   By: Kellie Simmering DO   On: 01/18/2021 19:05   US RENAL  Result Date: 01/18/2021 CLINICAL DATA:  Renal failure EXAM: RENAL / URINARY TRACT ULTRASOUND COMPLETE COMPARISON:  None. FINDINGS: Right Kidney: Renal measurements: 11.7 x 5.7 x 4.4 cm = volume: 153 mL. 1.2 cm cyst  in the midpole. Normal echotexture. No hydronephrosis. Left Kidney: Renal measurements: 11.2 x 5.7 x 4.6 cm = volume: 152 mL. 1.4 cm cyst in the lower pole. Normal echotexture. No hydronephrosis. Bladder: Decompressed with Foley catheter in place. Other: None. IMPRESSION: No acute findings.  No hydronephrosis. Electronically Signed   By: Rolm Baptise M.D.   On: 01/18/2021 21:39   DG Chest Portable 1 View  Result Date: 01/18/2021 CLINICAL DATA:  Weakness, malaise for 1 week, dementia EXAM: PORTABLE CHEST 1 VIEW COMPARISON:  12/07/2016 FINDINGS: Single frontal view of the chest demonstrates an unremarkable cardiac silhouette. Lung volumes are diminished, with chronic scarring at the lung bases. Patchy consolidation at the left lung base likely represents atelectasis. No effusion or pneumothorax. No acute bony abnormalities. IMPRESSION: 1. Low lung volumes.  No acute airspace disease. Electronically Signed   By: Randa Ngo M.D.   On: 01/18/2021 17:34   CT Maxillofacial Wo Contrast  Result Date: 01/18/2021 CLINICAL DATA:  Head trauma, moderate/severe. Facial trauma. Neck trauma. Additional history provided: Patient presents with weakness/malaise, history of dementia with increased falls recently, 3 falls on Saturday. EXAM: CT HEAD WITHOUT CONTRAST CT MAXILLOFACIAL WITHOUT CONTRAST CT CERVICAL SPINE WITHOUT CONTRAST TECHNIQUE: Multidetector CT imaging of the head, cervical spine, and maxillofacial structures were performed using the standard protocol without intravenous contrast. Multiplanar CT image reconstructions of the cervical spine and maxillofacial structures were also generated. COMPARISON:  Head CT 12/17/2016. FINDINGS: CT HEAD FINDINGS Brain: Mildly motion degraded exam. Moderate to moderately advanced cerebral atrophy. Comparatively mild cerebellar atrophy. Lateral and third ventriculomegaly, which may be due to from predominant atrophy. Mild patchy and ill-defined hypoattenuation within the cerebral  white matter is nonspecific, but compatible with chronic small vessel ischemic disease. There is no acute intracranial hemorrhage. No demarcated cortical infarct. No extra-axial fluid collection. No evidence of intracranial mass. No midline shift. Vascular: No hyperdense vessel. Skull: No hyperdense vessel. Atherosclerotic calcifications. Other: Possible small left occipital scalp hematoma. CT MAXILLOFACIAL FINDINGS Osseous: No acute maxillofacial fracture is identified. Orbits: No acute finding. The globes are normal in size and contour. The extraocular muscles and optic nerve sheath complexes are symmetric and unremarkable. Sinuses: Mild paranasal sinus mucosal thickening, most notably ethmoidal. Soft tissues: No maxillofacial soft tissue swelling appreciable by CT. Other: Poor dentition with multiple absent and carious teeth and multifocal periapical lucencies. CT CERVICAL SPINE FINDINGS Alignment: Straightening of the expected cervical lordosis. Trace fused C4-C5 grade 1 anterolisthesis. 3 mm C7-T1 grade 1 anterolisthesis. Skull base and vertebrae: The basion-dental and atlanto-dental intervals are maintained.No evidence of acute fracture to the cervical spine. Soft tissues and spinal canal: No prevertebral fluid or swelling. No visible canal hematoma. Disc levels:  Cervical spondylosis with advanced multilevel disc space narrowing, disc bulges, posterior disc osteophytes, uncovertebral hypertrophy and facet arthrosis. Fusion across the C4-C5 and C6-C7 disc spaces. There may also be early osseous fusion across the C7-T1 disc space. Multilevel bridging ventral osteophytes. Facet joint ankylosis on the left at C4-C5 and bilaterally at C6-C7. Degenerative changes are also present at the C1-C2 articulation. Upper chest: No consolidation within the imaged lung apices. No visible pneumothorax. IMPRESSION: CT head: 1. Mildly motion degraded exam. 2. No acute posttraumatic intracranial findings. 3. Possible small left  occipital scalp hematoma. 4. Moderate to moderately advanced cerebral atrophy, progressed as compared to the head CT of 12/27/2016. 5. Lateral and third ventriculomegaly has also progressed. This may be due to central predominant atrophy. A component of communicating/normal pressure hydrocephalus is difficult to exclude. 6. Stable mild cerebral white matter chronic small vessel ischemic disease. CT maxillofacial: 1. No evidence of acute maxillofacial fracture. 2. Mild paranasal sinus mucosal thickening, most notably ethmoidal. CT cervical spine: 1. No evidence of acute fracture to the cervical spine. 2. 3 mm C7-T1 grade 1 anterolisthesis. 3. Advanced cervical spondylosis as described with multilevel degenerative fusion. Electronically Signed   By: Kellie Simmering DO   On: 01/18/2021 19:05     Labs:   Basic Metabolic Panel: Recent Labs  Lab 01/19/21 0344 01/20/21 0341 01/21/21 0117 01/22/21 0306 01/23/21 0427  NA 138 148* 142 141 141  K 3.9 3.6 3.7 3.2* 3.5  CL 103 117* 109 108 109  CO2 24 19* 24 26 24   GLUCOSE 75 107* 132* 84 84  BUN 44* 21 16 12 8   CREATININE 3.03* 0.86 0.74 0.75 0.58*  CALCIUM 7.6* 8.0* 7.7* 7.6* 7.7*  MG 2.1 1.7 1.8 1.7 1.5*   GFR CrCl cannot be calculated (Unknown ideal weight.). Liver Function Tests: Recent Labs  Lab 01/19/21 0344 01/20/21 0341 01/21/21 0117 01/22/21 0306 01/23/21 0427  AST 43* 45* 43* 40 38  ALT 24 21 19 20 21   ALKPHOS 42 39 39 40 41  BILITOT 0.9 0.7 0.8 0.8 0.7  PROT 4.9* 5.1* 4.5* 4.7* 4.7*  ALBUMIN 2.3* 2.2* 2.0* 2.1* 2.0*   No results for input(s): LIPASE, AMYLASE in the last 168 hours. No results for input(s): AMMONIA in the last 168 hours. Coagulation profile No results for input(s): INR, PROTIME in the last 168 hours.  CBC: Recent Labs  Lab 01/19/21 0344 01/20/21 0341 01/21/21 0117 01/22/21 0306 01/23/21 0427  WBC 7.9 8.6 8.9 9.4 6.5  NEUTROABS 5.8 6.8 6.3 5.9 4.0  HGB 12.5* 12.4* 11.1* 11.5* 11.4*  HCT 37.7* 37.2*  35.0* 35.5* 35.0*  MCV 93.8 92.1 97.8 94.7 94.9  PLT 87* 105* 124* 161 173   Cardiac Enzymes: No results for input(s): CKTOTAL, CKMB, CKMBINDEX, TROPONINI in the last 168 hours. BNP: Invalid input(s): POCBNP CBG: Recent Labs  Lab 01/18/21 1305  GLUCAP 109*   D-Dimer No results for input(s): DDIMER in the last 72 hours. Hgb A1c No results for input(s): HGBA1C in the last 72 hours. Lipid Profile No results for input(s): CHOL, HDL, LDLCALC, TRIG, CHOLHDL, LDLDIRECT in the last 72 hours. Thyroid function studies No results for input(s): TSH, T4TOTAL, T3FREE, THYROIDAB in the last 72 hours.  Invalid input(s): FREET3 Anemia work up No results for input(s): VITAMINB12, FOLATE, FERRITIN, TIBC, IRON, RETICCTPCT in the last 72 hours. Microbiology Recent Results (from the past 240 hour(s))  Resp Panel by RT-PCR (Flu A&B, Covid) Nasopharyngeal Swab     Status: Abnormal  Collection Time: 01/18/21  5:42 PM   Specimen: Nasopharyngeal Swab; Nasopharyngeal(NP) swabs in vial transport medium  Result Value Ref Range Status   SARS Coronavirus 2 by RT PCR POSITIVE (A) NEGATIVE Final    Comment: RESULT CALLED TO, READ BACK BY AND VERIFIED WITH: SMALLWOOD,C RN @1851  ON 01/18/21 JACKSON,K (NOTE) SARS-CoV-2 target nucleic acids are DETECTED.  The SARS-CoV-2 RNA is generally detectable in upper respiratory specimens during the acute phase of infection. Positive results are indicative of the presence of the identified virus, but do not rule out bacterial infection or co-infection with other pathogens not detected by the test. Clinical correlation with patient history and other diagnostic information is necessary to determine patient infection status. The expected result is Negative.  Fact Sheet for Patients: EntrepreneurPulse.com.au  Fact Sheet for Healthcare Providers: IncredibleEmployment.be  This test is not yet approved or cleared by the [place]  FDA and  has been authorized for detection and/or diagnosis of SARS-CoV-2 by FDA under an Emergency Use Authorization (EUA).  This EUA will remain in effect (meaning this test  can be used) for the duration of  the COVID-19 declaration under Section 564(b)(1) of the Act, 21 U.S.C. section 360bbb-3(b)(1), unless the authorization is terminated or revoked sooner.     Influenza A by PCR NEGATIVE NEGATIVE Final   Influenza B by PCR NEGATIVE NEGATIVE Final    Comment: (NOTE) The Xpert Xpress SARS-CoV-2/FLU/RSV plus assay is intended as an aid in the diagnosis of influenza from Nasopharyngeal swab specimens and should not be used as a sole basis for treatment. Nasal washings and aspirates are unacceptable for Xpert Xpress SARS-CoV-2/FLU/RSV testing.  Fact Sheet for Patients: EntrepreneurPulse.com.au  Fact Sheet for Healthcare Providers: IncredibleEmployment.be  This test is not yet approved or cleared by the [place] FDA and has been authorized for detection and/or diagnosis of SARS-CoV-2 by FDA under an Emergency Use Authorization (EUA). This EUA will remain in effect (meaning this test can be used) for the duration of the COVID-19 declaration under Section 564(b)(1) of the Act, 21 U.S.C. section 360bbb-3(b)(1), unless the authorization is terminated or revoked.  Performed at [facility], [place] [address] [place], [place] [address]   Blood culture (routine x 2)     Status: Abnormal   Collection Time: 01/18/21  6:15 PM   Specimen: BLOOD RIGHT Voong  Result Value Ref Range Status   Specimen Description   Final    BLOOD RIGHT Aguiniga Performed at [facility] [address], [place], [place] [address]    Special Requests   Final    BOTTLES DRAWN AEROBIC AND ANAEROBIC Blood Culture results may not be optimal due to an inadequate volume of blood received in culture bottles Performed at [facility] [address], [place], [place] [address]    Culture  Setup Time   Final    GRAM POSITIVE COCCI IN CLUSTERS IN BOTH AEROBIC AND ANAEROBIC BOTTLES IDENTIFICATION TO FOLLOW CRITICAL RESULT CALLED TO, READ BACK BY AND VERIFIED WITHCristopher Estimable [facility] 6294 01/19/21 A BROWNING Performed at [facility], [place] [address], [place], [place] [address]    Culture (A)  Final    STAPHYLOCOCCUS HAEMOLYTICUS STAPHYLOCOCCUS EPIDERMIDIS    Report Status 01/22/2021 FINAL  Final   Organism ID, Bacteria STAPHYLOCOCCUS HAEMOLYTICUS  Final   Organism ID, Bacteria STAPHYLOCOCCUS EPIDERMIDIS  Final      Susceptibility   Staphylococcus epidermidis - MIC*    CIPROFLOXACIN <=0.5 SENSITIVE Sensitive  ERYTHROMYCIN <=0.25 SENSITIVE Sensitive     GENTAMICIN <=0.5 SENSITIVE Sensitive     OXACILLIN <=0.25 SENSITIVE Sensitive     TETRACYCLINE <=1 SENSITIVE Sensitive     VANCOMYCIN <=0.5 SENSITIVE Sensitive     TRIMETH/SULFA <=10 SENSITIVE Sensitive     CLINDAMYCIN <=0.25 SENSITIVE Sensitive     RIFAMPIN <=0.5 SENSITIVE Sensitive     Inducible Clindamycin NEGATIVE Sensitive     * STAPHYLOCOCCUS EPIDERMIDIS   Staphylococcus haemolyticus - MIC*    CIPROFLOXACIN <=0.5 SENSITIVE Sensitive     ERYTHROMYCIN >=8 RESISTANT Resistant     GENTAMICIN 4 SENSITIVE Sensitive     OXACILLIN >=4 RESISTANT Resistant     TETRACYCLINE <=1 SENSITIVE Sensitive     VANCOMYCIN <=0.5 SENSITIVE Sensitive     TRIMETH/SULFA <=10 SENSITIVE Sensitive     CLINDAMYCIN <=0.25 SENSITIVE Sensitive     RIFAMPIN <=0.5 SENSITIVE Sensitive     Inducible Clindamycin NEGATIVE Sensitive     * STAPHYLOCOCCUS HAEMOLYTICUS  Blood culture (routine x 2)     Status: Abnormal   Collection Time: 01/18/21  6:20 PM   Specimen: BLOOD  Result Value Ref Range Status   Specimen Description   Final    BLOOD RIGHT ANTECUBITAL Performed at [facility] [address], [place], [place] [address]    Special Requests    Final    BOTTLES DRAWN AEROBIC AND ANAEROBIC Blood Culture results may not be optimal due to an inadequate volume of blood received in culture bottles Performed at [facility] [address] [place], [place] [address]    Culture  Setup Time   Final    GRAM POSITIVE COCCI IN CLUSTERS IN BOTH AEROBIC AND ANAEROBIC BOTTLES CRITICAL RESULT CALLED TO, READ BACK BY AND VERIFIED WITHCristopher Estimable PHARMD [id number] 01/19/21 A BROWNING    Culture (A)  Final    STAPHYLOCOCCUS EPIDERMIDIS STAPHYLOCOCCUS HAEMOLYTICUS SUSCEPTIBILITIES PERFORMED ON PREVIOUS CULTURE WITHIN THE LAST 5 DAYS. Performed at [facility], [place] [address], [place], [place] [address]    Report Status 01/22/2021 FINAL  Final  Blood Culture ID Panel (Reflexed)     Status: Abnormal   Collection Time: 01/18/21  6:20 PM  Result Value Ref Range Status   Enterococcus faecalis NOT DETECTED NOT DETECTED Final   Enterococcus Faecium NOT DETECTED NOT DETECTED Final   Listeria monocytogenes NOT DETECTED NOT DETECTED Final   Staphylococcus species DETECTED (A) NOT DETECTED Final    Comment: CRITICAL RESULT CALLED TO, READ BACK BY AND VERIFIED WITHCristopher Estimable PHARMD 1923 01/19/21 A BROWNING    Staphylococcus aureus (BCID) NOT DETECTED NOT DETECTED Final   Staphylococcus epidermidis DETECTED (A) NOT DETECTED Final    Comment: Methicillin (oxacillin) resistant coagulase negative staphylococcus. Possible blood culture contaminant (unless isolated from more than one blood culture draw or clinical case suggests pathogenicity). No antibiotic treatment is indicated for blood  culture contaminants. CRITICAL RESULT CALLED TO, READ BACK BY AND VERIFIED WITH: Cristopher Estimable PHARMD 3875 01/19/21 A BROWNING    Staphylococcus lugdunensis NOT DETECTED NOT DETECTED Final   Streptococcus species NOT DETECTED NOT DETECTED Final   Streptococcus agalactiae NOT DETECTED NOT DETECTED Final   Streptococcus pneumoniae NOT DETECTED NOT  DETECTED Final   Streptococcus pyogenes NOT DETECTED NOT DETECTED Final   A.calcoaceticus-baumannii NOT DETECTED NOT DETECTED Final   Bacteroides fragilis NOT DETECTED NOT DETECTED Final   Enterobacterales NOT DETECTED NOT DETECTED Final   Enterobacter cloacae complex NOT DETECTED NOT DETECTED Final  Escherichia coli NOT DETECTED NOT DETECTED Final   Klebsiella aerogenes NOT DETECTED NOT DETECTED Final   Klebsiella oxytoca NOT DETECTED NOT DETECTED Final   Klebsiella pneumoniae NOT DETECTED NOT DETECTED Final   Proteus species NOT DETECTED NOT DETECTED Final   Salmonella species NOT DETECTED NOT DETECTED Final   Serratia marcescens NOT DETECTED NOT DETECTED Final   Haemophilus influenzae NOT DETECTED NOT DETECTED Final   Neisseria meningitidis NOT DETECTED NOT DETECTED Final   Pseudomonas aeruginosa NOT DETECTED NOT DETECTED Final   Stenotrophomonas maltophilia NOT DETECTED NOT DETECTED Final   Candida albicans NOT DETECTED NOT DETECTED Final   Candida auris NOT DETECTED NOT DETECTED Final   Candida glabrata NOT DETECTED NOT DETECTED Final   Candida krusei NOT DETECTED NOT DETECTED Final   Candida parapsilosis NOT DETECTED NOT DETECTED Final   Candida tropicalis NOT DETECTED NOT DETECTED Final   Cryptococcus neoformans/gattii NOT DETECTED NOT DETECTED Final   Methicillin resistance mecA/C DETECTED (A) NOT DETECTED Final    Comment: CRITICAL RESULT CALLED TO, READ BACK BY AND VERIFIED WITHCristopher Estimable [facility] 0454 01/19/21 A BROWNING Performed at [facility], [address] [place], [place] [address]   Urine Culture     Status: None   Collection Time: 01/18/21  9:03 PM   Specimen: Urine, Random  Result Value Ref Range Status   Specimen Description   Final    URINE, RANDOM Performed at [facility] [address] [place], [place] [address]    Special Requests   Final    NONE Performed at [facility], [place] [address],  [place], [place] [address]    Culture   Final    NO GROWTH Performed at [facility], [place] [address] [place], [place] [address]    Report Status 01/20/2021 FINAL  Final  Culture, blood (Routine X 2) w Reflex to ID Panel     Status: None (Preliminary result)   Collection Time: 01/21/21  1:17 AM   Specimen: BLOOD  Result Value Ref Range Status   Specimen Description   Final    BLOOD LEFT ANTECUBITAL Performed at [facility], [place] [address], [place], [place] [address]    Special Requests   Final    BOTTLES DRAWN AEROBIC ONLY Blood Culture adequate volume Performed at [facility] [address], [place], [place] [address]    Culture   Final    NO GROWTH 1 DAY Performed at [facility], [place] [address], [place], [place] [address]    Report Status PENDING  Incomplete  Culture, blood (Routine X 2) w Reflex to ID Panel     Status: None (Preliminary result)   Collection Time: 01/21/21  1:17 AM   Specimen: BLOOD LEFT Johns  Result Value Ref Range Status   Specimen Description BLOOD LEFT Raymundo  Final   Special Requests   Final    BOTTLES DRAWN AEROBIC ONLY Blood Culture adequate volume   Culture   Final    NO GROWTH 1 DAY Performed at [facility], [place] [address], [place], [place] [address]    Report Status PENDING  Incomplete     Signed: Marlowe Aschoff Mitzi Lilja  [facility] 01/23/2021, 10:52 AM

## 2021-01-23 NOTE — Telephone Encounter (Signed)
Called back ok orders but we do want him to have some kind of follow up he has been discharged from the Hospital please call and set up virtual with pt to do post [facility] f/u

## 2021-01-24 ENCOUNTER — Ambulatory Visit: Payer: Medicare Other | Admitting: Podiatry

## 2021-01-24 DIAGNOSIS — I6930 Unspecified sequelae of cerebral infarction: Secondary | ICD-10-CM | POA: Diagnosis not present

## 2021-01-24 NOTE — Telephone Encounter (Signed)
Spoke to patient's spouse and scheduled virtual visit for hospital follow up on 2/16.

## 2021-01-25 ENCOUNTER — Telehealth: Payer: Medicare Other | Admitting: Family Medicine

## 2021-01-25 ENCOUNTER — Other Ambulatory Visit: Payer: Self-pay

## 2021-01-25 ENCOUNTER — Ambulatory Visit: Payer: Self-pay | Admitting: *Deleted

## 2021-01-25 NOTE — Telephone Encounter (Signed)
Pt's son Larry Cordova called in.   He decided to take his Dad's BP, not for any particular reason and his dad is not acting different or anything per son.   He got 75/40.  Should I call 911? His mother then got on the phone.   "He is gotten all excited over the BP thing".  Wife told me he is in a hospital bed   He was just in the hospital with a kidney and bladder infection.   He now has a catheter in.   It is draining urine as it normally does.   Her husband is not really coherent.   He is bedridden and has dementia.   "He is not acting or doing any differently than usual".   "I'm not sure why my son decided to check his BP".     It was decided to hold off because of his bedridden status his BP may normally be running low.   They were not sure what his BP normally runs.  His daughter is a Marine scientist and comes by and checks on him every day.    "We didn't call her because she is at work".   "She is coming by after she gets off work".  "She would know if something is different since she knows him".    We agreed on that plan since nothing was changed or out of the ordinary for him.  I let them know I would send this to Dr. Merri Cordova at [facility] on [address].  She thanked me very much for my time and apologized for her son getting all worked up.   I let her know everything was fine.  I sent this to Dr. Carlota Cordova.  He has an appt video with Dr. Carlota Cordova coming up due to his bedridden status he does not go anywhere.  So protocol to be seen not used for that reason.  Reason for Disposition . Wants doctor to measure BP    Bed ridden and asymptomatic  Answer Assessment - Initial Assessment Questions 1. BLOOD PRESSURE: "What is the blood pressure?" "Did you take at least two measurements 5 minutes apart?"     Son calling in.  Larry Cordova   BP 76/44 P-81      BP  90/51  2. ONSET: "When did you take your blood pressure?"     Just now He is in a hospital bed    He is not coherent which is his normal.    He  has a catheter in.   Not able to walk.   He had acute kidney failure.  On antibiotics for a bladder infection. 3. HOW: "How did you obtain the blood pressure?" (e.g., visiting nurse, automatic home BP monitor)     Home BP machine   4. HISTORY: "Do you have a history of low blood pressure?" "What is your blood pressure normally?"     He just came home from hospital and is in a hospital bed.    We are trying to keep him hydrated.    He has an enlarged prostate and ended up into a bladder infection.    He has dementia.  and just sleeps a lot.  5. MEDICATIONS: "Are you taking any medications for blood pressure?" If Yes, ask: "Have they been changed recently?"     No new medicines.  6. PULSE RATE: "Do you know what your pulse rate is?"      81 7. OTHER SYMPTOMS: "Have you been sick  recently?" "Have you had a recent injury?"     Was in hospital with a bladder and kidney infection.  Has a catheter in.  Wife is on phone talking with me now.  8. PREGNANCY: "Is there any chance you are pregnant?" "When was your last menstrual period?"     N/A  Protocols used: BLOOD PRESSURE - LOW-A-AH

## 2021-01-25 NOTE — Telephone Encounter (Signed)
Looks like appt was canceled.

## 2021-01-25 NOTE — Telephone Encounter (Signed)
Fyi to provider.

## 2021-01-25 NOTE — Telephone Encounter (Signed)
Left message on patient's voicemail to request call back to change appointment. Insurance won't pay for a hospital follow up as a virtual visit unless it's a video visit.   If patient knows someone with a cell phone, virtual should be changed to a video visit and patient should come in person today to give urine specimen. Otherwise, appointment should be rescheduled (within 2 week window of discharge from hospital) for an in-person visit with a different provider.

## 2021-01-25 NOTE — Telephone Encounter (Signed)
Called pt to let him know he needed to drop off urine and have daughter be there with emal Internet access.(per Anderson Malta)  To complete an HFU appt scheduled for today.Patients wife asked me to contact daughter Larry Cordova at [phone]  To see if daughter could be present called daughter LVM

## 2021-01-26 LAB — CULTURE, BLOOD (ROUTINE X 2)
Culture: NO GROWTH
Culture: NO GROWTH
Special Requests: ADEQUATE
Special Requests: ADEQUATE

## 2021-01-26 NOTE — Telephone Encounter (Signed)
LM to return my call will try calling again in a bit

## 2021-01-26 NOTE — Telephone Encounter (Signed)
Make sure he is drinking plenty of fluids, if blood pressure is not improving to above 747 systolic today would recommend evaluation either in office or through ER, especially if any lightheadedness, dizziness, or increasing fatigue.

## 2021-01-26 NOTE — Telephone Encounter (Signed)
Called pt at 8:22 am no answer, but I did leave a VM for the pt to call us back ASAP.

## 2021-01-26 NOTE — Telephone Encounter (Addendum)
Please call and check status.  Has blood pressure been rechecked?  Did his daughter stop by last night and obtain different readings? His blood pressure was not low based on his most recent note in the hospital.  If blood pressures are truly measuring in the 90S to 28O systolic, then likely will need to be evaluated through the ER.  Let me know how he is doing.

## 2021-01-26 NOTE — Telephone Encounter (Signed)
Spoke with pt wife, states last checked about 12:30 and was about the same reading as previously.  States he had a shower, has had breakfast this morning, and is not feeling unwell at this time, I requested she recheck BP while I waited that came to 96/54 pulse 70 this is better than yesterday, do you still want him evaluated in ER?

## 2021-01-27 NOTE — Telephone Encounter (Signed)
Called pt To check on BP readings no answer left a VM for the pt to call us back.

## 2021-01-29 DIAGNOSIS — U071 COVID-19: Secondary | ICD-10-CM | POA: Diagnosis not present

## 2021-01-29 DIAGNOSIS — L89151 Pressure ulcer of sacral region, stage 1: Secondary | ICD-10-CM | POA: Diagnosis not present

## 2021-01-29 DIAGNOSIS — L89892 Pressure ulcer of other site, stage 2: Secondary | ICD-10-CM | POA: Diagnosis not present

## 2021-01-29 DIAGNOSIS — G319 Degenerative disease of nervous system, unspecified: Secondary | ICD-10-CM | POA: Diagnosis not present

## 2021-01-30 NOTE — Telephone Encounter (Signed)
FYI to provider. Called pt wife back and she stated that pt is sleeping and will take a BP when he wakes up. I stated that is okay to do.   Pt is currently bed ridden and has a catheter. She stated that she is trying to get home health out to help with the current situation.   She will give [place] a call back with the BP readings.

## 2021-01-31 ENCOUNTER — Telehealth: Payer: Self-pay | Admitting: Family Medicine

## 2021-01-31 NOTE — Telephone Encounter (Signed)
Pt has been released from hospital with 3 wounds-L hip, blister on his calf. She would like verbal orders--- 2 times a week for 2 weeks, once a week for 7 week---home health orders needs a verbal to get going.  Diane is requesting a PT eval to get a Civil Service fast streamer. The hospital is ordering a Education officer, museum for him. Please advise Diane at [phone],

## 2021-01-31 NOTE — Telephone Encounter (Signed)
Called back provided verbal, pt will likely need round the clock care

## 2021-02-01 DIAGNOSIS — U071 COVID-19: Secondary | ICD-10-CM | POA: Diagnosis not present

## 2021-02-01 DIAGNOSIS — L89892 Pressure ulcer of other site, stage 2: Secondary | ICD-10-CM | POA: Diagnosis not present

## 2021-02-01 DIAGNOSIS — L89151 Pressure ulcer of sacral region, stage 1: Secondary | ICD-10-CM | POA: Diagnosis not present

## 2021-02-01 DIAGNOSIS — G319 Degenerative disease of nervous system, unspecified: Secondary | ICD-10-CM | POA: Diagnosis not present

## 2021-02-02 ENCOUNTER — Telehealth: Payer: Self-pay | Admitting: Family Medicine

## 2021-02-02 NOTE — Telephone Encounter (Signed)
02/02/2021 - I RECEIVED A CALL FROM PATIENT'S WIFE ([name]) REQUESTING I CANCEL HIS MY-CHART VIDEO WITH [name] ON Monday (02/06/2021) AT 11:00am. WHEN I ASKED HER WHAT THE CANCEL REASON WAS SHE SAID THEIR DAUGHTER WANTS HIM TO TRY SOMETHING ELSE. I DID CANCEL THE APPOINTMENT BUT I WANT TO COMMUNICATE THIS TO DR. [place]. [place]

## 2021-02-02 NOTE — Telephone Encounter (Signed)
FYI pt canceled Hosp f/u for 02/06/2021

## 2021-02-03 ENCOUNTER — Inpatient Hospital Stay (HOSPITAL_COMMUNITY): Payer: Medicare Other

## 2021-02-03 ENCOUNTER — Inpatient Hospital Stay (HOSPITAL_COMMUNITY)
Admission: EM | Admit: 2021-02-03 | Discharge: 2021-02-10 | DRG: 871 | Disposition: A | Discharge status: Hospice | Payer: Medicare Other | Attending: Internal Medicine | Admitting: Internal Medicine

## 2021-02-03 ENCOUNTER — Emergency Department (HOSPITAL_COMMUNITY): Payer: Medicare Other

## 2021-02-03 ENCOUNTER — Encounter (HOSPITAL_COMMUNITY): Payer: Self-pay | Admitting: Family Medicine

## 2021-02-03 ENCOUNTER — Other Ambulatory Visit: Payer: Self-pay

## 2021-02-03 DIAGNOSIS — Z79899 Other long term (current) drug therapy: Secondary | ICD-10-CM | POA: Diagnosis not present

## 2021-02-03 DIAGNOSIS — R6520 Severe sepsis without septic shock: Secondary | ICD-10-CM | POA: Diagnosis present

## 2021-02-03 DIAGNOSIS — R918 Other nonspecific abnormal finding of lung field: Secondary | ICD-10-CM | POA: Diagnosis not present

## 2021-02-03 DIAGNOSIS — Z743 Need for continuous supervision: Secondary | ICD-10-CM | POA: Diagnosis not present

## 2021-02-03 DIAGNOSIS — R279 Unspecified lack of coordination: Secondary | ICD-10-CM | POA: Diagnosis not present

## 2021-02-03 DIAGNOSIS — L89892 Pressure ulcer of other site, stage 2: Secondary | ICD-10-CM | POA: Diagnosis present

## 2021-02-03 DIAGNOSIS — A0472 Enterocolitis due to Clostridium difficile, not specified as recurrent: Secondary | ICD-10-CM | POA: Diagnosis present

## 2021-02-03 DIAGNOSIS — R338 Other retention of urine: Secondary | ICD-10-CM | POA: Diagnosis present

## 2021-02-03 DIAGNOSIS — R339 Retention of urine, unspecified: Secondary | ICD-10-CM | POA: Diagnosis not present

## 2021-02-03 DIAGNOSIS — Z515 Encounter for palliative care: Secondary | ICD-10-CM | POA: Diagnosis not present

## 2021-02-03 DIAGNOSIS — F0391 Unspecified dementia with behavioral disturbance: Secondary | ICD-10-CM | POA: Diagnosis not present

## 2021-02-03 DIAGNOSIS — U071 COVID-19: Secondary | ICD-10-CM | POA: Diagnosis present

## 2021-02-03 DIAGNOSIS — L89151 Pressure ulcer of sacral region, stage 1: Secondary | ICD-10-CM | POA: Diagnosis not present

## 2021-02-03 DIAGNOSIS — L89222 Pressure ulcer of left hip, stage 2: Secondary | ICD-10-CM | POA: Diagnosis present

## 2021-02-03 DIAGNOSIS — A414 Sepsis due to anaerobes: Secondary | ICD-10-CM | POA: Diagnosis not present

## 2021-02-03 DIAGNOSIS — L8990 Pressure ulcer of unspecified site, unspecified stage: Secondary | ICD-10-CM | POA: Insufficient documentation

## 2021-02-03 DIAGNOSIS — R609 Edema, unspecified: Secondary | ICD-10-CM

## 2021-02-03 DIAGNOSIS — J159 Unspecified bacterial pneumonia: Secondary | ICD-10-CM | POA: Diagnosis present

## 2021-02-03 DIAGNOSIS — Z7401 Bed confinement status: Secondary | ICD-10-CM

## 2021-02-03 DIAGNOSIS — Z7189 Other specified counseling: Secondary | ICD-10-CM | POA: Diagnosis not present

## 2021-02-03 DIAGNOSIS — L89210 Pressure ulcer of right hip, unstageable: Secondary | ICD-10-CM | POA: Diagnosis not present

## 2021-02-03 DIAGNOSIS — I2699 Other pulmonary embolism without acute cor pulmonale: Secondary | ICD-10-CM | POA: Diagnosis present

## 2021-02-03 DIAGNOSIS — Z79890 Hormone replacement therapy (postmenopausal): Secondary | ICD-10-CM | POA: Diagnosis not present

## 2021-02-03 DIAGNOSIS — G9340 Encephalopathy, unspecified: Secondary | ICD-10-CM | POA: Diagnosis not present

## 2021-02-03 DIAGNOSIS — Z66 Do not resuscitate: Secondary | ICD-10-CM | POA: Diagnosis present

## 2021-02-03 DIAGNOSIS — I808 Phlebitis and thrombophlebitis of other sites: Secondary | ICD-10-CM | POA: Diagnosis present

## 2021-02-03 DIAGNOSIS — F03918 Unspecified dementia, unspecified severity, with other behavioral disturbance: Secondary | ICD-10-CM | POA: Diagnosis present

## 2021-02-03 DIAGNOSIS — Z87891 Personal history of nicotine dependence: Secondary | ICD-10-CM | POA: Diagnosis not present

## 2021-02-03 DIAGNOSIS — Z8616 Personal history of COVID-19: Secondary | ICD-10-CM | POA: Diagnosis not present

## 2021-02-03 DIAGNOSIS — Y95 Nosocomial condition: Secondary | ICD-10-CM | POA: Diagnosis present

## 2021-02-03 DIAGNOSIS — I10 Essential (primary) hypertension: Secondary | ICD-10-CM | POA: Diagnosis not present

## 2021-02-03 DIAGNOSIS — R531 Weakness: Secondary | ICD-10-CM | POA: Diagnosis not present

## 2021-02-03 DIAGNOSIS — E039 Hypothyroidism, unspecified: Secondary | ICD-10-CM | POA: Diagnosis present

## 2021-02-03 DIAGNOSIS — R0602 Shortness of breath: Secondary | ICD-10-CM

## 2021-02-03 DIAGNOSIS — R0902 Hypoxemia: Secondary | ICD-10-CM | POA: Diagnosis not present

## 2021-02-03 DIAGNOSIS — A419 Sepsis, unspecified organism: Secondary | ICD-10-CM | POA: Diagnosis not present

## 2021-02-03 DIAGNOSIS — I700 Atherosclerosis of aorta: Secondary | ICD-10-CM | POA: Diagnosis not present

## 2021-02-03 LAB — COMPREHENSIVE METABOLIC PANEL
ALT: 25 U/L (ref 0–44)
AST: 35 U/L (ref 15–41)
Albumin: 2.6 g/dL — ABNORMAL LOW (ref 3.5–5.0)
Alkaline Phosphatase: 93 U/L (ref 38–126)
Anion gap: 12 (ref 5–15)
BUN: 14 mg/dL (ref 8–23)
CO2: 26 mmol/L (ref 22–32)
Calcium: 8.3 mg/dL — ABNORMAL LOW (ref 8.9–10.3)
Chloride: 100 mmol/L (ref 98–111)
Creatinine, Ser: 0.88 mg/dL (ref 0.61–1.24)
GFR, Estimated: >60 mL/min (ref >60)
Glucose, Bld: 93 mg/dL (ref 70–99)
Potassium: 3.9 mmol/L (ref 3.5–5.1)
Sodium: 138 mmol/L (ref 135–145)
Total Bilirubin: 1.1 mg/dL (ref 0.3–1.2)
Total Protein: 6.5 g/dL (ref 6.5–8.1)

## 2021-02-03 LAB — URINALYSIS, ROUTINE W REFLEX MICROSCOPIC
Bacteria, UA: NONE SEEN
Bilirubin Urine: NEGATIVE
Glucose, UA: NEGATIVE mg/dL
Ketones, ur: 5 mg/dL — AB
Nitrite: NEGATIVE
Protein, ur: 100 mg/dL — AB
RBC / HPF: >50 RBC/hpf — ABNORMAL HIGH (ref 0–5)
Specific Gravity, Urine: 1.024 (ref 1.005–1.030)
pH: 5 (ref 5.0–8.0)

## 2021-02-03 LAB — PROTIME-INR
INR: 1.1 (ref 0.8–1.2)
Prothrombin Time: 14 seconds (ref 11.4–15.2)

## 2021-02-03 LAB — APTT: aPTT: 33 seconds (ref 24–36)

## 2021-02-03 LAB — D-DIMER, QUANTITATIVE: D-Dimer, Quant: 4.83 ug/mL-FEU — ABNORMAL HIGH (ref 0.00–0.50)

## 2021-02-03 LAB — CBC WITH DIFFERENTIAL/PLATELET
Abs Immature Granulocytes: 0.19 10*3/uL — ABNORMAL HIGH (ref 0.00–0.07)
Basophils Absolute: 0.1 10*3/uL (ref 0.0–0.1)
Basophils Relative: 0 %
Eosinophils Absolute: 0.1 10*3/uL (ref 0.0–0.5)
Eosinophils Relative: 0 %
HCT: 39.7 % (ref 39.0–52.0)
Hemoglobin: 13 g/dL (ref 13.0–17.0)
Immature Granulocytes: 1 %
Lymphocytes Relative: 6 %
Lymphs Abs: 1.4 10*3/uL (ref 0.7–4.0)
MCH: 31 pg (ref 26.0–34.0)
MCHC: 32.7 g/dL (ref 30.0–36.0)
MCV: 94.5 fL (ref 80.0–100.0)
Monocytes Absolute: 2 10*3/uL — ABNORMAL HIGH (ref 0.1–1.0)
Monocytes Relative: 8 %
Neutro Abs: 20.5 10*3/uL — ABNORMAL HIGH (ref 1.7–7.7)
Neutrophils Relative %: 85 %
Platelets: 295 10*3/uL (ref 150–400)
RBC: 4.2 MIL/uL — ABNORMAL LOW (ref 4.22–5.81)
RDW: 13.3 % (ref 11.5–15.5)
WBC: 24.2 10*3/uL — ABNORMAL HIGH (ref 4.0–10.5)
nRBC: 0 % (ref 0.0–0.2)

## 2021-02-03 LAB — TRIGLYCERIDES: Triglycerides: 54 mg/dL (ref <150)

## 2021-02-03 LAB — LACTIC ACID, PLASMA
Lactic Acid, Venous: 2.1 mmol/L (ref 0.5–1.9)
Lactic Acid, Venous: 2.1 mmol/L (ref 0.5–1.9)
Lactic Acid, Venous: 2.1 mmol/L (ref 0.5–1.9)
Lactic Acid, Venous: 1.5 mmol/L (ref 0.5–1.9)
Lactic Acid, Venous: 1.6 mmol/L (ref 0.5–1.9)

## 2021-02-03 LAB — MRSA PCR SCREENING: MRSA by PCR: NEGATIVE

## 2021-02-03 LAB — FERRITIN: Ferritin: 282 ng/mL (ref 24–336)

## 2021-02-03 LAB — C DIFFICILE QUICK SCREEN W PCR REFLEX
C Diff antigen: POSITIVE — AB
C Diff interpretation: DETECTED
C Diff toxin: POSITIVE — AB

## 2021-02-03 LAB — FIBRINOGEN: Fibrinogen: 423 mg/dL (ref 210–475)

## 2021-02-03 LAB — HEPARIN LEVEL (UNFRACTIONATED): Heparin Unfractionated: 0.19 IU/mL — ABNORMAL LOW (ref 0.30–0.70)

## 2021-02-03 LAB — PROCALCITONIN: Procalcitonin: 0.19 ng/mL

## 2021-02-03 LAB — C-REACTIVE PROTEIN: CRP: 9 mg/dL — ABNORMAL HIGH (ref <1.0)

## 2021-02-03 LAB — BRAIN NATRIURETIC PEPTIDE: B Natriuretic Peptide: 130.5 pg/mL — ABNORMAL HIGH (ref 0.0–100.0)

## 2021-02-03 LAB — LACTATE DEHYDROGENASE: LDH: 169 U/L (ref 98–192)

## 2021-02-03 MED ORDER — ACETAMINOPHEN 650 MG RE SUPP: 650.0000 mg | Freq: Four times a day (QID) | RECTAL | Status: DC | PRN

## 2021-02-03 MED ORDER — VANCOMYCIN 50 MG/ML ORAL SOLUTION
500.0000 mg | Freq: Four times a day (QID) | ORAL | Status: DC
  Filled 2021-02-03: qty 10

## 2021-02-03 MED ORDER — ENSURE ENLIVE PO LIQD
237.0000 mL | Freq: Two times a day (BID) | ORAL | Status: DC
  Administered 2021-02-05 – 2021-02-08 (×8): 237 mL via ORAL

## 2021-02-03 MED ORDER — LACTATED RINGERS IV BOLUS (SEPSIS): 500.0000 mL | Freq: Once | INTRAVENOUS | Status: DC

## 2021-02-03 MED ORDER — IPRATROPIUM-ALBUTEROL 20-100 MCG/ACT IN AERS
1.0000 | INHALATION_SPRAY | Freq: Four times a day (QID) | RESPIRATORY_TRACT | Status: DC
  Administered 2021-02-03 – 2021-02-08 (×13): 1 via RESPIRATORY_TRACT
  Filled 2021-02-03: qty 4

## 2021-02-03 MED ORDER — SENNOSIDES-DOCUSATE SODIUM 8.6-50 MG PO TABS: 1.0000 | ORAL_TABLET | Freq: Every evening | ORAL | Status: DC | PRN

## 2021-02-03 MED ORDER — CHLORHEXIDINE GLUCONATE CLOTH 2 % EX PADS
6.0000 | MEDICATED_PAD | Freq: Every day | CUTANEOUS | Status: DC
  Administered 2021-02-03 – 2021-02-10 (×7): 6 via TOPICAL

## 2021-02-03 MED ORDER — GUAIFENESIN-DM 100-10 MG/5ML PO SYRP
10.0000 mL | ORAL_SOLUTION | ORAL | Status: DC | PRN
  Filled 2021-02-03: qty 10

## 2021-02-03 MED ORDER — LACTATED RINGERS IV BOLUS (SEPSIS): 1000.0000 mL | Freq: Once | INTRAVENOUS | Status: DC

## 2021-02-03 MED ORDER — SODIUM CHLORIDE 0.9 % IV SOLN
2.0000 g | INTRAVENOUS | Status: DC
  Administered 2021-02-03: 2 g via INTRAVENOUS
  Filled 2021-02-03: qty 20

## 2021-02-03 MED ORDER — VANCOMYCIN HCL IN DEXTROSE 1-5 GM/200ML-% IV SOLN: 1000.0000 mg | Freq: Once | INTRAVENOUS | Status: DC

## 2021-02-03 MED ORDER — ACETAMINOPHEN 325 MG PO TABS
650.0000 mg | ORAL_TABLET | Freq: Four times a day (QID) | ORAL | Status: DC | PRN
  Administered 2021-02-09 – 2021-02-10 (×2): 650 mg via ORAL
  Filled 2021-02-03 (×2): qty 2

## 2021-02-03 MED ORDER — LACTATED RINGERS IV SOLN: INTRAVENOUS | Status: DC

## 2021-02-03 MED ORDER — ENOXAPARIN SODIUM 40 MG/0.4ML ~~LOC~~ SOLN
40.0000 mg | SUBCUTANEOUS | Status: DC
  Administered 2021-02-03: 40 mg via SUBCUTANEOUS
  Filled 2021-02-03: qty 0.4

## 2021-02-03 MED ORDER — ONDANSETRON HCL 4 MG/2ML IJ SOLN: 4.0000 mg | Freq: Four times a day (QID) | INTRAMUSCULAR | Status: DC | PRN

## 2021-02-03 MED ORDER — LACTATED RINGERS IV BOLUS (SEPSIS)
1000.0000 mL | Freq: Once | INTRAVENOUS | Status: AC
  Administered 2021-02-03: 1000 mL via INTRAVENOUS

## 2021-02-03 MED ORDER — CHLORHEXIDINE GLUCONATE 0.12 % MT SOLN
15.0000 mL | Freq: Two times a day (BID) | OROMUCOSAL | Status: DC
  Administered 2021-02-03 – 2021-02-10 (×15): 15 mL via OROMUCOSAL
  Filled 2021-02-03 (×16): qty 15

## 2021-02-03 MED ORDER — SODIUM CHLORIDE 0.9 % IV SOLN: 2.0000 g | Freq: Once | INTRAVENOUS | Status: DC

## 2021-02-03 MED ORDER — IOHEXOL 350 MG/ML SOLN
80.0000 mL | Freq: Once | INTRAVENOUS | Status: AC | PRN
  Administered 2021-02-03: 80 mL via INTRAVENOUS

## 2021-02-03 MED ORDER — ACETAMINOPHEN 650 MG RE SUPP
650.0000 mg | Freq: Once | RECTAL | Status: AC
  Administered 2021-02-03: 650 mg via RECTAL
  Filled 2021-02-03: qty 1

## 2021-02-03 MED ORDER — TRAZODONE HCL 50 MG PO TABS
50.0000 mg | ORAL_TABLET | Freq: Every day | ORAL | Status: DC
  Administered 2021-02-04 – 2021-02-09 (×6): 50 mg via ORAL
  Filled 2021-02-03 (×7): qty 1

## 2021-02-03 MED ORDER — LEVOTHYROXINE SODIUM 112 MCG PO TABS
112.0000 ug | ORAL_TABLET | Freq: Every day | ORAL | Status: DC
  Filled 2021-02-03: qty 1

## 2021-02-03 MED ORDER — ONDANSETRON HCL 4 MG PO TABS: 4.0000 mg | ORAL_TABLET | Freq: Four times a day (QID) | ORAL | Status: DC | PRN

## 2021-02-03 MED ORDER — ZINC SULFATE 220 (50 ZN) MG PO CAPS
220.0000 mg | ORAL_CAPSULE | Freq: Every day | ORAL | Status: DC
  Administered 2021-02-05 – 2021-02-09 (×5): 220 mg via ORAL
  Filled 2021-02-03 (×5): qty 1

## 2021-02-03 MED ORDER — ORAL CARE MOUTH RINSE
15.0000 mL | Freq: Two times a day (BID) | OROMUCOSAL | Status: DC
  Administered 2021-02-03 – 2021-02-10 (×14): 15 mL via OROMUCOSAL

## 2021-02-03 MED ORDER — PROSOURCE PLUS PO LIQD
30.0000 mL | Freq: Two times a day (BID) | ORAL | Status: DC
  Administered 2021-02-05 – 2021-02-10 (×8): 30 mL via ORAL
  Filled 2021-02-03 (×11): qty 30

## 2021-02-03 MED ORDER — ASCORBIC ACID 500 MG PO TABS
500.0000 mg | ORAL_TABLET | Freq: Every day | ORAL | Status: DC
  Administered 2021-02-05 – 2021-02-09 (×5): 500 mg via ORAL
  Filled 2021-02-03 (×5): qty 1

## 2021-02-03 MED ORDER — HEPARIN (PORCINE) 25000 UT/250ML-% IV SOLN
1500.0000 [IU]/h | INTRAVENOUS | Status: DC
  Administered 2021-02-03: 1300 [IU]/h via INTRAVENOUS
  Filled 2021-02-03 (×2): qty 250

## 2021-02-03 MED ORDER — VANCOMYCIN HCL 1500 MG/300ML IV SOLN
1500.0000 mg | INTRAVENOUS | Status: DC
  Administered 2021-02-03: 1500 mg via INTRAVENOUS
  Filled 2021-02-03: qty 300

## 2021-02-03 MED ORDER — HEPARIN BOLUS VIA INFUSION
2000.0000 [IU] | Freq: Once | INTRAVENOUS | Status: AC
  Administered 2021-02-03: 2000 [IU] via INTRAVENOUS
  Filled 2021-02-03: qty 2000

## 2021-02-03 MED ORDER — VANCOMYCIN HCL 500 MG IV SOLR
500.0000 mg | Freq: Four times a day (QID) | Status: DC
  Administered 2021-02-04 – 2021-02-07 (×12): 500 mg via RECTAL
  Filled 2021-02-03 (×18): qty 500

## 2021-02-03 MED ORDER — SODIUM CHLORIDE 0.9 % IV SOLN
2.0000 g | Freq: Three times a day (TID) | INTRAVENOUS | Status: DC
  Administered 2021-02-03 – 2021-02-08 (×16): 2 g via INTRAVENOUS
  Filled 2021-02-03 (×18): qty 2

## 2021-02-03 MED ORDER — TAMSULOSIN HCL 0.4 MG PO CAPS
0.4000 mg | ORAL_CAPSULE | Freq: Every day | ORAL | Status: DC
  Administered 2021-02-03: 0.4 mg via ORAL
  Filled 2021-02-03: qty 1

## 2021-02-03 MED ORDER — SODIUM CHLORIDE 0.9 % IV SOLN
500.0000 mg | INTRAVENOUS | Status: DC
  Administered 2021-02-03: 500 mg via INTRAVENOUS
  Filled 2021-02-03: qty 500

## 2021-02-03 MED ORDER — DIVALPROEX SODIUM ER 500 MG PO TB24
500.0000 mg | ORAL_TABLET | Freq: Every day | ORAL | Status: DC
  Administered 2021-02-04 – 2021-02-09 (×6): 500 mg via ORAL
  Filled 2021-02-03: qty 2
  Filled 2021-02-03 (×2): qty 1
  Filled 2021-02-03: qty 2
  Filled 2021-02-03: qty 1
  Filled 2021-02-03: qty 2
  Filled 2021-02-03: qty 1

## 2021-02-03 MED ORDER — ADULT MULTIVITAMIN LIQUID CH: 15.0000 mL | Freq: Every day | ORAL | Status: DC

## 2021-02-03 MED ORDER — METRONIDAZOLE IN NACL 5-0.79 MG/ML-% IV SOLN
500.0000 mg | Freq: Three times a day (TID) | INTRAVENOUS | Status: DC
  Administered 2021-02-03 – 2021-02-07 (×12): 500 mg via INTRAVENOUS
  Filled 2021-02-03 (×12): qty 100

## 2021-02-03 MED ORDER — HYDROCOD POLST-CPM POLST ER 10-8 MG/5ML PO SUER: 5.0000 mL | Freq: Two times a day (BID) | ORAL | Status: DC | PRN

## 2021-02-03 NOTE — H&P (Signed)
History and Physical    Larry R Walthall GHW:[id number] DOB: 11-30-1930 DOA: 02/03/2021  PCP: [name], MD   Patient coming from: home   Chief Complaint: Foley catheter problem   HPI: Larry Cordova is a 87 y.o. male with medical history significant for advanced dementia with behavioral disturbance, hypertension, hypothyroidism, urinary retention with Foley catheter, COVID-19 diagnosed on 01/18/2021, and recent hospitalization with acute renal failure and staph epidermidis bacteremia, now presenting to the emergency department for evaluation of Foley catheter problem.  The patient was brought in by his wife after he appeared to have pulled his Foley catheter part of the way out.  Family noted some blood in the catheter.  Patient is unable to contribute to the history due to his dementia and family is not able to be reached at time of admission.  ED Course: Upon arrival to the ED, patient is found to be in rigors with temperature 39.4, mild tachypnea and tachycardia, and hypotension.  EKG features ectopic atrial or sinus tachycardia.  Chest x-ray demonstrates increased regions of patchy and ill-defined opacity.  Chemistry panel with albumin of 2.6.  CBC notable for leukocytosis of 24,200.  Lactic acid 2.1.  Blood and urine culture were collected in the emergency department and the patient was given 30 cc/kg LR, acetaminophen, vancomycin, cefepime, and Flagyl.  Review of Systems:  All other systems reviewed and apart from HPI, are negative.  Past Medical History:  Diagnosis Date  . Allergy   . Arthritis   . Depression   . Hypertension   . Kyphosis   . Spondylolisthesis 03/18/2017  . Thyroid disease   . Tinnitus     Past Surgical History:  Procedure Laterality Date  . HERNIA REPAIR      Social History:   reports that he has quit smoking. His smoking use included cigarettes, pipe, and cigars. He quit after 30.00 years of use. He has never used smokeless tobacco. He reports that he  does not drink alcohol and does not use drugs.  No Known Allergies  Family History  Problem Relation Age of Onset  . Kidney failure Mother   . Dementia Neg Hx      Prior to Admission medications   Medication Sig Start Date End Date Taking? Authorizing Provider  divalproex (DEPAKOTE ER) 250 MG 24 hr tablet TAKE 2 TABLETS(500 MG) BY MOUTH AT BEDTIME 08/17/20   Lomax, Amy, NP  feeding supplement (ENSURE ENLIVE / ENSURE PLUS) LIQD Take 237 mLs by mouth 2 (two) times daily between meals. 01/23/21   Dahal, Marlowe Aschoff, MD  guaiFENesin-dextromethorphan (ROBITUSSIN DM) 100-10 MG/5ML syrup Take 10 mLs by mouth every 4 (four) hours as needed for cough. 01/23/21   Terrilee Croak, MD  levothyroxine (SYNTHROID) 112 MCG tablet TAKE 1 TABLET(112 MCG) BY MOUTH DAILY 04/01/20   [name], MD  Nutritional Supplements (,FEEDING SUPPLEMENT, PROSOURCE PLUS) liquid Take 30 mLs by mouth daily. 01/23/21 04/23/21  Terrilee Croak, MD  tamsulosin (FLOMAX) 0.4 MG CAPS capsule Take 1 capsule (0.4 mg total) by mouth daily. 01/24/21 04/24/21  Terrilee Croak, MD  traZODone (DESYREL) 50 MG tablet Take 1 tablet (50 mg total) by mouth at bedtime. 01/23/21 04/23/21  Terrilee Croak, MD    Physical Exam: Vitals:   02/03/21 0430 02/03/21 0450 02/03/21 0500 02/03/21 0600  BP: (!) 75/48  (!) 64/46 126/62  Pulse: (!) 103  (!) 103 (!) 103  Resp: (!) 23  13 19   Temp:  (!) 103 F (39.4 C)  TempSrc:  Rectal    SpO2: 97%  95% 94%  Weight:      Height:         Constitutional: NAD, calm  Eyes: PERTLA, lids and conjunctivae normal ENMT: Mucous membranes are moist. Posterior pharynx clear of any exudate or lesions.   Neck: normal, supple, no masses, no thyromegaly Respiratory: Mild tachypnea, no wheezing. No accessory muscle use.  Cardiovascular: Rate ~100 and regular. No extremity edema.  Abdomen: No distension, no tenderness, soft. Bowel sounds active.  Musculoskeletal: no clubbing / cyanosis. No joint deformity upper and lower  extremities.   Skin: no significant rashes. Bandage over right achilles. Warm, dry, well-perfused. Neurologic: No gross facial asymmetry. Sensation intact. Moving all extremities.  Psychiatric: Alert, no intelligible speech. Calm.     Labs and Imaging on Admission: I have personally reviewed following labs and imaging studies  CBC: Recent Labs  Lab 02/03/21 0155  WBC 24.2*  NEUTROABS 20.5*  HGB 13.0  HCT 39.7  MCV 94.5  PLT 384   Basic Metabolic Panel: Recent Labs  Lab 02/03/21 0155  NA 138  K 3.9  CL 100  CO2 26  GLUCOSE 93  BUN 14  CREATININE 0.88  CALCIUM 8.3*   GFR: Estimated Creatinine Clearance: 63.4 mL/min (by C-G formula based on SCr of 0.88 mg/dL). Liver Function Tests: Recent Labs  Lab 02/03/21 0155  AST 35  ALT 25  ALKPHOS 93  BILITOT 1.1  PROT 6.5  ALBUMIN 2.6*   No results for input(s): LIPASE, AMYLASE in the last 168 hours. No results for input(s): AMMONIA in the last 168 hours. Coagulation Profile: Recent Labs  Lab 02/03/21 0155  INR 1.1   Cardiac Enzymes: No results for input(s): CKTOTAL, CKMB, CKMBINDEX, TROPONINI in the last 168 hours. BNP (last 3 results) No results for input(s): PROBNP in the last 8760 hours. HbA1C: No results for input(s): HGBA1C in the last 72 hours. CBG: No results for input(s): GLUCAP in the last 168 hours. Lipid Profile: No results for input(s): CHOL, HDL, LDLCALC, TRIG, CHOLHDL, LDLDIRECT in the last 72 hours. Thyroid Function Tests: No results for input(s): TSH, T4TOTAL, FREET4, T3FREE, THYROIDAB in the last 72 hours. Anemia Panel: No results for input(s): VITAMINB12, FOLATE, FERRITIN, TIBC, IRON, RETICCTPCT in the last 72 hours. Urine analysis:    Component Value Date/Time   COLORURINE AMBER (A) 02/03/2021 0300   APPEARANCEUR CLOUDY (A) 02/03/2021 0300   LABSPEC 1.024 02/03/2021 0300   PHURINE 5.0 02/03/2021 0300   GLUCOSEU NEGATIVE 02/03/2021 0300   HGBUR LARGE (A) 02/03/2021 0300   BILIRUBINUR  NEGATIVE 02/03/2021 0300   BILIRUBINUR negative 12/07/2016 1850   BILIRUBINUR neg 07/29/2014 0906   KETONESUR 5 (A) 02/03/2021 0300   PROTEINUR 100 (A) 02/03/2021 0300   UROBILINOGEN 0.2 12/07/2016 1850   NITRITE NEGATIVE 02/03/2021 0300   LEUKOCYTESUR TRACE (A) 02/03/2021 0300   Sepsis Labs: @LABRCNTIP (procalcitonin:4,lacticidven:4) )No results found for this or any previous visit (from the past 240 hour(s)).   Radiological Exams on Admission: DG Chest Port 1 View  Result Date: 02/03/2021 CLINICAL DATA:  Possible sepsis EXAM: PORTABLE CHEST 1 VIEW COMPARISON:  01/18/2021 FINDINGS: Increasing regions of patchy and ill-defined opacity seen in the mid lungs and left lung base. No pneumothorax. No visible pleural effusion. Stable cardiomediastinal contours accounting for differences in technique and inflation with a calcified, tortuous aorta. No acute osseous or soft tissue abnormality. Degenerative changes are present in the imaged spine and shoulders. Telemetry leads overlie the chest. IMPRESSION: Increasing  regions of patchy and ill-defined opacity in the mid lungs and left lung base, could reflect developing airspace disease and/or atelectasis appropriate clinical setting Electronically Signed   By: Lovena Le M.D.   On: 02/03/2021 02:16    EKG: Independently reviewed. Sinus or ectopic atrial tachycardia, rate 114, RBBB, LPFB, similar to prior.   Assessment/Plan   1. Sepsis  - Presents from home after pulling his Foley catheter out and is found to have rigors, fever, tachycardia, hypotension, marked leukocytosis, and mild elevation in lactate  - CXR findings could reflect multifocal bacterial pneumonia or may be related to recent covid infection; UA compatible with possible infection; recently completed treatment for staph bacteremia and had negative repeat cultures on 2/12 and no vegetation on TTE  - Blood and urine culture collected in ED, 30 cc/kg LR given, and broad-spectrum empiric  antibiotics started  - BP has normalized with IV fluid resuscitation  - Continue empiric antibiotics, trend lactate, follow cultures and clinical course    2. COVID-19  - Diagnosed 01/18/21  - Plan to continue isolation through 02/08/21   3. Dementia  - Continue Depakote qHS, supportive care with delirium precautions    4. Hypothyroidism  - Continue Synthroid    5. Urinary retention  - Continue Flomax, Foley care     DVT prophylaxis: Lovenox  Code Status: Full  Level of Care: Level of care: Stepdown Family Communication: Unable to reach family at time of admission  Disposition Plan:  Patient is from: Home  Anticipated d/c is to: TBD Anticipated d/c date is: 02/07/21 Patient currently: pending cultures, improvement in sepsis parameters  Consults called: None  Admission status: Inpatient     Vianne Bulls, MD [facility]  02/03/2021, 6:13 AM

## 2021-02-03 NOTE — Progress Notes (Signed)
Pharmacy Antibiotic Note  Larry Cordova is a 87 y.o. male admitted on 02/03/2021 with history of severe dementia. Family sent pt to ED initially concerned a bout a problem with his foley catheter.  Pt noted to be exhibiting rigors at arrival and had temp of 102.7 .  Pharmacy has been consulted to dose vancomycin and cefepime for sepsis.  Plan: Vancomycin 1500mg  x 1 then 1500 q24h (AUC 472.4, Scr 0.88) Cefepime 2gm IV q8h Follow renal function, cultures and clinical course  Height: 6\' 2"  (188 cm) Weight: 77.3 kg (170 lb 6.7 oz) IBW/kg (Calculated) : 82.2  Temp (24hrs), Avg:102.9 F (39.4 C), Min:102.7 F (39.3 C), Max:103 F (39.4 C)  Recent Labs  Lab 02/03/21 0155 02/03/21 0158 02/03/21 0415  WBC 24.2*  --   --   CREATININE 0.88  --   --   LATICACIDVEN  --  2.1* 2.1*    Estimated Creatinine Clearance: 63.4 mL/min (by C-G formula based on SCr of 0.88 mg/dL).    No Known Allergies  Dolly Rias RPh 02/03/2021, 5:24 AM

## 2021-02-03 NOTE — Progress Notes (Signed)
PROGRESS NOTE  Larry Cordova DOB: 17-Sep-1930 DOA: 02/03/2021 PCP: [name], MD  HPI/Recap of past 24 hours: HPI from Dr Larry Cordova is a 87 y.o. male with medical history significant for advanced dementia with behavioral disturbance, HTN, hypothyroidism, urinary retention with Foley catheter, COVID-19 diagnosed on 01/18/2021, and recent hospitalization with acute renal failure and staph epidermidis bacteremia, now presenting to the ED for evaluation of Foley catheter problem. The patient was brought in by his wife after he appeared to have pulled his Foley catheter part of the way out.  Family noted some blood in the catheter.  Patient is unable to contribute to the history due to his dementia. Discussed with wife who noted that patient has been more confused than his baseline, with poor appetite and has been having diarrhea since his discharge from hospital.  In the ED, patient is found to be in rigors with temperature 102.7, tachypnea, tachycardia, and hypotension. Chest x-ray demonstrates increased regions of patchy and ill-defined opacity. CBC notable for leukocytosis of 24,200.  Lactic acid 2.1.  Blood and urine culture were collected. Patient was given 30 cc/kg LR, acetaminophen, vancomycin, cefepime, and Flagyl.  Patient admitted for further management.    Today, patient with muffled speech/nonverbal, unable to vocalize his needs.  Resting in bed, easily arousable, acutely ill-appearing.    Assessment/Plan: Principal Problem:   Sepsis ([place]) Active Problems:   Hypothyroidism   HTN (hypertension)   Dementia with behavioral disturbance (HCC)   Acute urinary retention   COVID-19 virus infection   Pressure injury of skin   Sepsis 2/2 multifocal pneumonia/HCAP On presentation, noted to have fever, tachycardia, hypotension, marked leukocytosis, and mild elevation in lactate  UA with large hemoglobin, trace leukocytes, 11-20 WBC UC and BC x2  pending Lactic acid trended down Procalcitonin 0.19, will trend CXR possible multifocal pneumonia or may be related to recent covid infection CTA chest done showed multifocal pneumonia Continue cefepime, vancomycin, Flagyl Continue IV fluids Monitor closely  COVID-19 infection Diagnosed 01/18/21  Completed remdesivir during last admission Currently saturating well on room air, no indication yet to start steroids Plan to continue isolation through 02/08/21  Monitor closely for change in oxygen requirement  Acute pulmonary embolism Small age indeterminant PE in the right middle lobe and left upper lobe on CTA chest Bilateral Doppler lower extremity negative for DVT Start heparin drip, plan to switch to DOAC once able  Diarrhea Noted watery stool C. difficile, GI panel pending Monitor closely  Dementia  Continue Depakote qHS Supportive care with delirium precautions    Hypothyroidism  Continue Synthroid    Urinary retention  Continue Flomax, Foley care      Estimated body mass index is 21.88 kg/m as calculated from the following:   Height as of this encounter: 6\' 2"  (1.88 m).   Weight as of this encounter: 77.3 kg.     Code Status: DNR, discussed extensively with wife on 02/03/2021  Family Communication: Discussed with wife on 02/03/2021  Disposition Plan: Status is: Inpatient  Remains inpatient appropriate because:Inpatient level of care appropriate due to severity of illness   Dispo: The patient is from: Home              Anticipated d/c is to: Home              Patient currently is not medically stable to d/c.   Difficult to place patient No    Consultants:  None  Procedures:  None  Antimicrobials:  Cefepime  Vancomycin  Flagyl  DVT prophylaxis: Heparin drip   Objective: Vitals:   02/03/21 0900 02/03/21 1000 02/03/21 1100 02/03/21 1200  BP: (!) 103/50  (!) 116/54 (!) 111/46  Pulse: (!) 101 98 99 100  Resp: (!) 23 (!) 26 (!) 23 (!) 24   Temp:    98.3 F (36.8 C)  TempSrc:    Axillary  SpO2: 92% 93% 93% 95%  Weight:      Height:        Intake/Output Summary (Last 24 hours) at 02/03/2021 1252 Last data filed at 02/03/2021 1207 Gross per 24 hour  Intake 977.07 ml  Output --  Net 977.07 ml   Filed Weights   02/03/21 0129  Weight: 77.3 kg    Exam:  General: Acutely ill-appearing, muffled speech, alert, awake, not oriented  Cardiovascular: S1, S2 present  Respiratory: CTAB  Abdomen: Soft, nontender, nondistended, bowel sounds present  Musculoskeletal: No bilateral pedal edema noted  Skin: Normal  Psychiatry:  Unable to assess   Data Reviewed: CBC: Recent Labs  Lab 02/03/21 0155  WBC 24.2*  NEUTROABS 20.5*  HGB 13.0  HCT 39.7  MCV 94.5  PLT 875   Basic Metabolic Panel: Recent Labs  Lab 02/03/21 0155  NA 138  K 3.9  CL 100  CO2 26  GLUCOSE 93  BUN 14  CREATININE 0.88  CALCIUM 8.3*   GFR: Estimated Creatinine Clearance: 63.4 mL/min (by C-G formula based on SCr of 0.88 mg/dL). Liver Function Tests: Recent Labs  Lab 02/03/21 0155  AST 35  ALT 25  ALKPHOS 93  BILITOT 1.1  PROT 6.5  ALBUMIN 2.6*   No results for input(s): LIPASE, AMYLASE in the last 168 hours. No results for input(s): AMMONIA in the last 168 hours. Coagulation Profile: Recent Labs  Lab 02/03/21 0155  INR 1.1   Cardiac Enzymes: No results for input(s): CKTOTAL, CKMB, CKMBINDEX, TROPONINI in the last 168 hours. BNP (last 3 results) No results for input(s): PROBNP in the last 8760 hours. HbA1C: No results for input(s): HGBA1C in the last 72 hours. CBG: No results for input(s): GLUCAP in the last 168 hours. Lipid Profile: Recent Labs    02/03/21 0527  TRIG 54   Thyroid Function Tests: No results for input(s): TSH, T4TOTAL, FREET4, T3FREE, THYROIDAB in the last 72 hours. Anemia Panel: Recent Labs    02/03/21 0527  FERRITIN 282   Urine analysis:    Component Value Date/Time   COLORURINE AMBER  (A) 02/03/2021 0300   APPEARANCEUR CLOUDY (A) 02/03/2021 0300   LABSPEC 1.024 02/03/2021 0300   PHURINE 5.0 02/03/2021 0300   GLUCOSEU NEGATIVE 02/03/2021 0300   HGBUR LARGE (A) 02/03/2021 0300   BILIRUBINUR NEGATIVE 02/03/2021 0300   BILIRUBINUR negative 12/07/2016 1850   BILIRUBINUR neg 07/29/2014 0906   KETONESUR 5 (A) 02/03/2021 0300   PROTEINUR 100 (A) 02/03/2021 0300   UROBILINOGEN 0.2 12/07/2016 1850   NITRITE NEGATIVE 02/03/2021 0300   LEUKOCYTESUR TRACE (A) 02/03/2021 0300   Sepsis Labs: @LABRCNTIP (procalcitonin:4,lacticidven:4)  ) Recent Results (from the past 240 hour(s))  MRSA PCR Screening     Status: None   Collection Time: 02/03/21  8:51 AM   Specimen: Nasal Mucosa; Nasopharyngeal  Result Value Ref Range Status   MRSA by PCR NEGATIVE NEGATIVE Final    Comment:        The GeneXpert MRSA Assay (FDA approved for NASAL specimens only), is one component of a comprehensive MRSA colonization surveillance program. It is  not intended to diagnose MRSA infection nor to guide or monitor treatment for MRSA infections. Performed at [facility], [place] [address], [place], [place] [address]       Studies: CT ANGIO CHEST PE W OR WO CONTRAST  Result Date: 02/03/2021 CLINICAL DATA:  Hypotension, abnormal chest radiograph, elevated D-dimer, hypertension, recent COVID-19 at, former smoker, question pulmonary embolism EXAM: CT ANGIOGRAPHY CHEST WITH CONTRAST TECHNIQUE: Multidetector CT imaging of the chest was performed using the standard protocol during bolus administration of intravenous contrast. Multiplanar CT image reconstructions and MIPs were obtained to evaluate the vascular anatomy. Beam hardening artifacts from patient's arms traverse the chest and scattered respiratory motion artifacts are present, limiting exam. CONTRAST:  22mL OMNIPAQUE IOHEXOL 350 MG/ML SOLN IV COMPARISON:  Chest radiograph 02/03/2021 FINDINGS: Cardiovascular: Atherosclerotic  calcifications aorta, proximal great vessels, and coronary arteries. Aneurysmal dilatation ascending thoracic aorta 4.2 cm transverse. No evidence of aortic dissection. Heart unremarkable. No pericardial effusion. Pulmonary arteries adequately opacified. Small filling defects are seen within RIGHT middle lobe and LEFT upper lobe pulmonary arteries consistent with small age-indeterminate pulmonary emboli. Mediastinum/Nodes: Esophagus unremarkable. Base of cervical region normal appearance. No thoracic adenopathy. Lungs/Pleura: Patchy BILATERAL pulmonary infiltrates consistent with multifocal pneumonia. No pleural effusion or pneumothorax. Upper Abdomen: Bowel interposition between liver and diaphragm. Visualized upper abdomen unremarkable. Musculoskeletal: Old appearing anterior height loss of an upper thoracic vertebra approximately T5. No other focal osseous abnormalities. Review of the MIP images confirms the above findings. IMPRESSION: Small age-indeterminate pulmonary emboli in RIGHT middle lobe and LEFT upper lobe pulmonary arteries. Patchy BILATERAL pulmonary infiltrates consistent with multifocal pneumonia. Atherosclerotic calcifications including coronary arteries with aneurysmal dilatation of the ascending thoracic aorta 4.2 cm transverse; recommendation below. Recommend annual imaging followup by CTA or MRA. This recommendation follows 2010 ACCF/AHA/AATS/ACR/ASA/SCA/SCAI/SIR/STS/SVM Guidelines for the Diagnosis and Management of Patients with Thoracic Aortic Disease. Circulation. 2010; 121: F643-P295. Aortic aneurysm NOS (ICD10-I71.9) Emphysema (ICD10-J43.9). Aortic Atherosclerosis (ICD10-I70.0). Aortic aneurysm NOS (ICD10-I71.9). Findings called to Dr. Horris Latino on 02/03/2021 at 0843 hours. Electronically Signed   By: Lavonia Dana M.D.   On: 02/03/2021 08:43   DG Chest Port 1 View  Result Date: 02/03/2021 CLINICAL DATA:  Possible sepsis EXAM: PORTABLE CHEST 1 VIEW COMPARISON:  01/18/2021 FINDINGS:  Increasing regions of patchy and ill-defined opacity seen in the mid lungs and left lung base. No pneumothorax. No visible pleural effusion. Stable cardiomediastinal contours accounting for differences in technique and inflation with a calcified, tortuous aorta. No acute osseous or soft tissue abnormality. Degenerative changes are present in the imaged spine and shoulders. Telemetry leads overlie the chest. IMPRESSION: Increasing regions of patchy and ill-defined opacity in the mid lungs and left lung base, could reflect developing airspace disease and/or atelectasis appropriate clinical setting Electronically Signed   By: Lovena Le M.D.   On: 02/03/2021 02:16   VAS [place] LOWER EXTREMITY VENOUS (DVT)  Result Date: 02/03/2021  Lower Venous DVT Study Indications: Pulmonary embolism.  Risk Factors: COVID 19 positive. Limitations: Poor ultrasound/tissue interface and patient movement, patient positioning, poor patient cooperation. Comparison Study: No prior studies. Performing Technologist: Oliver Hum RVT  Examination Guidelines: A complete evaluation includes B-mode imaging, spectral Doppler, color Doppler, and power Doppler as needed of all accessible portions of each vessel. Bilateral testing is considered an integral part of a complete examination. Limited examinations for reoccurring indications may be performed as noted. The reflux portion of the exam is performed with the patient in reverse Trendelenburg.  +---------+---------------+---------+-----------+----------+--------------+ RIGHT  CompressibilityPhasicitySpontaneityPropertiesThrombus Aging +---------+---------------+---------+-----------+----------+--------------+ CFV      Full           Yes      Yes                                 +---------+---------------+---------+-----------+----------+--------------+ SFJ      Full                                                         +---------+---------------+---------+-----------+----------+--------------+ FV Prox  Full                                                        +---------+---------------+---------+-----------+----------+--------------+ FV Mid   Full                                                        +---------+---------------+---------+-----------+----------+--------------+ FV DistalFull                                                        +---------+---------------+---------+-----------+----------+--------------+ PFV      Full                                                        +---------+---------------+---------+-----------+----------+--------------+ POP      Full           Yes      Yes                                 +---------+---------------+---------+-----------+----------+--------------+ PTV      Full                                                        +---------+---------------+---------+-----------+----------+--------------+ PERO     Full                                                        +---------+---------------+---------+-----------+----------+--------------+   +---------+---------------+---------+-----------+----------+--------------+ LEFT     CompressibilityPhasicitySpontaneityPropertiesThrombus Aging +---------+---------------+---------+-----------+----------+--------------+ CFV      Full           Yes      Yes                                 +---------+---------------+---------+-----------+----------+--------------+  SFJ      Full                                                        +---------+---------------+---------+-----------+----------+--------------+ FV Prox  Full                                                        +---------+---------------+---------+-----------+----------+--------------+ FV Mid   Full                                                         +---------+---------------+---------+-----------+----------+--------------+ FV DistalFull                                                        +---------+---------------+---------+-----------+----------+--------------+ PFV      Full                                                        +---------+---------------+---------+-----------+----------+--------------+ POP      Full           Yes      Yes                                 +---------+---------------+---------+-----------+----------+--------------+ PTV      Full                                                        +---------+---------------+---------+-----------+----------+--------------+ PERO     Full                                                        +---------+---------------+---------+-----------+----------+--------------+     Summary: RIGHT: - There is no evidence of deep vein thrombosis in the lower extremity. However, portions of this examination were limited- see technologist comments above.  - No cystic structure found in the popliteal fossa.  LEFT: - There is no evidence of deep vein thrombosis in the lower extremity. However, portions of this examination were limited- see technologist comments above.  - No cystic structure found in the popliteal fossa.  *See table(s) above for measurements and observations.    Preliminary     Scheduled Meds: . chlorhexidine  15 mL Mouth Rinse BID  . Chlorhexidine Gluconate Cloth  6 each Topical Daily  .  divalproex  500 mg Oral QHS  . levothyroxine  112 mcg Oral Q0600  . mouth rinse  15 mL Mouth Rinse q12n4p  . tamsulosin  0.4 mg Oral Daily  . traZODone  50 mg Oral QHS    Continuous Infusions: . ceFEPime (MAXIPIME) IV Stopped (02/03/21 0742)  . heparin 1,300 Units/hr (02/03/21 1207)  . lactated ringers 100 mL/hr at 02/03/21 1207  . metronidazole Stopped (02/03/21 0756)  . vancomycin Stopped (02/03/21 0940)     LOS: 0 days     Alma Friendly,  MD [facility]  If 7PM-7AM, please contact night-coverage [email or web address] 02/03/2021, 12:52 PM

## 2021-02-03 NOTE — Progress Notes (Signed)
[place] for IV heparin Indication: per CT, small age-indeterminate pulmonary emboli in RIGHT middle lobe and LEFT upper lobe pulmonary arteries.  No Known Allergies  Patient Measurements: Height: 6\' 2"  (188 cm) Weight: 77.3 kg (170 lb 6.7 oz) IBW/kg (Calculated) : 82.2 Heparin Dosing Weight: 77 kg  Vital Signs: Temp: 98.4 F (36.9 C) (02/25 1950) Temp Source: Oral (02/25 1950) BP: 130/63 (02/25 1800) Pulse Rate: 100 (02/25 1800)  Labs: Recent Labs    02/03/21 0155 02/03/21 2033  HGB 13.0  --   HCT 39.7  --   PLT 295  --   APTT 33  --   LABPROT 14.0  --   INR 1.1  --   HEPARINUNFRC  --  0.19*  CREATININE 0.88  --     Estimated Creatinine Clearance: 63.4 mL/min (by C-G formula based on SCr of 0.88 mg/dL).   Medical History: Past Medical History:  Diagnosis Date  . Allergy   . Arthritis   . Depression   . Hypertension   . Kyphosis   . Spondylolisthesis 03/18/2017  . Thyroid disease   . Tinnitus     Medications:  Scheduled:  . (feeding supplement) PROSource Plus  30 mL Oral BID BM  . vitamin C  500 mg Oral Daily  . chlorhexidine  15 mL Mouth Rinse BID  . Chlorhexidine Gluconate Cloth  6 each Topical Daily  . divalproex  500 mg Oral QHS  . feeding supplement  237 mL Oral BID BM  . Ipratropium-Albuterol  1 puff Inhalation Q6H  . levothyroxine  112 mcg Oral Q0600  . mouth rinse  15 mL Mouth Rinse q12n4p  . multivitamin  15 mL Per Tube Daily  . traZODone  50 mg Oral QHS  . vancomycin (VANCOCIN) rectal ENEMA  500 mg Rectal Q6H  . zinc sulfate  220 mg Oral Daily   Infusions:  . ceFEPime (MAXIPIME) IV Stopped (02/03/21 1437)  . heparin 1,300 Units/hr (02/03/21 1645)  . lactated ringers 100 mL/hr at 02/03/21 1645  . metronidazole Stopped (02/03/21 1619)    Assessment: 87 yo male with sepsis already started on IV abx's. Note patient admitted previously on 2/9 with COVID treated with remdesivir. Per CT, Small  age-indeterminate pulmonary emboli in RIGHT middle lobe and LEFT upper lobe pulmonary arteries. To start IV heparin per Rx per Md orders. Baseline labs drawn. Note Lovenox 40mg  given at 0703.   02/03/2021:  Initial heparin level 0.19-subtherapeutic on IV heparin 1300 units/hr  Bleeding noted earlier at groin where patient pulled out foley  No infusion related concerns since resumed earlier per RN  Goal of Therapy:  Heparin level 0.3-0.7 units/ml Monitor platelets by anticoagulation protocol: Yes   Plan:   2000 units IV heparin bolus x1  Increase IV heparin to 1500 units/hr  Check heparin level 8 hours after IV heparin increased  Daily CBC  Netta Cedars PharmD 02/03/2021,9:04 PM

## 2021-02-03 NOTE — ED Provider Notes (Signed)
[facility] DEPT Provider Note   CSN: [id number] Arrival date & time: 02/03/21  0104     History Chief Complaint  Patient presents with  . Foley catheter problem    Larry Cordova is a 87 y.o. male.  Patient sent to the emergency department from home by ambulance.  Patient has a history of severe dementia, caregivers are his family at home.  Family noticed blood in his Foley catheter.  He has a history of pulling on his catheter and family was concerned that he might have partially dislodged the catheter.  Patient is non-verbal at baseline, per EMS to obtain history from family.  Level 5 caveat due to nonverbal patient/dementia.        Past Medical History:  Diagnosis Date  . Allergy   . Arthritis   . Depression   . Hypertension   . Kyphosis   . Spondylolisthesis 03/18/2017  . Thyroid disease   . Tinnitus     Patient Active Problem List   Diagnosis Date Noted  . Sepsis ([place]) 02/03/2021  . Elevated troponin level not due myocardial infarction 01/19/2021  . COVID-19 virus infection 01/19/2021  . AKI (acute kidney injury) ([place]) 01/18/2021  . Leukocytosis 01/18/2021  . Dehydration with hyponatremia 01/18/2021  . Acute urinary retention 01/18/2021  . Lactic acidosis 01/18/2021  . PAD (peripheral artery disease) ([place]) 08/18/2019  . Dementia with behavioral disturbance ([place]) 05/18/2019  . Spondylolisthesis 03/18/2017  . Frontotemporal dementia ([place]) 01/03/2017  . Skin cancer 07/29/2014  . Other and unspecified hyperlipidemia 07/29/2014  . Arthritis 07/13/2013  . Hypothyroidism 06/16/2012  . HTN (hypertension) 06/16/2012    Past Surgical History:  Procedure Laterality Date  . HERNIA REPAIR         Family History  Problem Relation Age of Onset  . Kidney failure Mother   . Dementia Neg Hx     Social History   Tobacco Use  . Smoking status: Former Smoker    Years: 30.00    Types: Cigarettes, Pipe, Cigars  . Smokeless  tobacco: Never Used  Substance Use Topics  . Alcohol use: No  . Drug use: No    Home Medications Prior to Admission medications   Medication Sig Start Date End Date Taking? Authorizing Provider  divalproex (DEPAKOTE ER) 250 MG 24 hr tablet TAKE 2 TABLETS(500 MG) BY MOUTH AT BEDTIME 08/17/20   Lomax, Amy, NP  feeding supplement (ENSURE ENLIVE / ENSURE PLUS) LIQD Take 237 mLs by mouth 2 (two) times daily between meals. 01/23/21   Dahal, Marlowe Aschoff, MD  guaiFENesin-dextromethorphan (ROBITUSSIN DM) 100-10 MG/5ML syrup Take 10 mLs by mouth every 4 (four) hours as needed for cough. 01/23/21   Terrilee Croak, MD  levothyroxine (SYNTHROID) 112 MCG tablet TAKE 1 TABLET(112 MCG) BY MOUTH DAILY 04/01/20   [name], MD  Nutritional Supplements (,FEEDING SUPPLEMENT, PROSOURCE PLUS) liquid Take 30 mLs by mouth daily. 01/23/21 04/23/21  Terrilee Croak, MD  tamsulosin (FLOMAX) 0.4 MG CAPS capsule Take 1 capsule (0.4 mg total) by mouth daily. 01/24/21 04/24/21  Terrilee Croak, MD  traZODone (DESYREL) 50 MG tablet Take 1 tablet (50 mg total) by mouth at bedtime. 01/23/21 04/23/21  Terrilee Croak, MD    Allergies    Patient has no known allergies.  Review of Systems   Review of Systems  Unable to perform ROS: Dementia    Physical Exam Updated Vital Signs BP (!) 64/46   Pulse (!) 103   Temp (!) 103 F (39.4 C) (  Rectal)   Resp 13   Ht 6\' 2"  (1.88 m)   Wt 77.3 kg   SpO2 95%   BMI 21.88 kg/m   Physical Exam Vitals and nursing note reviewed.  Constitutional:      Appearance: He is well-developed and well-nourished.     Comments: Shivering   HENT:     Head: Normocephalic and atraumatic.     Right Ear: Hearing normal.     Left Ear: Hearing normal.     Nose: Nose normal.     Mouth/Throat:     Mouth: Oropharynx is clear and moist and mucous membranes are normal.  Eyes:     Extraocular Movements: EOM normal.     Conjunctiva/sclera: Conjunctivae normal.     Pupils: Pupils are equal, round, and  reactive to light.  Cardiovascular:     Rate and Rhythm: Regular rhythm.     Heart sounds: S1 normal and S2 normal. No murmur heard. No friction rub. No gallop.   Pulmonary:     Effort: Pulmonary effort is normal. No respiratory distress.     Breath sounds: Normal breath sounds.  Chest:     Chest wall: No tenderness.  Abdominal:     General: Bowel sounds are normal.     Palpations: Abdomen is soft. There is no hepatosplenomegaly.     Tenderness: There is no abdominal tenderness. There is no guarding or rebound. Negative signs include Murphy's sign and McBurney's sign.     Hernia: No hernia is present.  Musculoskeletal:        General: Normal range of motion.     Cervical back: Normal range of motion and neck supple.  Skin:    General: Skin is warm, dry and intact.     Findings: No rash.     Nails: There is no cyanosis.  Neurological:     General: No focal deficit present.     Mental Status: He is confused.     Cranial Nerves: No cranial nerve deficit.     Sensory: No sensory deficit.     Coordination: Coordination normal.     Deep Tendon Reflexes: Strength normal.  Psychiatric:        Mood and Affect: Mood and affect normal.     ED Results / Procedures / Treatments   Labs (all labs ordered are listed, but only abnormal results are displayed) Labs Reviewed  LACTIC ACID, PLASMA - Abnormal; Notable for the following components:      Result Value   Lactic Acid, Venous 2.1 (*)    All other components within normal limits  LACTIC ACID, PLASMA - Abnormal; Notable for the following components:   Lactic Acid, Venous 2.1 (*)    All other components within normal limits  COMPREHENSIVE METABOLIC PANEL - Abnormal; Notable for the following components:   Calcium 8.3 (*)    Albumin 2.6 (*)    All other components within normal limits  CBC WITH DIFFERENTIAL/PLATELET - Abnormal; Notable for the following components:   WBC 24.2 (*)    RBC 4.20 (*)    Neutro Abs 20.5 (*)     Monocytes Absolute 2.0 (*)    Abs Immature Granulocytes 0.19 (*)    All other components within normal limits  URINALYSIS, ROUTINE W REFLEX MICROSCOPIC - Abnormal; Notable for the following components:   Color, Urine AMBER (*)    APPearance CLOUDY (*)    Hgb urine dipstick LARGE (*)    Ketones, ur 5 (*)    Protein,  ur 100 (*)    Leukocytes,Ua TRACE (*)    RBC / HPF >50 (*)    All other components within normal limits  CULTURE, BLOOD (SINGLE)  URINE CULTURE  CULTURE, BLOOD (SINGLE)  CULTURE, BLOOD (ROUTINE X 2)  CULTURE, BLOOD (ROUTINE X 2)  PROTIME-INR  APTT  D-DIMER, QUANTITATIVE  PROCALCITONIN  LACTATE DEHYDROGENASE  FERRITIN  TRIGLYCERIDES  FIBRINOGEN  C-REACTIVE PROTEIN  BRAIN NATRIURETIC PEPTIDE  LACTIC ACID, PLASMA  LACTIC ACID, PLASMA  LACTIC ACID, PLASMA  LACTIC ACID, PLASMA  LACTIC ACID, PLASMA  LACTIC ACID, PLASMA  LACTIC ACID, PLASMA  LACTIC ACID, PLASMA  LACTIC ACID, PLASMA    EKG None  Radiology DG Chest Port 1 View  Result Date: 02/03/2021 CLINICAL DATA:  Possible sepsis EXAM: PORTABLE CHEST 1 VIEW COMPARISON:  01/18/2021 FINDINGS: Increasing regions of patchy and ill-defined opacity seen in the mid lungs and left lung base. No pneumothorax. No visible pleural effusion. Stable cardiomediastinal contours accounting for differences in technique and inflation with a calcified, tortuous aorta. No acute osseous or soft tissue abnormality. Degenerative changes are present in the imaged spine and shoulders. Telemetry leads overlie the chest. IMPRESSION: Increasing regions of patchy and ill-defined opacity in the mid lungs and left lung base, could reflect developing airspace disease and/or atelectasis appropriate clinical setting Electronically Signed   By: Lovena Le M.D.   On: 02/03/2021 02:16    Procedures Procedures   Medications Ordered in ED Medications  lactated ringers bolus 1,000 mL (1,000 mLs Intravenous New Bag/Given 02/03/21 0511)    And   lactated ringers bolus 1,000 mL (has no administration in time range)    And  lactated ringers bolus 500 mL (has no administration in time range)  ceFEPIme (MAXIPIME) 2 g in sodium chloride 0.9 % 100 mL IVPB (has no administration in time range)  metroNIDAZOLE (FLAGYL) IVPB 500 mg (has no administration in time range)  vancomycin (VANCOCIN) IVPB 1000 mg/200 mL premix (has no administration in time range)  acetaminophen (TYLENOL) suppository 650 mg (650 mg Rectal Given 02/03/21 8756)    ED Course  I have reviewed the triage vital signs and the nursing notes.  Pertinent labs & imaging results that were available during my care of the patient were reviewed by me and considered in my medical decision making (see chart for details).    MDM Rules/Calculators/A&P                          Patient's wife initially sent him to the emergency department because she was concerned about a problem with his Foley catheter.  Patient noted to be exhibiting rigors at arrival and had temperature of 102.7.  Initiated as undifferentiated sepsis.  Chest x-ray with evidence of pneumonia, added community-acquired pneumonia coverage.  Patient with significant leukocytosis, tachycardia, lactic acid 2.1.  Initially did not receive fluids because blood pressure was normal and lactic acid was not significantly elevated.  Upon reviewing his records further, it was found that he had an incidental positive Covid test during his hospitalization 2 weeks ago.  At that time he was not having any respiratory symptoms.  Does not appear to be hypoxic at this time, but chest x-ray findings might be secondary to the Covid.  Covid labs added.  He did, however, have 3 out of 4 blood cultures positive for staph epi and it was felt that this was a true bacteremia.  He was treated with vancomycin in the hospital  and finished Zyvox at home.  Patient becoming hypotensive here in the department, after contact with hospitalist was made to  arrange for admission..  Fluid bolus of 30 mL/kg of lactated Ringer's is initiated.  Vancomycin added for staph coverage based on his previous bacteremia.  CRITICAL CARE Performed by: Orpah Greek   Total critical care time: 35 minutes  Critical care time was exclusive of separately billable procedures and treating other patients.  Critical care was necessary to treat or prevent imminent or life-threatening deterioration.  Critical care was time spent personally by me on the following activities: development of treatment plan with patient and/or surrogate as well as nursing, discussions with consultants, evaluation of patient's response to treatment, examination of patient, obtaining history from patient or surrogate, ordering and performing treatments and interventions, ordering and review of laboratory studies, ordering and review of radiographic studies, pulse oximetry and re-evaluation of patient's condition.  Final Clinical Impression(s) / ED Diagnoses Final diagnoses:  Sepsis with encephalopathy without septic shock, due to unspecified organism [facility])    Rx / DC Orders ED Discharge Orders    None       Kamar Callender, Gwenyth Allegra, MD 02/03/21 [phone]

## 2021-02-03 NOTE — ED Notes (Signed)
[place], MD notified BP 71/49

## 2021-02-03 NOTE — Telephone Encounter (Signed)
Patient currently admitted for sepsis.

## 2021-02-03 NOTE — Progress Notes (Signed)
NOTED PT IS UNABLE TO FOLLOW COMMANDS. PT CAN NOT SWOLLOW ON COMMAND , NOR IS PT COOPERATING

## 2021-02-03 NOTE — Progress Notes (Signed)
Initial Nutrition Assessment  DOCUMENTATION CODES:   Not applicable  INTERVENTION:  - will order Ensure Enlive BID, each supplement provides 350 kcal and 20 grams of protein. - will order 30 ml Prosource Plus BID, each supplement provides 100 kcal and 15 grams protein.  - will order 15 ml liquid multivitamin/day.  - complete NFPE when feasible.  NUTRITION DIAGNOSIS:   Increased nutrient needs related to acute illness,catabolic illness (YKZLD-35 infection) as evidenced by estimated needs.  GOAL:   Patient will meet greater than or equal to 90% of their needs  MONITOR:   PO intake,Supplement acceptance,Labs,Weight trends  REASON FOR ASSESSMENT:   Malnutrition Screening Tool  ASSESSMENT:   87 y.o. male with medical history of advanced dementia with behavioral disturbance, HTN, hypothyroidism, urinary retention with Foley catheter, COVID-19 diagnosed on 01/18/2021. He was recently hospitalized due to ARF and staph epidermis bacteremia. He presented to the ED after pulling Foley part way out at home. Wife reported that patient has had a poor appetite and having diarrhea since discharge.  Patient noted to be a/o to self only. Able to talk with RN who talked with patient's daughter on the phone a short time ago. Patient is able to feed himself at home but does not feed himself when in the hospital. Current mentation is patient's baseline; he is unable to provide any information.   His daughter reported that heis bed bound and that he has lost "a lot" of weight in the past 1 year. Weight today is 170 lb and weight on 01/11/20 was 175 lb. This indicates 5 lb weight loss (2.8% body weight) in the past 1 year. No information documented in the edema section of flow sheet.   RN reports that patient moved to the floor after breakfast and that he has not yet received a lunch tray. Patient was pocketing pills earlier today.   Per notes: - sepsis 2/2 multifocal PNA/HCAP - COVID-19 infection dx on  2/9 - acute pulmonary embolism - urinary retention   Labs reviewed; Ca: 8.3 mg/dl. Medications reviewed; 500 mg ascorbic acid/day, 112 mcg oral synthroid/day, 220 mg zinc sulfate/day.  IVF; LR @ 100 ml/hr.    NUTRITION - FOCUSED PHYSICAL EXAM:  unable to complete at this time.   Diet Order:   Diet Order            Diet Heart Room service appropriate? Yes; Fluid consistency: Thin  Diet effective now                 EDUCATION NEEDS:   Not appropriate for education at this time  Skin:  Skin Assessment: Skin Integrity Issues: Skin Integrity Issues:: Stage II,Unstageable Stage II: R tibial area; L thigh Unstageable: full thickness to R thigh  Last BM:  2/25  Height:   Ht Readings from Last 1 Encounters:  02/03/21 6\' 2"  (1.88 m)    Weight:   Wt Readings from Last 1 Encounters:  02/03/21 77.3 kg     Estimated Nutritional Needs:  Kcal:  1935-2165 kcal Protein:  100-110 grams Fluid:  >/= 2.5 L/day      Jarome Matin, MS, RD, LDN, CNSC Inpatient Clinical Dietitian RD pager # available in [place]  After hours/weekend pager # available in [facility]

## 2021-02-03 NOTE — Progress Notes (Signed)
ANTICOAGULATION CONSULT NOTE - Initial Consult  Pharmacy Consult for IV heparin Indication: per CT, small age-indeterminate pulmonary emboli in RIGHT middle lobe and LEFT upper lobe pulmonary arteries.  No Known Allergies  Patient Measurements: Height: 6\' 2"  (188 cm) Weight: 77.3 kg (170 lb 6.7 oz) IBW/kg (Calculated) : 82.2 Heparin Dosing Weight: 77 kg  Vital Signs: Temp: 100.1 F (37.8 C) (02/25 0653) Temp Source: Rectal (02/25 0653) BP: 116/76 (02/25 0630) Pulse Rate: 107 (02/25 0630)  Labs: Recent Labs    02/03/21 0155  HGB 13.0  HCT 39.7  PLT 295  APTT 33  LABPROT 14.0  INR 1.1  CREATININE 0.88    Estimated Creatinine Clearance: 63.4 mL/min (by C-G formula based on SCr of 0.88 mg/dL).   Medical History: Past Medical History:  Diagnosis Date  . Allergy   . Arthritis   . Depression   . Hypertension   . Kyphosis   . Spondylolisthesis 03/18/2017  . Thyroid disease   . Tinnitus     Medications:  Scheduled:  . chlorhexidine  15 mL Mouth Rinse BID  . Chlorhexidine Gluconate Cloth  6 each Topical Daily  . divalproex  500 mg Oral QHS  . levothyroxine  112 mcg Oral Q0600  . mouth rinse  15 mL Mouth Rinse q12n4p  . tamsulosin  0.4 mg Oral Daily  . traZODone  50 mg Oral QHS   Infusions:  . ceFEPime (MAXIPIME) IV Stopped (02/03/21 0742)  . lactated ringers    . lactated ringers    . metronidazole Stopped (02/03/21 0756)  . vancomycin 150 mL/hr at 02/03/21 4825    Assessment: 87 yo male with sepsis already started on IV abx's. Note patient admitted previously on 2/9 with COVID treated with remdesivir. Per CT, Small age-indeterminate pulmonary emboli in RIGHT middle lobe and LEFT upper lobe pulmonary arteries. To start IV heparin per Rx per Md orders. Baseline labs drawn. Note Lovenox 40mg  given at 0703.   Goal of Therapy:  Heparin level 0.3-0.7 units/ml Monitor platelets by anticoagulation protocol: Yes   Plan:   No IV heparin bolus with patient just  receiving Lovenox at 0700  Start IV heparin at rate of 1300 units/hr  Check heparin level 8 hours after start of IV heparin  Daily CBC  Kara Mead 02/03/2021,8:58 AM

## 2021-02-03 NOTE — Sepsis Progress Note (Signed)
Following for sepsis monitoring ?

## 2021-02-03 NOTE — Progress Notes (Signed)
Bilateral lower extremity venous duplex has been completed. Preliminary results can be found in CV Proc through chart review.   02/03/21 10:01 AM Carlos Levering RVT

## 2021-02-03 NOTE — ED Triage Notes (Signed)
Patient BIB EMS per family request due to possibly pulling out his foley catheter. Patient has dementia and is not alert or oriented. Patient arrives with foley most of the way out and upon removal, patient started bleeding.

## 2021-02-04 DIAGNOSIS — U071 COVID-19: Secondary | ICD-10-CM | POA: Diagnosis not present

## 2021-02-04 DIAGNOSIS — F0391 Unspecified dementia with behavioral disturbance: Secondary | ICD-10-CM | POA: Diagnosis not present

## 2021-02-04 DIAGNOSIS — A419 Sepsis, unspecified organism: Secondary | ICD-10-CM | POA: Diagnosis not present

## 2021-02-04 DIAGNOSIS — A0472 Enterocolitis due to Clostridium difficile, not specified as recurrent: Secondary | ICD-10-CM

## 2021-02-04 DIAGNOSIS — R338 Other retention of urine: Secondary | ICD-10-CM | POA: Diagnosis not present

## 2021-02-04 LAB — GASTROINTESTINAL PANEL BY PCR, STOOL (REPLACES STOOL CULTURE)

## 2021-02-04 LAB — BASIC METABOLIC PANEL
Anion gap: 8 (ref 5–15)
BUN: 13 mg/dL (ref 8–23)
CO2: 23 mmol/L (ref 22–32)
Calcium: 7.3 mg/dL — ABNORMAL LOW (ref 8.9–10.3)
Chloride: 108 mmol/L (ref 98–111)
Creatinine, Ser: 0.69 mg/dL (ref 0.61–1.24)
GFR, Estimated: >60 mL/min (ref >60)
Glucose, Bld: 88 mg/dL (ref 70–99)
Potassium: 3.6 mmol/L (ref 3.5–5.1)
Sodium: 139 mmol/L (ref 135–145)

## 2021-02-04 LAB — CBC WITH DIFFERENTIAL/PLATELET
Abs Immature Granulocytes: 0.2 10*3/uL — ABNORMAL HIGH (ref 0.00–0.07)
Basophils Absolute: 0.1 10*3/uL (ref 0.0–0.1)
Basophils Relative: 0 %
Eosinophils Absolute: 0.2 10*3/uL (ref 0.0–0.5)
Eosinophils Relative: 1 %
HCT: 30.4 % — ABNORMAL LOW (ref 39.0–52.0)
Hemoglobin: 9.8 g/dL — ABNORMAL LOW (ref 13.0–17.0)
Immature Granulocytes: 1 %
Lymphocytes Relative: 8 %
Lymphs Abs: 1.3 10*3/uL (ref 0.7–4.0)
MCH: 31.2 pg (ref 26.0–34.0)
MCHC: 32.2 g/dL (ref 30.0–36.0)
MCV: 96.8 fL (ref 80.0–100.0)
Monocytes Absolute: 1.4 10*3/uL — ABNORMAL HIGH (ref 0.1–1.0)
Monocytes Relative: 9 %
Neutro Abs: 13.1 10*3/uL — ABNORMAL HIGH (ref 1.7–7.7)
Neutrophils Relative %: 81 %
Platelets: 212 10*3/uL (ref 150–400)
RBC: 3.14 MIL/uL — ABNORMAL LOW (ref 4.22–5.81)
RDW: 13.5 % (ref 11.5–15.5)
WBC: 16.1 10*3/uL — ABNORMAL HIGH (ref 4.0–10.5)
nRBC: 0 % (ref 0.0–0.2)

## 2021-02-04 LAB — HEPARIN LEVEL (UNFRACTIONATED)
Heparin Unfractionated: <0.1 IU/mL — ABNORMAL LOW (ref 0.30–0.70)
Heparin Unfractionated: <0.1 IU/mL — ABNORMAL LOW (ref 0.30–0.70)
Heparin Unfractionated: 0.18 IU/mL — ABNORMAL LOW (ref 0.30–0.70)

## 2021-02-04 LAB — PROCALCITONIN: Procalcitonin: 0.35 ng/mL

## 2021-02-04 LAB — CBC
HCT: 31.5 % — ABNORMAL LOW (ref 39.0–52.0)
Hemoglobin: 10 g/dL — ABNORMAL LOW (ref 13.0–17.0)
MCH: 30.9 pg (ref 26.0–34.0)
MCHC: 31.7 g/dL (ref 30.0–36.0)
MCV: 97.2 fL (ref 80.0–100.0)
Platelets: 233 10*3/uL (ref 150–400)
RBC: 3.24 MIL/uL — ABNORMAL LOW (ref 4.22–5.81)
RDW: 13.4 % (ref 11.5–15.5)
WBC: 16.2 10*3/uL — ABNORMAL HIGH (ref 4.0–10.5)
nRBC: 0 % (ref 0.0–0.2)

## 2021-02-04 LAB — C-REACTIVE PROTEIN: CRP: 16.9 mg/dL — ABNORMAL HIGH (ref <1.0)

## 2021-02-04 MED ORDER — LEVOTHYROXINE SODIUM 100 MCG/5ML IV SOLN: 56.0000 ug | Freq: Every day | INTRAVENOUS | Status: DC

## 2021-02-04 MED ORDER — HEPARIN BOLUS VIA INFUSION
2000.0000 [IU] | INTRAVENOUS | Status: DC
  Filled 2021-02-04: qty 2000

## 2021-02-04 MED ORDER — HEPARIN BOLUS VIA INFUSION
2000.0000 [IU] | Freq: Once | INTRAVENOUS | Status: DC
  Filled 2021-02-04: qty 2000

## 2021-02-04 MED ORDER — DEXTROSE-NACL 5-0.45 % IV SOLN: INTRAVENOUS | Status: DC

## 2021-02-04 MED ORDER — HEPARIN (PORCINE) 25000 UT/250ML-% IV SOLN
1750.0000 [IU]/h | INTRAVENOUS | Status: DC
  Administered 2021-02-04: 1750 [IU]/h via INTRAVENOUS

## 2021-02-04 MED ORDER — HEPARIN (PORCINE) 25000 UT/250ML-% IV SOLN
1750.0000 [IU]/h | INTRAVENOUS | Status: DC
  Administered 2021-02-04: 1750 [IU]/h via INTRAVENOUS
  Filled 2021-02-04: qty 250

## 2021-02-04 MED ORDER — HEPARIN BOLUS VIA INFUSION
4000.0000 [IU] | INTRAVENOUS | Status: AC
  Administered 2021-02-04: 4000 [IU] via INTRAVENOUS
  Filled 2021-02-04: qty 4000

## 2021-02-04 MED ORDER — HEPARIN (PORCINE) 25000 UT/250ML-% IV SOLN
1500.0000 [IU]/h | INTRAVENOUS | Status: DC
  Filled 2021-02-04: qty 250

## 2021-02-04 MED ORDER — HEPARIN BOLUS VIA INFUSION
2000.0000 [IU] | Freq: Once | INTRAVENOUS | Status: AC
  Administered 2021-02-04: 2000 [IU] via INTRAVENOUS
  Filled 2021-02-04: qty 2000

## 2021-02-04 NOTE — Progress Notes (Signed)
[place] for IV heparin Indication: per CT, small age-indeterminate pulmonary emboli in RIGHT middle lobe and LEFT upper lobe pulmonary arteries.  No Known Allergies  Patient Measurements: Height: 6\' 2"  (188 cm) Weight: 77.3 kg (170 lb 6.7 oz) IBW/kg (Calculated) : 82.2 Heparin Dosing Weight: 77 kg  Vital Signs: Temp: 98.2 F (36.8 C) (02/26 2000) Temp Source: Oral (02/26 2000) BP: 151/63 (02/26 2000) Pulse Rate: 92 (02/26 2000)  Labs: Recent Labs    02/03/21 0155 02/03/21 2033 02/04/21 0539 02/04/21 0831 02/04/21 1812  HGB 13.0  --  9.8* 10.0*  --   HCT 39.7  --  30.4* 31.5*  --   PLT 295  --  212 233  --   APTT 33  --   --   --   --   LABPROT 14.0  --   --   --   --   INR 1.1  --   --   --   --   HEPARINUNFRC  --    < > <0.10* <0.10* 0.18*  CREATININE 0.88  --  0.69  --   --    < > = values in this interval not displayed.    Estimated Creatinine Clearance: 69.8 mL/min (by C-G formula based on SCr of 0.69 mg/dL).   Assessment: 87 yo male with sepsis already started on IV abx's. Note patient admitted previously on 2/9 with COVID treated with remdesivir. Per CT, Small age-indeterminate pulmonary emboli in RIGHT middle lobe and LEFT upper lobe pulmonary arteries. To start IV heparin per Rx per Md orders. Baseline labs drawn. Note Lovenox 40mg  given at 0703.   02/04/2021: AM heparin level undetectable - third shift RN says heparin infusing well, repeat HL @ 0830 am remains undetectable. Day shift RN says heparin not hanging> this explains undetectable level Hg 13>10, PLT WNL Bleeding noted earlier at groin where patient pulled out foley- continues unchanged from admission per RN 1816 Heparin level low at 0.18 units/ml  Goal of Therapy:  Heparin level 0.3-0.7 units/ml Monitor platelets by anticoagulation protocol: Yes   Plan:   Re-bolus 2000 units Heparin  Increase IV heparin to 1750 units/hr  Check heparin level 8 hours  after IV heparin resumed  Daily CBC & heparin level  Minda Ditto PharmD 02/04/2021, 9:22 PM

## 2021-02-04 NOTE — Plan of Care (Signed)
  Problem: Safety: Goal: Non-violent Restraint(s) Outcome: Not Progressing   Problem: Clinical Measurements: Goal: Respiratory complications will improve Outcome: Progressing   Problem: Respiratory: Goal: Will maintain a patent airway Outcome: Progressing

## 2021-02-04 NOTE — Progress Notes (Signed)
PROGRESS NOTE  Larry Cordova AOZ:[id number] DOB: [birth year] DOA: 02/03/2021 PCP: [name], MD  HPI/Recap of past 24 hours: HPI from Dr Larry Cordova is a 87 y.o. male with medical history significant for advanced dementia with behavioral disturbance, HTN, hypothyroidism, urinary retention with Foley catheter, COVID-19 diagnosed on 01/18/2021, and recent hospitalization with acute renal failure and staph epidermidis bacteremia, now presenting to the ED for evaluation of Foley catheter problem. The patient was brought in by his wife after he appeared to have pulled his Foley catheter part of the way out.  Family noted some blood in the catheter.  Patient is unable to contribute to the history due to his dementia. Discussed with wife who noted that patient has been more confused than his baseline, with poor appetite and has been having diarrhea since his discharge from hospital.  In the ED, patient is found to be in rigors with temperature 102.7, tachypnea, tachycardia, and hypotension. Chest x-ray demonstrates increased regions of patchy and ill-defined opacity. CBC notable for leukocytosis of 24,200.  Lactic acid 2.1.  Blood and urine culture were collected. Patient was given 30 cc/kg LR, acetaminophen, vancomycin, cefepime, and Flagyl.  Patient admitted for further management.    Today, patient alert, awake, speech still muffled (baseline as per wife), unable to perform ROS.    Assessment/Plan: Principal Problem:   Sepsis ([place]) Active Problems:   Hypothyroidism   HTN (hypertension)   Dementia with behavioral disturbance ([place])   Acute urinary retention   COVID-19 virus infection   Pressure injury of skin   Sepsis 2/2 multifocal pneumonia/HCAP C. difficile colitis infection On presentation, noted to have fever, tachycardia, hypotension, marked leukocytosis, and mild elevation in lactate  UA with large hemoglobin, trace leukocytes, 11-20 WBC UC and BC x2 pending Lactic  acid trended down Procalcitonin 0.19, will trend CXR possible multifocal pneumonia or may be related to recent covid infection CTA chest done showed multifocal pneumonia Continue cefepime, Flagyl, discontinue vancomycin Continue IV fluids Monitor closely  C. difficile colitis infection Noted watery stool C. Difficile positive, GI panel pending Due to n.p.o. status, and sepsis will do rectal vancomycin, continue IV Flagyl  COVID-19 infection Diagnosed 01/18/21  Completed remdesivir during last admission Currently saturating well on room air, no indication yet to start steroids Plan to continue isolation through 02/08/21  Monitor closely for change in oxygen requirement  Acute pulmonary embolism Small age indeterminant PE in the right middle lobe and left upper lobe on CTA chest Bilateral Doppler lower extremity negative for DVT Continue heparin drip, plan to switch to DOAC once able  Dementia  Continue Depakote qHS once able Supportive care with delirium precautions    Hypothyroidism  Continue Synthroid    Urinary retention  Held Flomax, Foley care      Estimated body mass index is 21.88 kg/m as calculated from the following:   Height as of this encounter: 6\' 2"  (1.88 m).   Weight as of this encounter: 77.3 kg.     Code Status: DNR, discussed extensively with wife on 02/03/2021  Family Communication: Discussed with wife on 02/03/2021  Disposition Plan: Status is: Inpatient  Remains inpatient appropriate because:Inpatient level of care appropriate due to severity of illness   Dispo: The patient is from: Home              Anticipated d/c is to: Home              Patient currently is not medically stable  to d/c.   Difficult to place patient No    Consultants:  None  Procedures:  None  Antimicrobials:  Cefepime  Flagyl  P.o. vancomycin  DVT prophylaxis: Heparin drip   Objective: Vitals:   02/04/21 0823 02/04/21 1000 02/04/21 1100 02/04/21 1207   BP:  (!) 100/44 (!) 100/48 (!) 110/55  Pulse:  62 63 66  Resp:  17 11 19   Temp: 98.4 F (36.9 C)     TempSrc: Axillary     SpO2:  94% 93% 98%  Weight:      Height:        Intake/Output Summary (Last 24 hours) at 02/04/2021 1321 Last data filed at 02/04/2021 1100 Gross per 24 hour  Intake 2509.13 ml  Output 1445 ml  Net 1064.13 ml   Filed Weights   02/03/21 0129  Weight: 77.3 kg    Exam:  General: Acutely ill-appearing, muffled speech, alert, awake, not oriented  Cardiovascular: S1, S2 present  Respiratory: CTAB  Abdomen: Soft, nontender, nondistended, bowel sounds present  Musculoskeletal: No bilateral pedal edema noted  Skin: Normal  Psychiatry:  Unable to assess   Data Reviewed: CBC: Recent Labs  Lab 02/03/21 0155 02/04/21 0539 02/04/21 0831  WBC 24.2* 16.1* 16.2*  NEUTROABS 20.5* 13.1*  --   HGB 13.0 9.8* 10.0*  HCT 39.7 30.4* 31.5*  MCV 94.5 96.8 97.2  PLT 295 212 425   Basic Metabolic Panel: Recent Labs  Lab 02/03/21 0155 02/04/21 0539  NA 138 139  K 3.9 3.6  CL 100 108  CO2 26 23  GLUCOSE 93 88  BUN 14 13  CREATININE 0.88 0.69  CALCIUM 8.3* 7.3*   GFR: Estimated Creatinine Clearance: 69.8 mL/min (by C-G formula based on SCr of 0.69 mg/dL). Liver Function Tests: Recent Labs  Lab 02/03/21 0155  AST 35  ALT 25  ALKPHOS 93  BILITOT 1.1  PROT 6.5  ALBUMIN 2.6*   No results for input(s): LIPASE, AMYLASE in the last 168 hours. No results for input(s): AMMONIA in the last 168 hours. Coagulation Profile: Recent Labs  Lab 02/03/21 0155  INR 1.1   Cardiac Enzymes: No results for input(s): CKTOTAL, CKMB, CKMBINDEX, TROPONINI in the last 168 hours. BNP (last 3 results) No results for input(s): PROBNP in the last 8760 hours. HbA1C: No results for input(s): HGBA1C in the last 72 hours. CBG: No results for input(s): GLUCAP in the last 168 hours. Lipid Profile: Recent Labs    02/03/21 0527  TRIG 54   Thyroid Function  Tests: No results for input(s): TSH, T4TOTAL, FREET4, T3FREE, THYROIDAB in the last 72 hours. Anemia Panel: Recent Labs    02/03/21 0527  FERRITIN 282   Urine analysis:    Component Value Date/Time   COLORURINE AMBER (A) 02/03/2021 0300   APPEARANCEUR CLOUDY (A) 02/03/2021 0300   LABSPEC 1.024 02/03/2021 0300   PHURINE 5.0 02/03/2021 0300   GLUCOSEU NEGATIVE 02/03/2021 0300   HGBUR LARGE (A) 02/03/2021 0300   BILIRUBINUR NEGATIVE 02/03/2021 0300   BILIRUBINUR negative 12/07/2016 1850   BILIRUBINUR neg 07/29/2014 0906   KETONESUR 5 (A) 02/03/2021 0300   PROTEINUR 100 (A) 02/03/2021 0300   UROBILINOGEN 0.2 12/07/2016 1850   NITRITE NEGATIVE 02/03/2021 0300   LEUKOCYTESUR TRACE (A) 02/03/2021 0300   Sepsis Labs: @LABRCNTIP (procalcitonin:4,lacticidven:4)  ) Recent Results (from the past 240 hour(s))  MRSA PCR Screening     Status: None   Collection Time: 02/03/21  8:51 AM   Specimen: Nasal Mucosa; Nasopharyngeal  Result  Value Ref Range Status   MRSA by PCR NEGATIVE NEGATIVE Final    Comment:        The GeneXpert MRSA Assay (FDA approved for NASAL specimens only), is one component of a comprehensive MRSA colonization surveillance program. It is not intended to diagnose MRSA infection nor to guide or monitor treatment for MRSA infections. Performed at [facility], [place] [address] [place], [place] [address]   C Difficile Quick Screen w PCR reflex     Status: Abnormal   Collection Time: 02/03/21  3:20 PM   Specimen: STOOL  Result Value Ref Range Status   C Diff antigen POSITIVE (A) NEGATIVE Final   C Diff toxin POSITIVE (A) NEGATIVE Final   C Diff interpretation Toxin producing C. difficile detected.  Final    Comment: RBV TO JANEE Performed at [facility], [place] [address], [place], [place] [address]       Studies: No results found.  Scheduled Meds: . (feeding supplement) PROSource Plus  30 mL Oral BID BM  . vitamin C   500 mg Oral Daily  . chlorhexidine  15 mL Mouth Rinse BID  . Chlorhexidine Gluconate Cloth  6 each Topical Daily  . divalproex  500 mg Oral QHS  . feeding supplement  237 mL Oral BID BM  . Ipratropium-Albuterol  1 puff Inhalation Q6H  . [START ON 02/07/2021] levothyroxine  56 mcg Intravenous Daily  . mouth rinse  15 mL Mouth Rinse q12n4p  . multivitamin  15 mL Per Tube Daily  . traZODone  50 mg Oral QHS  . vancomycin (VANCOCIN) rectal ENEMA  500 mg Rectal Q6H  . zinc sulfate  220 mg Oral Daily    Continuous Infusions: . ceFEPime (MAXIPIME) IV Stopped (02/04/21 0516)  . heparin 1,500 Units/hr (02/04/21 1016)  . lactated ringers 100 mL/hr at 02/04/21 1100  . metronidazole Stopped (02/04/21 1696)     LOS: 1 day     Alma Friendly, MD [facility]  If 7PM-7AM, please contact night-coverage [email or web address] 02/04/2021, 1:21 PM

## 2021-02-04 NOTE — Progress Notes (Signed)
Noted pt given swallow eval with 30cc, directly from cup(no straw). Pt immediately began coughing and noted color change to a reddish pink for 1-2 min. Pt sat up to 45 degrees and mouth suctioned with yanker.. After 10 min pt was back to a resting state with no signs of distress or discomfort. Pt unable to take PO meds.On  going assessment

## 2021-02-04 NOTE — Evaluation (Addendum)
Clinical/Bedside Swallow Evaluation Patient Details  Name: Larry Cordova MRN: [id number] Date of Birth: May 20, 1930  Today's Date: 02/04/2021 Time: SLP Start Time (ACUTE ONLY): 1535 SLP Stop Time (ACUTE ONLY): 1555 SLP Time Calculation (min) (ACUTE ONLY): 20 min  Past Medical History:  Past Medical History:  Diagnosis Date  . Allergy   . Arthritis   . Depression   . Hypertension   . Kyphosis   . Spondylolisthesis 03/18/2017  . Thyroid disease   . Tinnitus    Past Surgical History:  Past Surgical History:  Procedure Laterality Date  . HERNIA REPAIR     HPI:  Larry Cordova is a 87 y.o. male with medical history significant for advanced dementia with behavioral disturbance, hypertension, hypothyroidism, urinary retention with Foley catheter, COVID-19 diagnosed on 01/18/2021, and recent hospitalization with acute renal failure and staph epidermidis bacteremia.   Assessment / Plan / Recommendation Clinical Impression  Pt was seen for a bedside swallow evaluation and he presents with a cognitive-based dysphagia.  Pt was encountered awake/alert in bed with bilateral mitts and soft wrist restraints in place.  Per chart review, pt has baseline dementia.  Pt was pleasantly confused during this evaluation, and he was noted to be verbose and impulsive.  Limited oral assessment and oral mechanism evaluation secondary to difficulty following commands, but no oral motor weakness observed.  Pt consumed trials of ice chips, thin liquid (straw), and puree given cueing.  Suspect delayed swallow initiation, but no overt s/sx of aspiration were observed with any trials.  Pt was observed to talk while consuming liquids and solids, so regular solids were not trialed on this date secondary to concerns for aspiration.  Recommend initiation of Dysphagia 1 (puree) solids and thin liquids with medications administered crushed in puree.  Additionally recommend full supervision with all meals to cue for compensatory  strategies.  SLP will f/u to monitor diet tolerance per POC.  SLP Visit Diagnosis: Dysphagia, unspecified (R13.10)    Aspiration Risk  Mild aspiration risk    Diet Recommendation Dysphagia 1 (Puree);Thin liquid   Liquid Administration via: Cup;Straw Medication Administration: Crushed with puree Supervision: Full supervision/cueing for compensatory strategies;Staff to assist with self feeding Compensations: Minimize environmental distractions;Slow rate;Small sips/bites Postural Changes: Seated upright at 90 degrees    Other  Recommendations Oral Care Recommendations: Oral care BID;Staff/trained caregiver to provide oral care Other Recommendations: Have oral suction available   Follow up Recommendations Skilled Nursing facility;24 hour supervision/assistance      Frequency and Duration min 2x/week  2 weeks       Prognosis Prognosis for Safe Diet Advancement: Fair Barriers to Reach Goals: Cognitive deficits      Swallow Study   General HPI: Larry Cordova is a 87 y.o. male with medical history significant for advanced dementia with behavioral disturbance, hypertension, hypothyroidism, urinary retention with Foley catheter, COVID-19 diagnosed on 01/18/2021, and recent hospitalization with acute renal failure and staph epidermidis bacteremia. Type of Study: Bedside Swallow Evaluation Previous Swallow Assessment: None Diet Prior to this Study: NPO Temperature Spikes Noted: Yes Respiratory Status: Room air History of Recent Intubation: No Oral Cavity Assessment: Within Functional Limits Oral Care Completed by SLP: No Oral Cavity - Dentition: Other (Comment) (Suspect missing dentition, difficulty to assess) Self-Feeding Abilities: Needs assist Patient Positioning: Partially reclined Baseline Vocal Quality: Normal Volitional Swallow: Unable to elicit    Oral/Motor/Sensory Function Overall Oral Motor/Sensory Function: Within functional limits   Ice Chips Ice chips: Within  functional limits Presentation: Spoon  Thin Liquid Thin Liquid: Within functional limits Presentation: Straw    Nectar Thick Nectar Thick Liquid: Not tested   Honey Thick Honey Thick Liquid: Not tested   Puree Puree: Within functional limits Presentation: Spoon   Solid     Solid: Not tested     Larry Cordova M.S., [place] Office: [phone]  Larry Cordova Larry Cordova 02/04/2021,4:11 PM

## 2021-02-04 NOTE — Progress Notes (Addendum)
[place] for IV heparin Indication: per CT, small age-indeterminate pulmonary emboli in RIGHT middle lobe and LEFT upper lobe pulmonary arteries.  No Known Allergies  Patient Measurements: Height: 6\' 2"  (188 cm) Weight: 77.3 kg (170 lb 6.7 oz) IBW/kg (Calculated) : 82.2 Heparin Dosing Weight: 77 kg  Vital Signs: Temp: 97.9 F (36.6 C) (02/26 0351) Temp Source: Axillary (02/26 0351) BP: 100/44 (02/26 0300) Pulse Rate: 70 (02/26 0300)  Labs: Recent Labs    02/03/21 0155 02/03/21 2033 02/04/21 0539  HGB 13.0  --  9.8*  HCT 39.7  --  30.4*  PLT 295  --  212  APTT 33  --   --   LABPROT 14.0  --   --   INR 1.1  --   --   HEPARINUNFRC  --  0.19* <0.10*  CREATININE 0.88  --  0.69    Estimated Creatinine Clearance: 69.8 mL/min (by C-G formula based on SCr of 0.69 mg/dL).   Assessment: 87 yo male with sepsis already started on IV abx's. Note patient admitted previously on 2/9 with COVID treated with remdesivir. Per CT, Small age-indeterminate pulmonary emboli in RIGHT middle lobe and LEFT upper lobe pulmonary arteries. To start IV heparin per Rx per Md orders. Baseline labs drawn. Note Lovenox 40mg  given at 0703.   02/04/2021: AM heparin level undetectable - third shift RN says heparin infusing well, repeat HL @ 0830 am remains undetectable. Day shift RN says heparin not hanging> this explains undetectable level Hg 13>10, PLT WNL Bleeding noted earlier at groin where patient pulled out foley- continues unchanged from admission per RN  Goal of Therapy:  Heparin level 0.3-0.7 units/ml Monitor platelets by anticoagulation protocol: Yes   Plan:   bolus 4000 units IV heparin bolus x1  resume IV heparin @1500  units/hr  Check heparin level 8 hours after IV heparin resumed  Daily CBC & heparin level  Eudelia Bunch, Pharm.D 02/04/2021 9:56 AM

## 2021-02-05 DIAGNOSIS — R338 Other retention of urine: Secondary | ICD-10-CM | POA: Diagnosis not present

## 2021-02-05 DIAGNOSIS — F0391 Unspecified dementia with behavioral disturbance: Secondary | ICD-10-CM | POA: Diagnosis not present

## 2021-02-05 DIAGNOSIS — A419 Sepsis, unspecified organism: Secondary | ICD-10-CM | POA: Diagnosis not present

## 2021-02-05 DIAGNOSIS — U071 COVID-19: Secondary | ICD-10-CM | POA: Diagnosis not present

## 2021-02-05 LAB — CBC WITH DIFFERENTIAL/PLATELET
Abs Immature Granulocytes: 0.13 10*3/uL — ABNORMAL HIGH (ref 0.00–0.07)
Basophils Absolute: 0.1 10*3/uL (ref 0.0–0.1)
Basophils Relative: 0 %
Eosinophils Absolute: 0.1 10*3/uL (ref 0.0–0.5)
Eosinophils Relative: 1 %
HCT: 29.7 % — ABNORMAL LOW (ref 39.0–52.0)
Hemoglobin: 9.7 g/dL — ABNORMAL LOW (ref 13.0–17.0)
Immature Granulocytes: 1 %
Lymphocytes Relative: 9 %
Lymphs Abs: 1 10*3/uL (ref 0.7–4.0)
MCH: 30.9 pg (ref 26.0–34.0)
MCHC: 32.7 g/dL (ref 30.0–36.0)
MCV: 94.6 fL (ref 80.0–100.0)
Monocytes Absolute: 0.8 10*3/uL (ref 0.1–1.0)
Monocytes Relative: 7 %
Neutro Abs: 9.8 10*3/uL — ABNORMAL HIGH (ref 1.7–7.7)
Neutrophils Relative %: 82 %
Platelets: 248 10*3/uL (ref 150–400)
RBC: 3.14 MIL/uL — ABNORMAL LOW (ref 4.22–5.81)
RDW: 13.3 % (ref 11.5–15.5)
WBC: 12 10*3/uL — ABNORMAL HIGH (ref 4.0–10.5)
nRBC: 0 % (ref 0.0–0.2)

## 2021-02-05 LAB — BASIC METABOLIC PANEL
Anion gap: <3 — ABNORMAL LOW (ref 5–15)
BUN: 9 mg/dL (ref 8–23)
CO2: 22 mmol/L (ref 22–32)
Calcium: 6.7 mg/dL — ABNORMAL LOW (ref 8.9–10.3)
Chloride: 113 mmol/L — ABNORMAL HIGH (ref 98–111)
Creatinine, Ser: 0.75 mg/dL (ref 0.61–1.24)
GFR, Estimated: >60 mL/min (ref >60)
Glucose, Bld: 119 mg/dL — ABNORMAL HIGH (ref 70–99)
Potassium: 2.4 mmol/L — CL (ref 3.5–5.1)
Sodium: 137 mmol/L (ref 135–145)

## 2021-02-05 LAB — URINE CULTURE: Culture: NO GROWTH

## 2021-02-05 LAB — MAGNESIUM: Magnesium: 1.4 mg/dL — ABNORMAL LOW (ref 1.7–2.4)

## 2021-02-05 LAB — PROCALCITONIN: Procalcitonin: 0.16 ng/mL

## 2021-02-05 LAB — C-REACTIVE PROTEIN: CRP: 11 mg/dL — ABNORMAL HIGH (ref <1.0)

## 2021-02-05 LAB — HEPARIN LEVEL (UNFRACTIONATED): Heparin Unfractionated: <0.1 IU/mL — ABNORMAL LOW (ref 0.30–0.70)

## 2021-02-05 MED ORDER — APIXABAN 5 MG PO TABS: 5.0000 mg | ORAL_TABLET | Freq: Two times a day (BID) | ORAL | Status: DC

## 2021-02-05 MED ORDER — POTASSIUM CHLORIDE 10 MEQ/100ML IV SOLN
10.0000 meq | INTRAVENOUS | Status: AC
  Administered 2021-02-05 (×4): 10 meq via INTRAVENOUS
  Filled 2021-02-05 (×4): qty 100

## 2021-02-05 MED ORDER — POTASSIUM CHLORIDE 20 MEQ PO PACK
40.0000 meq | PACK | Freq: Once | ORAL | Status: AC
  Administered 2021-02-05: 40 meq via ORAL
  Filled 2021-02-05: qty 2

## 2021-02-05 MED ORDER — ADULT MULTIVITAMIN W/MINERALS CH
1.0000 | ORAL_TABLET | Freq: Every day | ORAL | Status: DC
  Administered 2021-02-05 – 2021-02-09 (×5): 1 via ORAL
  Filled 2021-02-05 (×5): qty 1

## 2021-02-05 MED ORDER — MAGNESIUM SULFATE 4 GM/100ML IV SOLN
4.0000 g | Freq: Once | INTRAVENOUS | Status: AC
  Administered 2021-02-05: 4 g via INTRAVENOUS
  Filled 2021-02-05: qty 100

## 2021-02-05 MED ORDER — CALCIUM GLUCONATE-NACL 1-0.675 GM/50ML-% IV SOLN
1.0000 g | Freq: Once | INTRAVENOUS | Status: AC
  Administered 2021-02-05: 1000 mg via INTRAVENOUS
  Filled 2021-02-05: qty 50

## 2021-02-05 MED ORDER — LEVOTHYROXINE SODIUM 112 MCG PO TABS
112.0000 ug | ORAL_TABLET | Freq: Every day | ORAL | Status: DC
  Administered 2021-02-05 – 2021-02-10 (×6): 112 ug via ORAL
  Filled 2021-02-05 (×6): qty 1

## 2021-02-05 MED ORDER — APIXABAN 5 MG PO TABS
10.0000 mg | ORAL_TABLET | Freq: Two times a day (BID) | ORAL | Status: DC
  Administered 2021-02-05 – 2021-02-10 (×11): 10 mg via ORAL
  Filled 2021-02-05 (×11): qty 2

## 2021-02-05 MED ORDER — POTASSIUM CHLORIDE 10 MEQ/100ML IV SOLN: 10.0000 meq | INTRAVENOUS | Status: DC

## 2021-02-05 MED ORDER — HEPARIN BOLUS VIA INFUSION
4000.0000 [IU] | INTRAVENOUS | Status: DC
  Filled 2021-02-05: qty 4000

## 2021-02-05 MED ORDER — POTASSIUM CHLORIDE CRYS ER 20 MEQ PO TBCR: 40.0000 meq | EXTENDED_RELEASE_TABLET | Freq: Once | ORAL | Status: DC

## 2021-02-05 NOTE — Progress Notes (Signed)
OT Cancellation Note  Patient Details Name: Larry Cordova MRN: [id number] DOB: 1930/04/26   Cancelled Treatment:    Reason Eval/Treat Not Completed: Medical issues which prohibited therapy. Critical potassium and Rn asking therapy to hold as patient is asleep. Will f/u as able.  Lenward Chancellor 02/05/2021, 12:37 PM

## 2021-02-05 NOTE — Progress Notes (Signed)
PROGRESS NOTE  Larry Cordova FWY:[id number] DOB: 07/16/1930 DOA: 02/03/2021 PCP: [name], MD  HPI/Recap of past 24 hours: HPI from Dr Larry Cordova is a 87 y.o. male with medical history significant for advanced dementia with behavioral disturbance, HTN, hypothyroidism, urinary retention with Foley catheter, COVID-19 diagnosed on 01/18/2021, and recent hospitalization with acute renal failure and staph epidermidis bacteremia, now presenting to the ED for evaluation of Foley catheter problem. The patient was brought in by his wife after he appeared to have pulled his Foley catheter part of the way out.  Family noted some blood in the catheter.  Patient is unable to contribute to the history due to his dementia. Discussed with wife who noted that patient has been more confused than his baseline, with poor appetite and has been having diarrhea since his discharge from hospital.  In the ED, patient is found to be in rigors with temperature 102.7, tachypnea, tachycardia, and hypotension. Chest x-ray demonstrates increased regions of patchy and ill-defined opacity. CBC notable for leukocytosis of 24,200.  Lactic acid 2.1.  Blood and urine culture were collected. Patient was given 30 cc/kg LR, acetaminophen, vancomycin, cefepime, and Flagyl.  Patient admitted for further management.    Today, patient awake, alert, muffled speech at baseline, difficult to understand his conversations.  Looks comfortable.  Improved appetite.    Assessment/Plan: Principal Problem:   Sepsis ([place]) Active Problems:   Hypothyroidism   HTN (hypertension)   Dementia with behavioral disturbance ([place])   Acute urinary retention   COVID-19 virus infection   Pressure injury of skin   Sepsis 2/2 multifocal pneumonia/HCAP C. difficile colitis infection On presentation, noted to have fever, tachycardia, hypotension, marked leukocytosis, and mild elevation in lactate  UA with large hemoglobin, trace leukocytes,  11-20 WBC UC no growth and BC x2 NGTD Lactic acid trended down Procalcitonin trended down CXR possible multifocal pneumonia or may be related to recent covid infection CTA chest done showed multifocal pneumonia Continue cefepime, Flagyl, discontinue IV vancomycin D/C IV fluids Monitor closely  C. difficile colitis infection Noted watery stool, ongoing rectal tube C. Difficile positive, GI panel negative Continue IV flagyl, Vancomycin  COVID-19 infection Diagnosed 01/18/21  Completed remdesivir during last admission Currently saturating well on room air, no indication yet to start steroids Plan to continue isolation through 02/08/21  Monitor closely for change in oxygen requirement  Acute pulmonary embolism Small age indeterminant PE in the right middle lobe and left upper lobe on CTA chest Bilateral Doppler lower extremity negative for DVT Start eliquis  Dementia  Continue Depakote qHS once able Supportive care with delirium precautions    Hypothyroidism  Continue Synthroid    Urinary retention  Held Flomax, Foley care      Estimated body mass index is 21.88 kg/m as calculated from the following:   Height as of this encounter: 6\' 2"  (1.88 m).   Weight as of this encounter: 77.3 kg.     Code Status: DNR, discussed extensively with wife on 02/03/2021  Family Communication: Discussed with wife on 02/04/2021  Disposition Plan: Status is: Inpatient  Remains inpatient appropriate because:Inpatient level of care appropriate due to severity of illness   Dispo: The patient is from: Home              Anticipated d/c is to: Home vs SNF              Patient currently is not medically stable to d/c.   Difficult to  place patient No    Consultants:  None  Procedures:  None  Antimicrobials:  Cefepime  Flagyl  Vancomycin per rectum  DVT prophylaxis: Eliquis   Objective: Vitals:   02/05/21 1200 02/05/21 1344 02/05/21 1400 02/05/21 1500  BP: 131/67      Pulse: 72  81 82  Resp: 18  (!) 25 20  Temp:  97.7 F (36.5 C)    TempSrc:  Axillary    SpO2: 99%  95% 96%  Weight:      Height:        Intake/Output Summary (Last 24 hours) at 02/05/2021 1614 Last data filed at 02/05/2021 1316 Gross per 24 hour  Intake 2641.23 ml  Output 950 ml  Net 1691.23 ml   Filed Weights   02/03/21 0129  Weight: 77.3 kg    Exam:  General: NAD, muffled speech, alert, awake, not oriented  Cardiovascular: S1, S2 present  Respiratory: CTAB  Abdomen: Soft, nontender, nondistended, bowel sounds present  Musculoskeletal: No bilateral pedal edema noted  Skin: Normal  Psychiatry:  Unable to assess   Data Reviewed: CBC: Recent Labs  Lab 02/03/21 0155 02/04/21 0539 02/04/21 0831 02/05/21 0610  WBC 24.2* 16.1* 16.2* 12.0*  NEUTROABS 20.5* 13.1*  --  9.8*  HGB 13.0 9.8* 10.0* 9.7*  HCT 39.7 30.4* 31.5* 29.7*  MCV 94.5 96.8 97.2 94.6  PLT 295 212 233 211   Basic Metabolic Panel: Recent Labs  Lab 02/03/21 0155 02/04/21 0539 02/05/21 0556 02/05/21 0610  NA 138 139  --  137  K 3.9 3.6  --  2.4*  CL 100 108  --  113*  CO2 26 23  --  22  GLUCOSE 93 88  --  119*  BUN 14 13  --  9  CREATININE 0.88 0.69  --  0.75  CALCIUM 8.3* 7.3*  --  6.7*  MG  --   --  1.4*  --    GFR: Estimated Creatinine Clearance: 69.8 mL/min (by C-G formula based on SCr of 0.75 mg/dL). Liver Function Tests: Recent Labs  Lab 02/03/21 0155  AST 35  ALT 25  ALKPHOS 93  BILITOT 1.1  PROT 6.5  ALBUMIN 2.6*   No results for input(s): LIPASE, AMYLASE in the last 168 hours. No results for input(s): AMMONIA in the last 168 hours. Coagulation Profile: Recent Labs  Lab 02/03/21 0155  INR 1.1   Cardiac Enzymes: No results for input(s): CKTOTAL, CKMB, CKMBINDEX, TROPONINI in the last 168 hours. BNP (last 3 results) No results for input(s): PROBNP in the last 8760 hours. HbA1C: No results for input(s): HGBA1C in the last 72 hours. CBG: No results for  input(s): GLUCAP in the last 168 hours. Lipid Profile: Recent Labs    02/03/21 0527  TRIG 54   Thyroid Function Tests: No results for input(s): TSH, T4TOTAL, FREET4, T3FREE, THYROIDAB in the last 72 hours. Anemia Panel: Recent Labs    02/03/21 0527  FERRITIN 282   Urine analysis:    Component Value Date/Time   COLORURINE AMBER (A) 02/03/2021 0300   APPEARANCEUR CLOUDY (A) 02/03/2021 0300   LABSPEC 1.024 02/03/2021 0300   PHURINE 5.0 02/03/2021 0300   GLUCOSEU NEGATIVE 02/03/2021 0300   HGBUR LARGE (A) 02/03/2021 0300   BILIRUBINUR NEGATIVE 02/03/2021 0300   BILIRUBINUR negative 12/07/2016 1850   BILIRUBINUR neg 07/29/2014 0906   KETONESUR 5 (A) 02/03/2021 0300   PROTEINUR 100 (A) 02/03/2021 0300   UROBILINOGEN 0.2 12/07/2016 1850   NITRITE NEGATIVE 02/03/2021 0300  LEUKOCYTESUR TRACE (A) 02/03/2021 0300   Sepsis Labs: @LABRCNTIP (procalcitonin:4,lacticidven:4)  ) Recent Results (from the past 240 hour(s))  Blood culture (routine single)     Status: None (Preliminary result)   Collection Time: 02/03/21  1:34 AM   Specimen: BLOOD  Result Value Ref Range Status   Specimen Description   Final    BLOOD LEFT ANTECUBITAL Performed at [facility] [address] [place], [place] [address]    Special Requests   Final    BOTTLES DRAWN AEROBIC AND ANAEROBIC Blood Culture adequate volume Performed at [facility] [address] [place], [place] [address]    Culture   Final    NO GROWTH 2 DAYS Performed at [facility] [address], [place], [place] [address]    Report Status PENDING  Incomplete  Urine culture     Status: None   Collection Time: 02/03/21  3:00 AM   Specimen: In/Out Cath Urine  Result Value Ref Range Status   Specimen Description   Final    IN/OUT CATH URINE Performed at [facility] [address] [place], [place] [address]    Special Requests   Final    NONE Performed at  [facility], [place] [address], [place], [place] [address]    Culture   Final    NO GROWTH Performed at [facility], [place] [address] [place], [place] [address]    Report Status 02/05/2021 FINAL  Final  MRSA PCR Screening     Status: None   Collection Time: 02/03/21  8:51 AM   Specimen: Nasal Mucosa; Nasopharyngeal  Result Value Ref Range Status   MRSA by PCR NEGATIVE NEGATIVE Final    Comment:        The GeneXpert MRSA Assay (FDA approved for NASAL specimens only), is one component of a comprehensive MRSA colonization surveillance program. It is not intended to diagnose MRSA infection nor to guide or monitor treatment for MRSA infections. Performed at [facility], [place] [address], [place], [place] [address]   Culture, blood (single)     Status: None (Preliminary result)   Collection Time: 02/03/21  9:41 AM   Specimen: BLOOD  Result Value Ref Range Status   Specimen Description   Final    BLOOD RIGHT ANTECUBITAL Performed at [facility] [address] [place], [place] [address]    Special Requests   Final    BOTTLES DRAWN AEROBIC ONLY Blood Culture adequate volume Performed at [facility] [address], [place], [place] [address]    Culture   Final    NO GROWTH 2 DAYS Performed at [facility] [address], [place], [place] [address]    Report Status PENDING  Incomplete  Culture, blood (x 2)     Status: None (Preliminary result)   Collection Time: 02/03/21  9:41 AM   Specimen: BLOOD  Result Value Ref Range Status   Specimen Description   Final    BLOOD LEFT ANTECUBITAL Performed at [facility] [address] [place], [place] [address]    Special Requests   Final    BOTTLES DRAWN AEROBIC ONLY LEFT ANTECUBITAL Performed at [facility] [address], [place], [place] [address]    Culture   Final    NO GROWTH 2 DAYS Performed at  [facility] [address] [place], [place] [address]  Report Status PENDING  Incomplete  C Difficile Quick Screen w PCR reflex     Status: Abnormal   Collection Time: 02/03/21  3:20 PM   Specimen: STOOL  Result Value Ref Range Status   C Diff antigen POSITIVE (A) NEGATIVE Final   C Diff toxin POSITIVE (A) NEGATIVE Final   C Diff interpretation Toxin producing C. difficile detected.  Final    Comment: RBV TO JANEE Performed at [facility], [place] [address] [place], [place] [address]   Gastrointestinal Panel by PCR , Stool     Status: None   Collection Time: 02/03/21  3:20 PM   Specimen: STOOL  Result Value Ref Range Status   Campylobacter species NOT DETECTED NOT DETECTED Final   Plesimonas shigelloides NOT DETECTED NOT DETECTED Final   Salmonella species NOT DETECTED NOT DETECTED Final   Yersinia enterocolitica NOT DETECTED NOT DETECTED Final   Vibrio species NOT DETECTED NOT DETECTED Final   Vibrio cholerae NOT DETECTED NOT DETECTED Final   Enteroaggregative E coli (EAEC) NOT DETECTED NOT DETECTED Final   Enteropathogenic E coli (EPEC) NOT DETECTED NOT DETECTED Final   Enterotoxigenic E coli (ETEC) NOT DETECTED NOT DETECTED Final   Shiga like toxin producing E coli (STEC) NOT DETECTED NOT DETECTED Final   Shigella/Enteroinvasive E coli (EIEC) NOT DETECTED NOT DETECTED Final   Cryptosporidium NOT DETECTED NOT DETECTED Final   Cyclospora cayetanensis NOT DETECTED NOT DETECTED Final   Entamoeba histolytica NOT DETECTED NOT DETECTED Final   Giardia lamblia NOT DETECTED NOT DETECTED Final   Adenovirus F40/41 NOT DETECTED NOT DETECTED Final   Astrovirus NOT DETECTED NOT DETECTED Final   Norovirus GI/GII NOT DETECTED NOT DETECTED Final   Rotavirus A NOT DETECTED NOT DETECTED Final   Sapovirus (I, II, IV, and V) NOT DETECTED NOT DETECTED Final    Comment: Performed at [facility], [address] [place], [place] [address]      Studies: No  results found.  Scheduled Meds: . (feeding supplement) PROSource Plus  30 mL Oral BID BM  . apixaban  10 mg Oral BID   Followed by  . [START ON 02/12/2021] apixaban  5 mg Oral BID  . vitamin C  500 mg Oral Daily  . chlorhexidine  15 mL Mouth Rinse BID  . Chlorhexidine Gluconate Cloth  6 each Topical Daily  . divalproex  500 mg Oral QHS  . feeding supplement  237 mL Oral BID BM  . Ipratropium-Albuterol  1 puff Inhalation Q6H  . levothyroxine  112 mcg Oral Q0600  . mouth rinse  15 mL Mouth Rinse q12n4p  . multivitamin with minerals  1 tablet Oral Daily  . traZODone  50 mg Oral QHS  . vancomycin (VANCOCIN) rectal ENEMA  500 mg Rectal Q6H  . zinc sulfate  220 mg Oral Daily    Continuous Infusions: . ceFEPime (MAXIPIME) IV Stopped (02/05/21 1513)  . magnesium sulfate bolus IVPB    . metronidazole Stopped (02/05/21 1418)     LOS: 2 days     Alma Friendly, MD [facility]  If 7PM-7AM, please contact night-coverage [email or web address] 02/05/2021, 4:14 PM

## 2021-02-05 NOTE — Discharge Instructions (Signed)
Information on my medicine - ELIQUIS (apixaban)  This medication education was reviewed with me or my healthcare representative as part of my discharge preparation Why was Eliquis prescribed for you? Eliquis was prescribed to treat blood clots that may have been found in your lungs (pulmonary embolism) and to reduce the risk of them occurring again.  What do You need to know about Eliquis ? The starting dose is 10 mg (two 5 mg tablets) taken TWICE daily for the FIRST SEVEN (7) DAYS, then on Sunday 02/12/2021  the dose is reduced to ONE 5 mg tablet taken TWICE daily.  Eliquis may be taken with or without food.   Try to take the dose about the same time in the morning and in the evening. If you have difficulty swallowing the tablet whole please discuss with your pharmacist how to take the medication safely.  Take Eliquis exactly as prescribed and DO NOT stop taking Eliquis without talking to the doctor who prescribed the medication.  Stopping may increase your risk of developing a new blood clot.  Refill your prescription before you run out.  After discharge, you should have regular check-up appointments with your healthcare provider that is prescribing your Eliquis.    What do you do if you miss a dose? If a dose of ELIQUIS is not taken at the scheduled time, take it as soon as possible on the same day and twice-daily administration should be resumed. The dose should not be doubled to make up for a missed dose.  Important Safety Information A possible side effect of Eliquis is bleeding. You should call your healthcare provider right away if you experience any of the following: ? Bleeding from an injury or your nose that does not stop. ? Unusual colored urine (red or dark brown) or unusual colored stools (red or black). ? Unusual bruising for unknown reasons. ? A serious fall or if you hit your head (even if there is no bleeding).  Some medicines may interact with Eliquis and might  increase your risk of bleeding or clotting while on Eliquis. To help avoid this, consult your healthcare provider or pharmacist prior to using any new prescription or non-prescription medications, including herbals, vitamins, non-steroidal anti-inflammatory drugs (NSAIDs) and supplements.  This website has more information on Eliquis (apixaban): http://[email or web address].com/eliquis/home

## 2021-02-05 NOTE — Progress Notes (Addendum)
[place] for IV heparin>> Eliquis Indication: per CT, small age-indeterminate pulmonary emboli in RIGHT middle lobe and LEFT upper lobe pulmonary arteries.  No Known Allergies  Patient Measurements: Height: 6\' 2"  (188 cm) Weight: 77.3 kg (170 lb 6.7 oz) IBW/kg (Calculated) : 82.2 Heparin Dosing Weight: 77 kg  Vital Signs: Temp: 97.8 F (36.6 C) (02/27 0400) Temp Source: Axillary (02/27 0400) BP: 127/66 (02/27 0400) Pulse Rate: 83 (02/27 0600)  Labs: Recent Labs    02/03/21 0155 02/03/21 2033 02/04/21 0539 02/04/21 0831 02/04/21 1812 02/05/21 0610  HGB 13.0  --  9.8* 10.0*  --  9.7*  HCT 39.7  --  30.4* 31.5*  --  29.7*  PLT 295  --  212 233  --  248  APTT 33  --   --   --   --   --   LABPROT 14.0  --   --   --   --   --   INR 1.1  --   --   --   --   --   HEPARINUNFRC  --    < > <0.10* <0.10* 0.18* <0.10*  CREATININE 0.88  --  0.69  --   --  0.75   < > = values in this interval not displayed.    Estimated Creatinine Clearance: 69.8 mL/min (by C-G formula based on SCr of 0.75 mg/dL).   Assessment: 87 yo male with sepsis already started on IV abx's. Note patient admitted previously on 2/9 with COVID treated with remdesivir. Per CT, Small age-indeterminate pulmonary emboli in RIGHT middle lobe and LEFT upper lobe pulmonary arteries. To start IV heparin per Rx per Md orders. Baseline labs drawn. Note Lovenox 40mg  given at 0703.   02/05/2021: AM heparin level undetectable after 2000 unit bolus and drip increased to 1750 units/hr - third shift RN says heparin infusing well currently but was paused x 3 last night for around 10 minutes each & IV was replaced 3 times last night.  She didn't think it was paused enough to cause level to be undetectable but will assume this is the cause of low level. Hg 13>10>9.7, PLT WNL Bleeding at groin where patient pulled out foley PTA has resolved per RN.  No other bleeding noted.  To transition to  Eliquis  Goal of Therapy:  Heparin level 0.3-0.7 units/ml Monitor platelets by anticoagulation protocol: Yes   Plan:  DC heparin drip Eliquis 10 mg po BID x 7 days followed by Eliquis 5 mg po BID OK to crush and give with applesauce Will educate family prior to discharge & provide 30 day card  Eudelia Bunch, Pharm.D 02/05/2021 7:19 AM

## 2021-02-05 NOTE — Progress Notes (Signed)
Informed by hospital [facility] that this patient will not be moved tonight. Informed bedside RN.

## 2021-02-05 NOTE — Progress Notes (Signed)
PT Cancellation Note  Patient Details Name: Larry Cordova MRN: [id number] DOB: 07-03-1930   Cancelled Treatment:    Reason Eval/Treat Not Completed: Medical issues which prohibited therapy;Other (comment);Patient's level of consciousness (Pt's K+ 2.4 today, RN reports he has been recieving K+ via IV, but is resting asleep. Will hold off until pt is medically ready and able to participate. Will follow up at later date/time.)  Verner Mould, DPT Acute Rehabilitation Services Office [phone] Pager [phone]    Larry Cordova 02/05/2021, 12:37 PM

## 2021-02-06 ENCOUNTER — Telehealth: Payer: Medicare Other | Admitting: Family Medicine

## 2021-02-06 DIAGNOSIS — R338 Other retention of urine: Secondary | ICD-10-CM | POA: Diagnosis not present

## 2021-02-06 DIAGNOSIS — F0391 Unspecified dementia with behavioral disturbance: Secondary | ICD-10-CM | POA: Diagnosis not present

## 2021-02-06 DIAGNOSIS — A419 Sepsis, unspecified organism: Secondary | ICD-10-CM | POA: Diagnosis not present

## 2021-02-06 DIAGNOSIS — U071 COVID-19: Secondary | ICD-10-CM | POA: Diagnosis not present

## 2021-02-06 LAB — CBC WITH DIFFERENTIAL/PLATELET
Abs Immature Granulocytes: 0.07 10*3/uL (ref 0.00–0.07)
Basophils Absolute: 0.1 10*3/uL (ref 0.0–0.1)
Basophils Relative: 1 %
Eosinophils Absolute: 0.3 10*3/uL (ref 0.0–0.5)
Eosinophils Relative: 2 %
HCT: 31.5 % — ABNORMAL LOW (ref 39.0–52.0)
Hemoglobin: 10.5 g/dL — ABNORMAL LOW (ref 13.0–17.0)
Immature Granulocytes: 1 %
Lymphocytes Relative: 10 %
Lymphs Abs: 1.2 10*3/uL (ref 0.7–4.0)
MCH: 31.1 pg (ref 26.0–34.0)
MCHC: 33.3 g/dL (ref 30.0–36.0)
MCV: 93.2 fL (ref 80.0–100.0)
Monocytes Absolute: 1.1 10*3/uL — ABNORMAL HIGH (ref 0.1–1.0)
Monocytes Relative: 9 %
Neutro Abs: 9.3 10*3/uL — ABNORMAL HIGH (ref 1.7–7.7)
Neutrophils Relative %: 77 %
Platelets: 288 10*3/uL (ref 150–400)
RBC: 3.38 MIL/uL — ABNORMAL LOW (ref 4.22–5.81)
RDW: 13.3 % (ref 11.5–15.5)
WBC: 12 10*3/uL — ABNORMAL HIGH (ref 4.0–10.5)
nRBC: 0 % (ref 0.0–0.2)

## 2021-02-06 LAB — BASIC METABOLIC PANEL
Anion gap: 9 (ref 5–15)
BUN: 9 mg/dL (ref 8–23)
CO2: 24 mmol/L (ref 22–32)
Calcium: 7.9 mg/dL — ABNORMAL LOW (ref 8.9–10.3)
Chloride: 108 mmol/L (ref 98–111)
Creatinine, Ser: 0.55 mg/dL — ABNORMAL LOW (ref 0.61–1.24)
GFR, Estimated: >60 mL/min (ref >60)
Glucose, Bld: 132 mg/dL — ABNORMAL HIGH (ref 70–99)
Potassium: 3.8 mmol/L (ref 3.5–5.1)
Sodium: 141 mmol/L (ref 135–145)

## 2021-02-06 LAB — C-REACTIVE PROTEIN: CRP: 8.4 mg/dL — ABNORMAL HIGH (ref <1.0)

## 2021-02-06 LAB — MAGNESIUM: Magnesium: 2.3 mg/dL (ref 1.7–2.4)

## 2021-02-06 NOTE — TOC Initial Note (Signed)
Transition of Care [facility]) - Initial/Assessment Note    Patient Details  Name: Larry Cordova MRN: [id number] Date of Birth: February 13, 1930  Transition [facility]) CM/SW Contact:    Leeroy Cha, RN Phone Number: 02/06/2021, 8:41 AM  Clinical Narrative:                 87 y.o. male with medical history significant for advanced dementia with behavioral disturbance, hypertension, hypothyroidism, urinary retention with Foley catheter, COVID-19 diagnosed on 01/18/2021, and recent hospitalization with acute renal failure and staph epidermidis bacteremia, now presenting to the emergency department for evaluation of Foley catheter problem.  The patient was brought in by his wife after he appeared to have pulled his Foley catheter part of the way out.  Family noted some blood in the catheter.  Patient is unable to contribute to the history due to his dementia and family is not able to be reached at time of admission.  ED Course: Upon arrival to the ED, patient is found to be in rigors with temperature 39.4, mild tachypnea and tachycardia, and hypotension.  EKG features ectopic atrial or sinus tachycardia.  Chest x-ray demonstrates increased regions of patchy and ill-defined opacity.  Chemistry panel with albumin of 2.6.  CBC notable for leukocytosis of 24,200.  Lactic acid 2.1.  Blood and urine culture were collected in the emergency department and the patient was given 30 cc/kg LR, acetaminophen, vancomycin, cefepime, and Flagyl. PLAN: to return to home with hhc RN&PT through [facility] Expected Discharge Plan: [place] Barriers to Discharge: Continued Medical Work up   Patient Goals and CMS Choice Patient states their goals for this hospitalization and ongoing recovery are:: unable to state      Expected Discharge Plan and Services Expected Discharge Plan: [place]   Discharge Planning Services: CM Consult   Living arrangements for the past 2 months:  Single Family Home Expected Discharge Date:  (unknown)                                    Prior Living Arrangements/Services Living arrangements for the past 2 months: Single Family Home Lives with:: Spouse              Current home services: Home PT,Home RN ([facility])    Activities of Daily Living Home Assistive Devices/Equipment: Hospital bed,Wheelchair,Other (Comment) (foley catheter, seat cushion for wheelchair) ADL Screening (condition at time of admission) Patient's cognitive ability adequate to safely complete daily activities?: No Is the patient deaf or have difficulty hearing?: Yes Does the patient have difficulty seeing, even when wearing glasses/contacts?: No Does the patient have difficulty concentrating, remembering, or making decisions?: Yes Patient able to express need for assistance with ADLs?: Yes Does the patient have difficulty dressing or bathing?: Yes Independently performs ADLs?: No Communication: Independent Dressing (OT): Dependent Is this a change from baseline?: Pre-admission baseline Grooming: Dependent Is this a change from baseline?: Pre-admission baseline Feeding: Dependent Is this a change from baseline?: Pre-admission baseline Bathing: Dependent Is this a change from baseline?: Pre-admission baseline Toileting: Dependent Is this a change from baseline?: Pre-admission baseline In/Out Bed: Dependent Is this a change from baseline?: Pre-admission baseline Walks in Home: Dependent Is this a change from baseline?: Pre-admission baseline Does the patient have difficulty walking or climbing stairs?: Yes (secondary to weakness) Weakness of Legs: Both Weakness of Arms/Hands: Both  Permission  Sought/Granted                  Emotional Assessment Appearance:: Appears stated age Attitude/Demeanor/Rapport: Unable to Assess Affect (typically observed): Unable to Assess Orientation: : Fluctuating Orientation (Suspected and/or reported  Sundowners) Alcohol / Substance Use: Not Applicable Psych Involvement: No (comment)  Admission diagnosis:  Sepsis ([place]) [A41.9] Sepsis with encephalopathy without septic shock, due to unspecified organism ([place]) [A41.9, R65.20, G93.40] Patient Active Problem List   Diagnosis Date Noted  . Sepsis ([place]) 02/03/2021  . Pressure injury of skin 02/03/2021  . Elevated troponin level not due myocardial infarction 01/19/2021  . COVID-19 virus infection 01/19/2021  . AKI (acute kidney injury) ([place]) 01/18/2021  . Leukocytosis 01/18/2021  . Dehydration with hyponatremia 01/18/2021  . Acute urinary retention 01/18/2021  . Lactic acidosis 01/18/2021  . PAD (peripheral artery disease) ([place]) 08/18/2019  . Dementia with behavioral disturbance ([place]) 05/18/2019  . Spondylolisthesis 03/18/2017  . Frontotemporal dementia ([place]) 01/03/2017  . Skin cancer 07/29/2014  . Other and unspecified hyperlipidemia 07/29/2014  . Arthritis 07/13/2013  . Hypothyroidism 06/16/2012  . HTN (hypertension) 06/16/2012   PCP:  [name], MD Pharmacy:   [facility] [place], [place] [address] Phone: [phone] Fax: [phone]  [id number] - [place], [place] - [address] [place] [address] Phone: [phone] Fax: [phone]     Social Determinants of Health (SDOH) Interventions    Readmission Risk Interventions No flowsheet data found.

## 2021-02-06 NOTE — Progress Notes (Signed)
PT Cancellation Note  Patient Details Name: Larry Cordova MRN: [id number] DOB: [birth year]   Cancelled Treatment:    Reason Eval/Treat Not Completed: PT screened, no needs identified, will sign off;OT screened, no needs identified, will sign off (Per OT note pt is currently TOTAL care and has been aggitated not following commands/cues, requiring restraints. He is not an appropriate candidate for skilled PT services. PT will sign off at this time. Please reconsult if there is a change in status.)  Verner Mould, DPT Acute Rehabilitation Services Office [phone] Pager [phone]    Jacques Navy 02/06/2021, 1:22 PM

## 2021-02-06 NOTE — Progress Notes (Addendum)
PROGRESS NOTE  Larry Cordova WUJ:[id number] DOB: Apr 15, 1930 DOA: 02/03/2021 PCP: [name], MD  HPI/Recap of past 24 hours: HPI from Dr Martha Clan R Zuccaro is a 87 y.o. male with medical history significant for advanced dementia with behavioral disturbance, HTN, hypothyroidism, urinary retention with Foley catheter, COVID-19 diagnosed on 01/18/2021, and recent hospitalization with acute renal failure and staph epidermidis bacteremia, now presenting to the ED for evaluation of Foley catheter problem. The patient was brought in by his wife after he appeared to have pulled his Foley catheter part of the way out.  Family noted some blood in the catheter.  Patient is unable to contribute to the history due to his dementia. Discussed with wife who noted that patient has been more confused than his baseline, with poor appetite and has been having diarrhea since his discharge from hospital.  In the ED, patient is found to be in rigors with temperature 102.7, tachypnea, tachycardia, and hypotension. Chest x-ray demonstrates increased regions of patchy and ill-defined opacity. CBC notable for leukocytosis of 24,200.  Lactic acid 2.1.  Blood and urine culture were collected. Patient was given 30 cc/kg LR, acetaminophen, vancomycin, cefepime, and Flagyl.  Patient admitted for further management.    Today, patient awake, muffled voice, difficult to understand, appears comfortable.    Assessment/Plan: Principal Problem:   Sepsis ([place]) Active Problems:   Hypothyroidism   HTN (hypertension)   Dementia with behavioral disturbance ([place])   Acute urinary retention   COVID-19 virus infection   Pressure injury of skin   Severe sepsis 2/2 multifocal pneumonia/HCAP C. difficile colitis infection On presentation, noted to have fever, tachycardia, hypotension, marked leukocytosis, and mild elevation in lactate  UA with large hemoglobin, trace leukocytes, 11-20 WBC UC no growth and BC x2 NGTD Lactic  acid trended down Procalcitonin trended down CXR with multifocal pneumonia likely from HCAP, unable to determine if associated with Covid, but unlikely CTA chest done showed multifocal pneumonia Continue cefepime may d/c after 5 days, Flagyl D/C IV fluids Monitor closely  C. difficile colitis infection Noted watery stool, ongoing rectal tube C. Difficile positive, GI panel negative Continue IV flagyl, Vancomycin  COVID-19 infection Diagnosed 01/18/21  Completed remdesivir during last admission Currently saturating well on room air, no indication yet to start steroids Plan to continue isolation through 02/08/21  Monitor closely for change in oxygen requirement  Acute pulmonary embolism Small age indeterminant PE in the right middle lobe and left upper lobe on CTA chest Bilateral Doppler lower extremity negative for DVT Started on eliquis, continue  Dementia  Continue Depakote qHS once able Supportive care with delirium precautions     Hypothyroidism  Continue Synthroid     Urinary retention  Held Flomax, Foley care   Outpt urology for voiding trial  Pressure injuries/ulcers : Tibial Posterior , right Stage 2, Thigh left,lateral Stage 2, Thigh right, lateral unstageable - all POA    Estimated body mass index is 21.88 kg/m as calculated from the following:   Height as of this encounter: 6\' 2"  (1.88 m).   Weight as of this encounter: 77.3 kg.     Code Status: DNR, discussed extensively with wife on 02/03/2021  Family Communication: Discussed with wife on 02/04/2021  Disposition Plan: Status is: Inpatient  Remains inpatient appropriate because:Inpatient level of care appropriate due to severity of illness   Dispo: The patient is from: Home              Anticipated d/c is to:  Home with [facility]              Patient currently is not medically stable to d/c.   Difficult to place patient  No    Consultants: None  Procedures: None  Antimicrobials: Cefepime Flagyl Vancomycin per rectum  DVT prophylaxis: Eliquis   Objective: Vitals:   02/06/21 0900 02/06/21 1000 02/06/21 1046 02/06/21 1306  BP: 137/88 (!) 133/97 (!) 151/83 132/85  Pulse: 84 82 80 78  Resp: 13 15 20  (!) 24  Temp: (!) 97.5 F (36.4 C)  97.7 F (36.5 C) 97.8 F (36.6 C)  TempSrc: Axillary  Oral Oral  SpO2: 100% 100% 97% 100%  Weight:      Height:        Intake/Output Summary (Last 24 hours) at 02/06/2021 1627 Last data filed at 02/06/2021 1307 Gross per 24 hour  Intake 1019.87 ml  Output 1360 ml  Net -340.13 ml   Filed Weights   02/03/21 0129  Weight: 77.3 kg    Exam: General: NAD, muffled speech, alert, awake, not oriented Cardiovascular: S1, S2 present Respiratory: CTAB Abdomen: Soft, nontender, nondistended, bowel sounds present Musculoskeletal: No bilateral pedal edema noted Skin: Normal Psychiatry:  Unable to assess   Data Reviewed: CBC: Recent Labs  Lab 02/03/21 0155 02/04/21 0539 02/04/21 0831 02/05/21 0610 02/06/21 0311  WBC 24.2* 16.1* 16.2* 12.0* 12.0*  NEUTROABS 20.5* 13.1*  --  9.8* 9.3*  HGB 13.0 9.8* 10.0* 9.7* 10.5*  HCT 39.7 30.4* 31.5* 29.7* 31.5*  MCV 94.5 96.8 97.2 94.6 93.2  PLT 295 212 233 248 654   Basic Metabolic Panel: Recent Labs  Lab 02/03/21 0155 02/04/21 0539 02/05/21 0556 02/05/21 0610 02/06/21 0311  NA 138 139  --  137 141  K 3.9 3.6  --  2.4* 3.8  CL 100 108  --  113* 108  CO2 26 23  --  22 24  GLUCOSE 93 88  --  119* 132*  BUN 14 13  --  9 9  CREATININE 0.88 0.69  --  0.75 0.55*  CALCIUM 8.3* 7.3*  --  6.7* 7.9*  MG  --   --  1.4*  --  2.3   GFR: Estimated Creatinine Clearance: 69.8 mL/min (A) (by C-G formula based on SCr of 0.55 mg/dL (L)). Liver Function Tests: Recent Labs  Lab 02/03/21 0155  AST 35  ALT 25  ALKPHOS 93  BILITOT 1.1  PROT 6.5  ALBUMIN 2.6*   No results for input(s): LIPASE, AMYLASE in the  last 168 hours. No results for input(s): AMMONIA in the last 168 hours. Coagulation Profile: Recent Labs  Lab 02/03/21 0155  INR 1.1   Cardiac Enzymes: No results for input(s): CKTOTAL, CKMB, CKMBINDEX, TROPONINI in the last 168 hours. BNP (last 3 results) No results for input(s): PROBNP in the last 8760 hours. HbA1C: No results for input(s): HGBA1C in the last 72 hours. CBG: No results for input(s): GLUCAP in the last 168 hours. Lipid Profile: No results for input(s): CHOL, HDL, LDLCALC, TRIG, CHOLHDL, LDLDIRECT in the last 72 hours. Thyroid Function Tests: No results for input(s): TSH, T4TOTAL, FREET4, T3FREE, THYROIDAB in the last 72 hours. Anemia Panel: No results for input(s): VITAMINB12, FOLATE, FERRITIN, TIBC, IRON, RETICCTPCT in the last 72 hours. Urine analysis:    Component Value Date/Time   COLORURINE AMBER (A) 02/03/2021 0300   APPEARANCEUR CLOUDY (A) 02/03/2021 0300   LABSPEC 1.024 02/03/2021 0300   PHURINE 5.0 02/03/2021 0300   GLUCOSEU NEGATIVE 02/03/2021  0300   HGBUR LARGE (A) 02/03/2021 0300   BILIRUBINUR NEGATIVE 02/03/2021 0300   BILIRUBINUR negative 12/07/2016 1850   BILIRUBINUR neg 07/29/2014 0906   KETONESUR 5 (A) 02/03/2021 0300   PROTEINUR 100 (A) 02/03/2021 0300   UROBILINOGEN 0.2 12/07/2016 1850   NITRITE NEGATIVE 02/03/2021 0300   LEUKOCYTESUR TRACE (A) 02/03/2021 0300   Sepsis Labs: @LABRCNTIP (procalcitonin:4,lacticidven:4)  ) Recent Results (from the past 240 hour(s))  Blood culture (routine single)     Status: None (Preliminary result)   Collection Time: 02/03/21  1:34 AM   Specimen: BLOOD  Result Value Ref Range Status   Specimen Description   Final    BLOOD LEFT ANTECUBITAL Performed at [facility], [place] [address], [place], [place] [address]    Special Requests   Final    BOTTLES DRAWN AEROBIC AND ANAEROBIC Blood Culture adequate volume Performed at [place] [address],  [place], [place] [address]    Culture   Final    NO GROWTH 3 DAYS Performed at [facility], [place] [address] [place], [place] [address]    Report Status PENDING  Incomplete  Urine culture     Status: None   Collection Time: 02/03/21  3:00 AM   Specimen: In/Out Cath Urine  Result Value Ref Range Status   Specimen Description   Final    IN/OUT CATH URINE Performed at [facility] [address] [place], [place] [address]    Special Requests   Final    NONE Performed at [facility], [place] [address], [place], [place] [address]    Culture   Final    NO GROWTH Performed at [facility], [place] [address], [place], [place] [address]    Report Status 02/05/2021 FINAL  Final  MRSA PCR Screening     Status: None   Collection Time: 02/03/21  8:51 AM   Specimen: Nasal Mucosa; Nasopharyngeal  Result Value Ref Range Status   MRSA by PCR NEGATIVE NEGATIVE Final    Comment:        The GeneXpert MRSA Assay (FDA approved for NASAL specimens only), is one component of a comprehensive MRSA colonization surveillance program. It is not intended to diagnose MRSA infection nor to guide or monitor treatment for MRSA infections. Performed at [facility], [place] [address] [place], [place] [address]   Culture, blood (single)     Status: None (Preliminary result)   Collection Time: 02/03/21  9:41 AM   Specimen: BLOOD  Result Value Ref Range Status   Specimen Description   Final    BLOOD RIGHT ANTECUBITAL Performed at [facility] [address], [place], [place] [address]    Special Requests   Final    BOTTLES DRAWN AEROBIC ONLY Blood Culture adequate volume Performed at [place] [address], [place], [place] [address]    Culture   Final    NO GROWTH 3 DAYS Performed at [facility], [place] [address], [place], [place] [address]    Report Status PENDING  Incomplete  Culture,  blood (x 2)     Status: None (Preliminary result)   Collection Time: 02/03/21  9:41 AM   Specimen: BLOOD  Result Value Ref Range Status   Specimen Description   Final    BLOOD LEFT ANTECUBITAL Performed at [facility] [address] [place], [place] [address]    Special Requests  Final    BOTTLES DRAWN AEROBIC ONLY LEFT ANTECUBITAL Performed at [facility] [address], [place], [place] [address]    Culture   Final    NO GROWTH 3 DAYS Performed at [facility], [place] [address], [place], [place] [address]    Report Status PENDING  Incomplete  C Difficile Quick Screen w PCR reflex     Status: Abnormal   Collection Time: 02/03/21  3:20 PM   Specimen: STOOL  Result Value Ref Range Status   C Diff antigen POSITIVE (A) NEGATIVE Final   C Diff toxin POSITIVE (A) NEGATIVE Final   C Diff interpretation Toxin producing C. difficile detected.  Final    Comment: RBV TO JANEE Performed at [facility], [place] [address] [place], [place] [address]   Gastrointestinal Panel by PCR , Stool     Status: None   Collection Time: 02/03/21  3:20 PM   Specimen: STOOL  Result Value Ref Range Status   Campylobacter species NOT DETECTED NOT DETECTED Final   Plesimonas shigelloides NOT DETECTED NOT DETECTED Final   Salmonella species NOT DETECTED NOT DETECTED Final   Yersinia enterocolitica NOT DETECTED NOT DETECTED Final   Vibrio species NOT DETECTED NOT DETECTED Final   Vibrio cholerae NOT DETECTED NOT DETECTED Final   Enteroaggregative E coli (EAEC) NOT DETECTED NOT DETECTED Final   Enteropathogenic E coli (EPEC) NOT DETECTED NOT DETECTED Final   Enterotoxigenic E coli (ETEC) NOT DETECTED NOT DETECTED Final   Shiga like toxin producing E coli (STEC) NOT DETECTED NOT DETECTED Final   Shigella/Enteroinvasive E coli (EIEC) NOT DETECTED NOT DETECTED Final   Cryptosporidium NOT DETECTED NOT DETECTED Final   Cyclospora cayetanensis NOT  DETECTED NOT DETECTED Final   Entamoeba histolytica NOT DETECTED NOT DETECTED Final   Giardia lamblia NOT DETECTED NOT DETECTED Final   Adenovirus F40/41 NOT DETECTED NOT DETECTED Final   Astrovirus NOT DETECTED NOT DETECTED Final   Norovirus GI/GII NOT DETECTED NOT DETECTED Final   Rotavirus A NOT DETECTED NOT DETECTED Final   Sapovirus (I, II, IV, and V) NOT DETECTED NOT DETECTED Final    Comment: Performed at [facility], [address], [place], [place] [address]      Studies: No results found.  Scheduled Meds:  (feeding supplement) PROSource Plus  30 mL Oral BID BM   apixaban  10 mg Oral BID   Followed by   Derrill Memo ON 02/12/2021] apixaban  5 mg Oral BID   vitamin C  500 mg Oral Daily   chlorhexidine  15 mL Mouth Rinse BID   Chlorhexidine Gluconate Cloth  6 each Topical Daily   divalproex  500 mg Oral QHS   feeding supplement  237 mL Oral BID BM   Ipratropium-Albuterol  1 puff Inhalation Q6H   levothyroxine  112 mcg Oral Q0600   mouth rinse  15 mL Mouth Rinse q12n4p   multivitamin with minerals  1 tablet Oral Daily   traZODone  50 mg Oral QHS   vancomycin (VANCOCIN) rectal ENEMA  500 mg Rectal Q6H   zinc sulfate  220 mg Oral Daily    Continuous Infusions:  ceFEPime (MAXIPIME) IV Stopped (02/06/21 1436)   metronidazole Stopped (02/06/21 1436)     LOS: 3 days     Alma Friendly, MD [facility]  If 7PM-7AM, please contact night-coverage [email or web address] 02/06/2021, 4:27 PM

## 2021-02-06 NOTE — Progress Notes (Signed)
  Speech Language Pathology Treatment: Dysphagia  Patient Details Name: Larry Cordova MRN: [id number] DOB: Mar 20, 1930 Today's Date: 02/06/2021 Time: 2094-7096 SLP Time Calculation (min) (ACUTE ONLY): 25 min  Assessment / Plan / Recommendation Clinical Impression  Patient seen to address dysphagia goals. Prior to entering room, NT spoke with SLP and stated that although he seems to do well with the puree solids, he is not able to use straw and when trying to give him sips from cup, he struggles. She thought it might be easiest to give him liquids by spoon. SLP trialed thin liquids by cup, spoon, straw, honey thick liquids by spoon, nectar thick liquids by spoon, and gelatin. Patient exhibited prolonged mastication with gelatin but full clearance from oral cavity after a couple minutes. He was not able to form lips around cup and although he eventually was able to suck with straw for thin liquids (water), he then exhibited coughing episode. He tolerated nectar thick liquids and honey thick liquids via spoon sips without observed difficulty. Patient did not exhibit any refusals and promptly opened mouth to accept spoon sips of liquids. Patient is constantly talking and does not necessarily stop when he has PO's in his mouth, which increases his aspiration risk. In addition, patient with suspected delayed swallow initiation. SLP recommending to downgrade liquids to nectar thick and recommending to give patient spoon sips.    HPI HPI: Larry Cordova is a 87 y.o. male with medical history significant for advanced dementia with behavioral disturbance, hypertension, hypothyroidism, urinary retention with Foley catheter, COVID-19 diagnosed on 01/18/2021, and recent hospitalization with acute renal failure and staph epidermidis bacteremia.      SLP Plan  Continue with current plan of care       Recommendations  Diet recommendations: Dysphagia 1 (puree);Nectar-thick liquid Liquids provided via:  Teaspoon Medication Administration: Crushed with puree Supervision: Trained caregiver to feed patient Compensations: Minimize environmental distractions;Slow rate;Small sips/bites Postural Changes and/or Swallow Maneuvers: Seated upright 90 degrees                Oral Care Recommendations: Oral care BID;Staff/trained caregiver to provide oral care Follow up Recommendations: Skilled Nursing facility;24 hour supervision/assistance SLP Visit Diagnosis: Dysphagia, unspecified (R13.10) Plan: Continue with current plan of care       GO               Larry Baller, MA, CCC-SLP Speech Therapy

## 2021-02-06 NOTE — Plan of Care (Signed)
  Problem: Safety: Goal: Non-violent Restraint(s) Outcome: Progressing   Problem: Education: Goal: Knowledge of General Education information will improve Description: Including pain rating scale, medication(s)/side effects and non-pharmacologic comfort measures Outcome: Progressing   Problem: Health Behavior/Discharge Planning: Goal: Ability to manage health-related needs will improve Outcome: Progressing   Problem: Clinical Measurements: Goal: Ability to maintain clinical measurements within normal limits will improve Outcome: Progressing Goal: Will remain free from infection Outcome: Progressing Goal: Diagnostic test results will improve Outcome: Progressing Goal: Respiratory complications will improve Outcome: Progressing Goal: Cardiovascular complication will be avoided Outcome: Progressing   Problem: Activity: Goal: Risk for activity intolerance will decrease Outcome: Progressing   Problem: Nutrition: Goal: Adequate nutrition will be maintained Outcome: Progressing   Problem: Coping: Goal: Level of anxiety will decrease Outcome: Progressing   Problem: Elimination: Goal: Will not experience complications related to bowel motility Outcome: Progressing Goal: Will not experience complications related to urinary retention Outcome: Progressing   Problem: Pain Managment: Goal: General experience of comfort will improve Outcome: Progressing   Problem: Safety: Goal: Ability to remain free from injury will improve Outcome: Progressing   Problem: Skin Integrity: Goal: Risk for impaired skin integrity will decrease Outcome: Progressing   Problem: Education: Goal: Knowledge of risk factors and measures for prevention of condition will improve Outcome: Progressing   Problem: Coping: Goal: Psychosocial and spiritual needs will be supported Outcome: Progressing   Problem: Respiratory: Goal: Will maintain a patent airway Outcome: Progressing Goal: Complications  related to the disease process, condition or treatment will be avoided or minimized Outcome: Progressing

## 2021-02-06 NOTE — Progress Notes (Signed)
OT Cancellation Note  Patient Details Name: JACKSON COFFIELD MRN: [id number] DOB: 12-Aug-1930   Cancelled Treatment:    Reason Eval/Treat Not Completed: Patient's level of consciousness;OT screened, no needs identified, will sign off: Pt's chart reviewed, pt observed through door and spoke with pt's RN who confirms that pt is currently total care, following no commands, in current need of wrist restraints due to agitation, and is not an appropriate candidate for skilled therapy services with OT.  OT will sign off at this time with recommendation of total comfort care.  Please reconsult if pt has change in status and becomes able to work with OT.    Julien Girt 02/06/2021, 11:52 AM

## 2021-02-06 NOTE — Progress Notes (Signed)
Pharmacy Antibiotic Note  Larry Cordova is a 87 y.o. male admitted on 02/03/2021 with history of severe dementia. Family sent pt to ED initially concerned a bout a problem with his foley catheter.  Pt noted to be exhibiting rigors at arrival and had temp of 102.7 .  Pharmacy has been consulted to dose cefepime for sepsis, UTI, PNA. SCr low/stable, WBC down, PCT down, Afebrile Today is day #4 antibiotics.  Plan: Cefepime 2g IV q8h Metronidazole and Vancomycin enema per MD Follow up renal function, culture results, and clinical course.    Height: 6\' 2"  (188 cm) Weight: 77.3 kg (170 lb 6.7 oz) IBW/kg (Calculated) : 82.2  Temp (24hrs), Avg:97.6 F (36.4 C), Min:97.2 F (36.2 C), Max:97.8 F (36.6 C)  Recent Labs  Lab 02/03/21 0155 02/03/21 0158 02/03/21 0415 02/03/21 0240 02/03/21 0941 02/03/21 1138 02/04/21 0539 02/04/21 0831 02/05/21 0610 02/06/21 0311  WBC 24.2*  --   --   --   --   --  16.1* 16.2* 12.0* 12.0*  CREATININE 0.88  --   --   --   --   --  0.69  --  0.75 0.55*  LATICACIDVEN  --  2.1* 2.1* 2.1* 1.5 1.6  --   --   --   --     Estimated Creatinine Clearance: 69.8 mL/min (A) (by C-G formula based on SCr of 0.55 mg/dL (L)).    No Known Allergies  Antimicrobials this admission: 2/25 Ceftriaxone >> 2/25 2/25 Azithromycin >> 2/25 2/25 metronidazole >>  2/25 Vancomycin >> 2/25 2/25 Cefepime >>  2/25 Vancomycin enema >>   Dose adjustments this admission:   Microbiology results: 2/25 BCx: ngtd 2/25 UCx: NGF 2/25 GI panel: none 2/25 Cdiff Ag+, toxin + 2/25 MRSA PCR: negative   Thank you for allowing pharmacy to be a part of this patient's care. Gretta Arab PharmD, BCPS Clinical Pharmacist [facility] main pharmacy [phone] 02/06/2021 8:45 AM

## 2021-02-07 ENCOUNTER — Encounter (HOSPITAL_COMMUNITY): Payer: Medicare Other

## 2021-02-07 ENCOUNTER — Inpatient Hospital Stay (HOSPITAL_COMMUNITY): Payer: Medicare Other

## 2021-02-07 DIAGNOSIS — U071 COVID-19: Secondary | ICD-10-CM

## 2021-02-07 DIAGNOSIS — R609 Edema, unspecified: Secondary | ICD-10-CM | POA: Diagnosis not present

## 2021-02-07 DIAGNOSIS — F0391 Unspecified dementia with behavioral disturbance: Secondary | ICD-10-CM | POA: Diagnosis not present

## 2021-02-07 DIAGNOSIS — Z515 Encounter for palliative care: Secondary | ICD-10-CM

## 2021-02-07 DIAGNOSIS — G9340 Encephalopathy, unspecified: Secondary | ICD-10-CM

## 2021-02-07 DIAGNOSIS — R338 Other retention of urine: Secondary | ICD-10-CM | POA: Diagnosis not present

## 2021-02-07 DIAGNOSIS — R6520 Severe sepsis without septic shock: Secondary | ICD-10-CM

## 2021-02-07 DIAGNOSIS — A419 Sepsis, unspecified organism: Secondary | ICD-10-CM | POA: Diagnosis not present

## 2021-02-07 DIAGNOSIS — Z7189 Other specified counseling: Secondary | ICD-10-CM

## 2021-02-07 DIAGNOSIS — Z66 Do not resuscitate: Secondary | ICD-10-CM

## 2021-02-07 LAB — CBC WITH DIFFERENTIAL/PLATELET
Abs Immature Granulocytes: 0.12 10*3/uL — ABNORMAL HIGH (ref 0.00–0.07)
Basophils Absolute: 0.1 10*3/uL (ref 0.0–0.1)
Basophils Relative: 1 %
Eosinophils Absolute: 0.4 10*3/uL (ref 0.0–0.5)
Eosinophils Relative: 4 %
HCT: 31.4 % — ABNORMAL LOW (ref 39.0–52.0)
Hemoglobin: 10.2 g/dL — ABNORMAL LOW (ref 13.0–17.0)
Immature Granulocytes: 1 %
Lymphocytes Relative: 17 %
Lymphs Abs: 1.8 10*3/uL (ref 0.7–4.0)
MCH: 30.4 pg (ref 26.0–34.0)
MCHC: 32.5 g/dL (ref 30.0–36.0)
MCV: 93.7 fL (ref 80.0–100.0)
Monocytes Absolute: 1.3 10*3/uL — ABNORMAL HIGH (ref 0.1–1.0)
Monocytes Relative: 13 %
Neutro Abs: 6.9 10*3/uL (ref 1.7–7.7)
Neutrophils Relative %: 64 %
Platelets: 265 10*3/uL (ref 150–400)
RBC: 3.35 MIL/uL — ABNORMAL LOW (ref 4.22–5.81)
RDW: 13.6 % (ref 11.5–15.5)
WBC: 10.6 10*3/uL — ABNORMAL HIGH (ref 4.0–10.5)
nRBC: 0 % (ref 0.0–0.2)

## 2021-02-07 LAB — BASIC METABOLIC PANEL
Anion gap: 5 (ref 5–15)
BUN: 8 mg/dL (ref 8–23)
CO2: 27 mmol/L (ref 22–32)
Calcium: 7.7 mg/dL — ABNORMAL LOW (ref 8.9–10.3)
Chloride: 109 mmol/L (ref 98–111)
Creatinine, Ser: 0.64 mg/dL (ref 0.61–1.24)
GFR, Estimated: >60 mL/min (ref >60)
Glucose, Bld: 106 mg/dL — ABNORMAL HIGH (ref 70–99)
Potassium: 3.5 mmol/L (ref 3.5–5.1)
Sodium: 141 mmol/L (ref 135–145)

## 2021-02-07 LAB — C-REACTIVE PROTEIN: CRP: 4.6 mg/dL — ABNORMAL HIGH (ref <1.0)

## 2021-02-07 MED ORDER — VANCOMYCIN 50 MG/ML ORAL SOLUTION
125.0000 mg | Freq: Four times a day (QID) | ORAL | Status: DC
  Administered 2021-02-07 – 2021-02-10 (×13): 125 mg via ORAL
  Filled 2021-02-07 (×18): qty 2.5

## 2021-02-07 NOTE — Care Management Important Message (Signed)
Important Message  Patient Details IM Letter placed in Patient's door caddy. Name: Larry Cordova MRN: [id number] Date of Birth: Jul 13, 1930   Medicare Important Message Given:  Yes     Kerin Salen 02/07/2021, 10:44 AM

## 2021-02-07 NOTE — Progress Notes (Addendum)
PROGRESS NOTE    Larry Cordova  HYI:[id number] DOB: 08/02/1930 DOA: 02/03/2021 PCP: Larry Agreste, MD    Brief Narrative: HPI from Dr Larry Cordova Handis a 87 y.o.malewith medical history significant foradvanced dementia with behavioral disturbance, HTN, hypothyroidism, urinary retention with Foley catheter, COVID-19 diagnosed on 01/18/2021, and recent hospitalization with acute renal failure and staph epidermidis bacteremia, now presenting to the ED for evaluation of Foley catheter problem. The patient was brought in by his wife after he appeared to have pulled his Foley catheter part of the way out. Family noted some blood in the catheter. Patient is unable to contribute to the history due to his dementia. Discussed with wife who noted that patient has been more confused than his baseline, with poor appetite and has been having diarrhea since his discharge from hospital.  In the ED, patient is found to be in rigors with temperature 102.7, tachypnea, tachycardia, and hypotension. Chest x-ray demonstrates increased regions of patchy and ill-defined opacity. CBC notable for leukocytosis of 24,200. Lactic acid 2.1. Blood and urine culture were collected. Patient was given 30 cc/kgLR, acetaminophen, vancomycin, cefepime, and Flagyl.  Patient admitted for further management.  Assessment & Plan:   Principal Problem:   Sepsis ([place]) Active Problems:   Hypothyroidism   HTN (hypertension)   Dementia with behavioral disturbance ([place])   Acute urinary retention   COVID-19 virus infection   Pressure injury of skin   #1severe sepsis-present on admission secondary to C. difficile colitis and HCAP-chest x-ray/ct chest  showed multifocal bacterial pneumonia/concern for recent Covid infection, UA consistent with UTI though urine culture did not have any growth as of yet.  Blood culture negative. He continues with watery diarrhea with rectal tube in place.  GI panel negative C. difficile toxin  positive. Patient met sepsis criteria on admission with hypotension with a blood pressure of 75/48 tachycardic with a heart rate above 100, febrile with a temp of 103, leukocytosis 24,000 white count, lactic acid 2.1.  He was treated with sepsis protocol, IV fluids vancomycin cefepime . WBC trending down.  #2 [id number] diagnosed 01/18/2021 continue isolation through 02/08/2021.crp 4.6 from 11.  #3 history of dementia with behaviors/delirium -worse with sepsis.  Continue Depakote  #4 hypothyroidism continue Synthroid  #5 urinary retention  Flomax on hold and has chronic Foley catheter.  #6 pulmonary embolism CT of the chest showed small age indeterminant PE in the right middle lobe and left upper lobe.  Doppler of the lower extremity showed no evidence of DVT.  He was started on Eliquis.  #7 superficial thrombophlebitis of the left upper extremity no DVT by ultrasound.  #8 multiple pressure ulcers as below present on admission  #9 goals of care discussed with patient's wife and daughter Larry Cordova.  They are arranging for him to come home.  They were open to have palliative consulted and may be have hospice see him while he is here.  His prognosis is very poor with all the comorbidities and poor functional status.  He is DNR on admission.  Pressure Injury 02/03/21 Tibial Posterior;Right Stage 2 -  Partial thickness loss of dermis presenting as a shallow open injury with a red, pink wound bed without slough. (Active)  02/03/21 1023  Location: Tibial  Location Orientation: Posterior;Right  Staging: Stage 2 -  Partial thickness loss of dermis presenting as a shallow open injury with a red, pink wound bed without slough.  Wound Description (Comments):   Present on Admission: Yes  Pressure Injury 02/03/21 Thigh Left;Lateral Stage 2 -  Partial thickness loss of dermis presenting as a shallow open injury with a red, pink wound bed without slough. (Active)  02/03/21 1026  Location: Thigh  Location  Orientation: Left;Lateral  Staging: Stage 2 -  Partial thickness loss of dermis presenting as a shallow open injury with a red, pink wound bed without slough.  Wound Description (Comments):   Present on Admission: Yes     Pressure Injury 02/03/21 Thigh Right;Lateral Unstageable - Full thickness tissue loss in which the base of the injury is covered by slough (yellow, tan, gray, green or brown) and/or eschar (tan, brown or black) in the wound bed. (Active)  02/03/21 1028  Location: Thigh  Location Orientation: Right;Lateral  Staging: Unstageable - Full thickness tissue loss in which the base of the injury is covered by slough (yellow, tan, gray, green or brown) and/or eschar (tan, brown or black) in the wound bed.  Wound Description (Comments):   Present on Admission: Yes      Nutrition Problem: Increased nutrient needs Etiology: acute illness,catabolic illness ([id number] infection)     Signs/Symptoms: estimated needs    Interventions: Ensure Enlive (each supplement provides 350kcal and 20 grams of protein),Prostat,MVI  Estimated body mass index is 21.88 kg/m as calculated from the following:   Height as of this encounter: _0  (1.88 m).   Weight as of this encounter: 77.3 kg.  DVT prophylaxis: Lovenox  code Status: DO NOT RESUSCITATE Family Communication: Will discuss with wife Disposition Plan:  Status is: Inpatient  Dispo: The patient is from: Home              Anticipated d/c is to: Family wants to take him home when he is stable to be discharged.              Patient currently is not medically stable to d/c.   Difficult to place patient no Consultants: None  Procedures: None Antimicrobials: Vancomycin p.o. and cefepime.  Subjective: Patient restless with hands mittens in place left upper extremity with IV in place much swollen tender right upper extremity.  Objective: Vitals:   02/06/21 1306 02/06/21 1808 02/06/21 2213 02/07/21 0705  BP: 132/85 119/73 132/71  (!) 126/48  [place]: 78 71 72 84  Resp: (!) _1 Temp: 97.8 F (36.6 C) 97.8 F (36.6 C) 97.8 F (36.6 C) 97.8 F (36.6 C)  TempSrc: Oral Axillary Oral Oral  SpO2: 100% 98% 96% 96%  Weight:      Height:        Intake/Output Summary (Last 24 hours) at 02/07/2021 1101 Last data filed at 02/07/2021 4193 Gross per 24 hour  Intake 689.51 ml  Output 1225 ml  Net -535.49 ml   Filed Weights   02/03/21 0129  Weight: 77.3 kg    Examination:  General exam: Appears restless Respiratory system: Clear to auscultation. Respiratory effort normal. Cardiovascular system: S1 & S2 heard, RRR. No JVD, murmurs, rubs, gallops or clicks. No pedal edema. Gastrointestinal system: Abdomen is nondistended, soft and nontender. No organomegaly or masses felt. Normal bowel sounds heard. Central nervous system: Confused moves all extremities  extremities: Left upper extremity 3+ edema Skin: No rashes, lesions or ulcers Psychiatry confused restless   Data Reviewed: I have personally reviewed following labs and imaging studies  CBC: Recent Labs  Lab 02/03/21 0155 02/04/21 0539 02/04/21 0831 02/05/21 0610 02/06/21 0311 02/07/21 0420  WBC 24.2* 16.1* 16.2* 12.0* 12.0* 10.6*  NEUTROABS 20.5*  13.1*  --  9.8* 9.3* 6.9  HGB 13.0 9.8* 10.0* 9.7* 10.5* 10.2*  HCT 39.7 30.4* 31.5* 29.7* 31.5* 31.4*  MCV 94.5 96.8 97.2 94.6 93.2 93.7  PLT 295 212 233 248 288 989   Basic Metabolic Panel: Recent Labs  Lab 02/03/21 0155 02/04/21 0539 02/05/21 0556 02/05/21 0610 02/06/21 0311 02/07/21 0420  NA 138 139  --  137 141 141  K 3.9 3.6  --  2.4* 3.8 3.5  CL 100 108  --  113* 108 109  CO2 26 23  --  _0 GLUCOSE 93 88  --  119* 132* 106*  BUN 14 13  --  _1 CREATININE 0.88 0.69  --  0.75 0.55* 0.64  CALCIUM 8.3* 7.3*  --  6.7* 7.9* 7.7*  MG  --   --  1.4*  --  2.3  --    GFR: Estimated Creatinine Clearance: 69.8 mL/min (by C-G formula based on SCr of 0.64 mg/dL). Liver Function  Tests: Recent Labs  Lab 02/03/21 0155  AST 35  ALT 25  ALKPHOS 93  BILITOT 1.1  PROT 6.5  ALBUMIN 2.6*   No results for input(s): LIPASE, AMYLASE in the last 168 hours. No results for input(s): AMMONIA in the last 168 hours. Coagulation Profile: Recent Labs  Lab 02/03/21 0155  INR 1.1   Cardiac Enzymes: No results for input(s): CKTOTAL, CKMB, CKMBINDEX, TROPONINI in the last 168 hours. BNP (last 3 results) No results for input(s): PROBNP in the last 8760 hours. HbA1C: No results for input(s): HGBA1C in the last 72 hours. CBG: No results for input(s): GLUCAP in the last 168 hours. Lipid Profile: No results for input(s): CHOL, HDL, LDLCALC, TRIG, CHOLHDL, LDLDIRECT in the last 72 hours. Thyroid Function Tests: No results for input(s): TSH, T4TOTAL, FREET4, T3FREE, THYROIDAB in the last 72 hours. Anemia Panel: No results for input(s): VITAMINB12, FOLATE, FERRITIN, TIBC, IRON, RETICCTPCT in the last 72 hours. Sepsis Labs: Recent Labs  Lab 02/03/21 0415 02/03/21 0527 02/03/21 2119 02/03/21 0941 02/03/21 1138 02/04/21 0539 02/05/21 0610  PROCALCITON  --  0.19  --   --   --  0.35 0.16  LATICACIDVEN 2.1*  --  2.1* 1.5 1.6  --   --     Recent Results (from the past 240 hour(s))  Blood culture (routine single)     Status: None (Preliminary result)   Collection Time: 02/03/21  1:34 AM   Specimen: BLOOD  Result Value Ref Range Status   Specimen Description   Final    BLOOD LEFT ANTECUBITAL Performed at [facility], [place] [address] [place], [place] [address]    Special Requests   Final    BOTTLES DRAWN AEROBIC AND ANAEROBIC Blood Culture adequate volume Performed at [place] [address], [place], [place] [address]    Culture   Final    NO GROWTH 4 DAYS Performed at [facility], [place] [address], [place], [place] [address]    Report Status PENDING  Incomplete  Urine culture     Status: None   Collection Time:  02/03/21  3:00 AM   Specimen: In/Out Cath Urine  Result Value Ref Range Status   Specimen Description   Final    IN/OUT CATH URINE Performed at [facility] [place] [address], [place], [place] [address]    Special Requests   Final    NONE Performed at [facility],  [place] [address], [place], [place] [address]    Culture   Final    NO GROWTH Performed at [facility], [place] [address] [place], [place] [address]    Report Status 02/05/2021 FINAL  Final  MRSA PCR Screening     Status: None   Collection Time: 02/03/21  8:51 AM   Specimen: Nasal Mucosa; Nasopharyngeal  Result Value Ref Range Status   MRSA by PCR NEGATIVE NEGATIVE Final    Comment:        The GeneXpert MRSA Assay (FDA approved for NASAL specimens only), is one component of a comprehensive MRSA colonization surveillance program. It is not intended to diagnose MRSA infection nor to guide or monitor treatment for MRSA infections. Performed at [facility], [place] [address], [place], [place] [address]   Culture, blood (single)     Status: None (Preliminary result)   Collection Time: 02/03/21  9:41 AM   Specimen: BLOOD  Result Value Ref Range Status   Specimen Description   Final    BLOOD RIGHT ANTECUBITAL Performed at [place] [address] [place], [place] [address]    Special Requests   Final    BOTTLES DRAWN AEROBIC ONLY Blood Culture adequate volume Performed at [place] [address] [place], [place] [address]    Culture   Final    NO GROWTH 4 DAYS Performed at [facility], [place] [address], [place], [place] [address]    Report Status PENDING  Incomplete  Culture, blood (x 2)     Status: None (Preliminary result)   Collection Time: 02/03/21  9:41 AM   Specimen: BLOOD  Result Value Ref Range Status   Specimen Description   Final    BLOOD LEFT ANTECUBITAL Performed at [place] [address], [place], [place] [address]    Special Requests   Final    BOTTLES DRAWN AEROBIC ONLY LEFT ANTECUBITAL Performed at [place] [address] [place], [place] [address]    Culture   Final    NO GROWTH 4 DAYS Performed at [facility], [place] [address] [place], [place] [address]    Report Status PENDING  Incomplete  C Difficile Quick Screen w PCR reflex     Status: Abnormal   Collection Time: 02/03/21  3:20 PM   Specimen: STOOL  Result Value Ref Range Status   C Diff antigen POSITIVE (A) NEGATIVE Final   C Diff toxin POSITIVE (A) NEGATIVE Final   C Diff interpretation Toxin producing C. difficile detected.  Final    Comment: RBV TO JANEE Performed at [facility], [place] [address] [place], [place] [address]   Gastrointestinal Panel by PCR , Stool     Status: None   Collection Time: 02/03/21  3:20 PM   Specimen: STOOL  Result Value Ref Range Status   Campylobacter species NOT DETECTED NOT DETECTED Final   Plesimonas shigelloides NOT DETECTED NOT DETECTED Final   Salmonella species NOT DETECTED NOT DETECTED Final   Yersinia enterocolitica NOT DETECTED NOT DETECTED Final   Vibrio species NOT DETECTED NOT DETECTED Final   Vibrio cholerae NOT DETECTED NOT DETECTED Final   Enteroaggregative E coli (EAEC) NOT DETECTED NOT DETECTED Final   Enteropathogenic E coli (EPEC) NOT DETECTED NOT DETECTED Final   Enterotoxigenic E coli (ETEC) NOT DETECTED NOT DETECTED Final   Shiga like toxin producing E coli (STEC) NOT DETECTED NOT  DETECTED Final   Shigella/Enteroinvasive E coli (EIEC) NOT DETECTED NOT DETECTED Final   Cryptosporidium NOT DETECTED NOT DETECTED Final   Cyclospora cayetanensis NOT DETECTED NOT DETECTED Final   Entamoeba histolytica NOT DETECTED NOT DETECTED Final   Giardia lamblia NOT DETECTED NOT DETECTED Final   Adenovirus F40/41 NOT DETECTED NOT DETECTED Final   Astrovirus NOT DETECTED NOT DETECTED Final    Norovirus GI/GII NOT DETECTED NOT DETECTED Final   Rotavirus A NOT DETECTED NOT DETECTED Final   Sapovirus (I, II, IV, and V) NOT DETECTED NOT DETECTED Final    Comment: Performed at [facility], [address] [place], [place] [address]         Radiology Studies: VAS [place] UPPER EXTREMITY VENOUS DUPLEX  Result Date: 02/07/2021 UPPER VENOUS STUDY  Indications: Edema Risk Factors: COVID 19 positive. Limitations: Bandages, line and patient positioning, patient movement. Comparison Study: No prior studies. Performing Technologist: Oliver Hum RVT  Examination Guidelines: A complete evaluation includes B-mode imaging, spectral Doppler, color Doppler, and power Doppler as needed of all accessible portions of each vessel. Bilateral testing is considered an integral part of a complete examination. Limited examinations for reoccurring indications may be performed as noted.  Right Findings: +----------+------------+---------+-----------+----------+-------+ RIGHT     CompressiblePhasicitySpontaneousPropertiesSummary +----------+------------+---------+-----------+----------+-------+ Subclavian    Full       Yes       Yes                      +----------+------------+---------+-----------+----------+-------+  Left Findings: +----------+------------+---------+-----------+----------+-------+ LEFT      CompressiblePhasicitySpontaneousPropertiesSummary +----------+------------+---------+-----------+----------+-------+ IJV           Full       Yes       Yes                      +----------+------------+---------+-----------+----------+-------+ Subclavian    Full       Yes       Yes                      +----------+------------+---------+-----------+----------+-------+ Axillary      Full       Yes       Yes                      +----------+------------+---------+-----------+----------+-------+ Brachial      Full       Yes       Yes                       +----------+------------+---------+-----------+----------+-------+ Radial        Full                                          +----------+------------+---------+-----------+----------+-------+ Ulnar         Full                                          +----------+------------+---------+-----------+----------+-------+ Cephalic      None                                   Acute  +----------+------------+---------+-----------+----------+-------+ Basilic  Full                                          +----------+------------+---------+-----------+----------+-------+  Summary:  Right: No evidence of thrombosis in the subclavian.  Left: No evidence of deep vein thrombosis in the upper extremity. Findings consistent with acute superficial vein thrombosis involving the left cephalic vein.  *See table(s) above for measurements and observations.    Preliminary         Scheduled Meds: . (feeding supplement) PROSource Plus  30 mL Oral BID BM  . apixaban  10 mg Oral BID   Followed by  . [START ON 02/12/2021] apixaban  5 mg Oral BID  . vitamin C  500 mg Oral Daily  . chlorhexidine  15 mL Mouth Rinse BID  . Chlorhexidine Gluconate Cloth  6 each Topical Daily  . divalproex  500 mg Oral QHS  . feeding supplement  237 mL Oral BID BM  . Ipratropium-Albuterol  1 puff Inhalation Q6H  . levothyroxine  112 mcg Oral Q0600  . mouth rinse  15 mL Mouth Rinse q12n4p  . multivitamin with minerals  1 tablet Oral Daily  . traZODone  50 mg Oral QHS  . vancomycin  125 mg Oral QID  . zinc sulfate  220 mg Oral Daily   Continuous Infusions: . ceFEPime (MAXIPIME) IV 2 g (02/07/21 0641)     LOS: 4 days   Georgette Shell, MD 02/07/2021, 11:01 AM

## 2021-02-07 NOTE — Consult Note (Signed)
Consultation Note Date: 02/07/2021   Patient Name: Larry Cordova  DOB: 05/10/1930  MRN: [id number]  Age / Sex: 87 y.o., male   PCP: [name], MD Referring Physician: Georgette Shell, MD   REASON FOR CONSULTATION:Establishing goals of care  Palliative Care consult requested for goals of care discussion in this 87 y.o. male with multiple medical problems including advanced dementia with behavioral disturbance, hypertension, hypothyroidism, urinary retention (chronic Foley catheter), kyphosis, COVID-19 (01/18/2021), and recent hospitalization with acute renal failure and staph epidermidis bacteremia. Patient presenting to ED from home with complaints of foley catheter dislodgement and family noted some blood in catheter. Since admission patient receiving treatment for severe sepsis secondary to colitis and HCAP.   Clinical Assessment and Goals of Care: I have reviewed medical records including lab results, imaging, Epic notes, and MAR.  I spoke with patient's wife Corporate treasurer) and patient's daughter Larry Cordova) to discuss diagnosis prognosis, GOC, EOL wishes, disposition and options. Wife is requesting daughter be point-of-contact.   I introduced Palliative Medicine as specialized medical care for people living with serious illness. It focuses on providing relief from the symptoms and stress of a serious illness. The goal is to improve quality of life for both the patient and the family. Family verbalized understanding.   We discussed a brief life review of the patient, along with his functional and nutritional status. Patient lives in the home with his son and wife of more than 21 years. 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 He has 2 children (daughter is a Therapist, sports).   Daughter shares patient has suffered from advanced dementia for some time. Health seems to be rapidly declining since his fall in January 2022. She reports prior to his fall he was ambulatory  with a walker and somewhat functional. He has now become bedridden developing multiple decubitus. His appetite has been poor with some signs of occasional aspiration.    We discussed His current illness and what it means in the larger context of His on-going co-morbidities. Natural disease trajectory and expectations at EOL were discussed.  A detailed discussion was had today regarding advanced directives.  Concepts specific to code status, artifical feeding and hydration, continued IV antibiotics and rehospitalization. The difference between a aggressive medical intervention and a palliative comfort care path were discussed at length. Values and goals of care important to patient and family were attempted to be elicited.   Family is clear in expressing goals to focus on patient's comfort and avoid any obvious suffering. They are not interested in any forms of aggressive care or interventions. They are wish to treat the treatable and once medically optimized (knowing he will not have a meaningful recovery) would like him to return home with family and spend what time he has left. They are not interested in rehospitalizations.   Detailed education provided on MOST form.  Family requested to complete electronic form. Family outlined their wishes as patient would want for himself indicating the following treatment decisions:  Cardiopulmonary Resuscitation: Do Not Attempt Resuscitation (DNR/No CPR)  Medical Interventions: Comfort Measures: Keep clean, warm, and dry. Use medication by any route, positioning, wound care, and other measures to relieve pain and suffering. Use oxygen, suction and manual treatment of airway obstruction as needed for comfort. Do not transfer to the hospital unless comfort needs cannot be met in current location.  Antibiotics: Determine use of limitation of antibiotics when infection occurs  IV  Fluids: No IV fluids (provide other measures to ensure comfort)  Feeding Tube: No  feeding tube     Hospice and Palliative Care services outpatient were explained and offered. Patient and family verbalized their understanding and awareness of both palliative and hospice's goals and philosophy of care. They are requesting outpatient hospice support in the home to assist family in focusing on patient's comfort.   Questions and concerns were addressed.The family was encouraged to call with questions or concerns.  PMT will continue to support holistically as needed.   CODE STATUS: DNR  ADVANCE DIRECTIVES: Primary Decision Maker: Larry Cordova (daughter)    SYMPTOM MANAGEMENT: see below   Palliative Prophylaxis:   Aspiration, Bowel Regimen, Delirium Protocol, Eye Care, Frequent Pain Assessment, Oral Care and Palliative Wound Care  PSYCHO-SOCIAL/SPIRITUAL:  Support System: Family  Desire for further Chaplaincy support:NO   Additional Recommendations (Limitations, Scope, Preferences):  Avoid Hospitalization, Minimize Medications, Initiate Comfort Feeding, No Artificial Feeding and focus on comfort/DNR  Education on hospice/palliative    PAST MEDICAL HISTORY: Past Medical History:  Diagnosis Date  . Allergy   . Arthritis   . Depression   . Hypertension   . Kyphosis   . Spondylolisthesis 03/18/2017  . Thyroid disease   . Tinnitus     ALLERGIES:  has No Known Allergies.   MEDICATIONS:  Current Facility-Administered Medications  Medication Dose Route Frequency Provider Last Rate Last Admin  . (feeding supplement) PROSource Plus liquid 30 mL  30 mL Oral BID BM Alma Friendly, MD   30 mL at 02/07/21 1418  . acetaminophen (TYLENOL) tablet 650 mg  650 mg Oral Q6H PRN Opyd, Ilene Qua, MD       Or  . acetaminophen (TYLENOL) suppository 650 mg  650 mg Rectal Q6H PRN Opyd, Ilene Qua, MD      . apixaban (ELIQUIS) tablet 10 mg  10 mg Oral BID Leodis Sias T, RPH   10 mg at 02/07/21 0902   Followed by  . [START ON 02/12/2021] apixaban (ELIQUIS) tablet 5 mg  5 mg  Oral BID Eudelia Bunch, RPH      . ascorbic acid (VITAMIN C) tablet 500 mg  500 mg Oral Daily Alma Friendly, MD   500 mg at 02/07/21 0901  . ceFEPIme (MAXIPIME) 2 g in sodium chloride 0.9 % 100 mL IVPB  2 g Intravenous Q8H Opyd, Ilene Qua, MD 200 mL/hr at 02/07/21 1415 2 g at 02/07/21 1415  . chlorhexidine (PERIDEX) 0.12 % solution 15 mL  15 mL Mouth Rinse BID Opyd, Ilene Qua, MD   15 mL at 02/07/21 0902  . Chlorhexidine Gluconate Cloth 2 % PADS 6 each  6 each Topical Daily Opyd, Ilene Qua, MD   6 each at 02/07/21 1220  . divalproex (DEPAKOTE ER) 24 hr tablet 500 mg  500 mg Oral QHS Opyd, Ilene Qua, MD   500 mg at 02/06/21 2210  . feeding supplement (ENSURE ENLIVE / ENSURE PLUS) liquid 237 mL  237 mL Oral BID BM Alma Friendly, MD   237 mL at 02/07/21 1152  . guaiFENesin-dextromethorphan (ROBITUSSIN DM) 100-10 MG/5ML syrup 10 mL  10 mL Oral Q4H PRN Opyd, Ilene Qua, MD      . Ipratropium-Albuterol (COMBIVENT) respimat 1 puff  1 puff Inhalation Q6H Alma Friendly, MD   1 puff at 02/07/21 1419  . levothyroxine (SYNTHROID) tablet 112 mcg  112 mcg Oral Q0600 Eudelia Bunch, RPH   112 mcg at 02/07/21 0551  .  MEDLINE mouth rinse  15 mL Mouth Rinse q12n4p Opyd, Ilene Qua, MD   15 mL at 02/07/21 1420  . multivitamin with minerals tablet 1 tablet  1 tablet Oral Daily Eudelia Bunch, RPH   1 tablet at 02/07/21 0902  . ondansetron (ZOFRAN) tablet 4 mg  4 mg Oral Q6H PRN Opyd, Ilene Qua, MD       Or  . ondansetron (ZOFRAN) injection 4 mg  4 mg Intravenous Q6H PRN Opyd, Ilene Qua, MD      . senna-docusate (Senokot-S) tablet 1 tablet  1 tablet Oral QHS PRN Opyd, Ilene Qua, MD      . traZODone (DESYREL) tablet 50 mg  50 mg Oral QHS Opyd, Ilene Qua, MD   50 mg at 02/06/21 2210  . vancomycin (VANCOCIN) 50 mg/mL oral solution 125 mg  125 mg Oral QID Georgette Shell, MD   125 mg at 02/07/21 1415  . zinc sulfate capsule 220 mg  220 mg Oral Daily Alma Friendly, MD   220 mg at  02/07/21 0903    VITAL SIGNS: BP 119/75 (BP Location: Right Arm)   Pulse 94   Temp (!) 97 F (36.1 C) (Axillary)   Resp 20   Ht _0  (1.88 m)   Wt 77.3 kg   SpO2 93%   BMI 21.88 kg/m  Filed Weights   02/03/21 0129  Weight: 77.3 kg    Estimated body mass index is 21.88 kg/m as calculated from the following:   Height as of this encounter: _1  (1.88 m).   Weight as of this encounter: 77.3 kg.  LABS: CBC:    Component Value Date/Time   WBC 10.6 (H) 02/07/2021 0420   HGB 10.2 (L) 02/07/2021 0420   HGB 14.7 01/11/2020 1354   HCT 31.4 (L) 02/07/2021 0420   HCT 43.8 01/11/2020 1354   PLT 265 02/07/2021 0420   PLT 175 01/11/2020 1354   Comprehensive Metabolic Panel:    Component Value Date/Time   NA 141 02/07/2021 0420   NA 139 01/11/2020 1354   K 3.5 02/07/2021 0420   BUN 8 02/07/2021 0420   BUN 6 (L) 01/11/2020 1354   CREATININE 0.64 02/07/2021 0420   CREATININE 0.94 07/29/2014 0906   ALBUMIN 2.6 (L) 02/03/2021 0155   ALBUMIN 3.7 01/11/2020 1354     Review of Systems  Unable to perform ROS: Dementia   The above conversation was completed via telephone due to the visitor restrictions during the COVID-19 pandemic. Thorough chart review and discussion with necessary members of the care team was completed as part of assessment. All issues were discussed and addressed but no physical exam was performed.  Prognosis: < 6 months  Discharge Planning:  Home with Hospice  Recommendations: . DNR/DNI-as confirmed by daughter . No escalation of care, no artificial feeding tubes/PEG, family requesting patient to return home with hospice support to focus on end-of-life. Comfort feeds.  Marland Kitchen MOST form completed (see above for selections or completed document in [facility]) . Outpatient Hospice support at discharge. Daughter, Judeen Hammans is POC. Requesting lift if possible, bedside table, and transport home.  . Pt will need RX for comfort medications at discharge (Roxanol and Ativan).  TOC referral placed.  Marland Kitchen PMT will continue to support and follow as needed. Please call team line with urgent needs.   Palliative Performance Scale: PPS 20%              Family expressed understanding and was in agreement with  this plan.   Thank you for allowing the Palliative Medicine Team to assist in the care of this patient. Please utilize secure chat with additional questions, if there is no response within 30 minutes please call the above phone number.   Time In: 1430 Time Out: 1535 Time Total: 65 min.   Visit consisted of counseling and education dealing with the complex and emotionally intense issues of symptom management and palliative care in the setting of serious and potentially life-threatening illness.Greater than 50%  of this time was spent counseling and coordinating care related to the above assessment and plan.  Signed by:  Alda Lea, AGPCNP-BC Palliative Medicine Team  Phone: [phone] Pager: [phone] Amion: [place] Team providers are available by phone from 7am to 7pm daily and can be reached through the team cell phone.  Should this patient require assistance outside of these hours, please call the patient's attending physician.

## 2021-02-07 NOTE — Progress Notes (Signed)
Left upper extremity venous duplex has been completed. Preliminary results can be found in CV Proc through chart review.  Results were given to Dr. Rodena Piety.  02/07/21 10:31 AM Larry Cordova RVT

## 2021-02-07 NOTE — Progress Notes (Signed)
  Speech Language Pathology Treatment: Dysphagia  Patient Details Name: Larry Cordova MRN: [id number] DOB: Nov 12, 1930 Today's Date: 02/07/2021 Time: 4163-8453 SLP Time Calculation (min) (ACUTE ONLY): 18 min  Assessment / Plan / Recommendation Clinical Impression  Pt seen side lying in bed with mitts in place. RN reports he consumed Ensure and easily takes his medications with puree - crushed.  His intake otherwise remains poor.  SLP follow up to determine if compensation strategies may faciliate intake to maximize pt's po and improve enjoyment. Trials of various consistenies provided including cracker, icecream, Ensure, and thin soda.  Pt poorly positioned in bed but was amenable to allow SLP to modify his bed in reverse trendelenburg to optimize positioning.  Pt is missing lower anterior dentition which likely impairs his ability to consume regular solids.     With all offering of liquids, pt "swished" before swallowing but there was no indication of aspiration post-swallow.  Graham cracker was masticated and SLP used icecream bolus to aid in oral transit without indication of pharyngeal deficits.  This strategy was conducted x3 during session with success.  Pt's cognitive deficits impair his knowledge of forming seal on straw - therefore tip of straw to mouth effectively prompted pt to produce adequate seal for posterior propulsion with next liquid bolus on 3 trials. SLP Provided thin via tip of straw and straw.  Increased wet gurgly breathing quality noted after thin via straw - and since positioning is concerning, recommend to advance diet to dys3/nectar and allow pt only tip of straw boluses of thin.  Pt does respond to instructions to swallow and clear his throat fortunately!      Recommend advance diet to dys3/nectar - using purees to aid oral transiting of solids pt has masticated.  Per RN, family member will come to see pt tomorrow thus SLP will follow up to provide education to family re:  dementia, dysphagia, compensations, gustatory changes, etc.  Advised Rn to recommendations.and provided signs to be posted in room.   HPI HPI: Larry Cordova is a 87 y.o. male with medical history significant for advanced dementia with behavioral disturbance, hypertension, hypothyroidism, urinary retention with Foley catheter, COVID-19 diagnosed on 01/18/2021, and recent hospitalization with acute renal failure and staph epidermidis bacteremia.      SLP Plan  Continue with current plan of care       Recommendations  Diet recommendations: Dysphagia 3 (mechanical soft);Nectar-thick liquid (straw tip of thin ok) Liquids provided via: Teaspoon Medication Administration: Crushed with puree Supervision: Staff to assist with self feeding (use pillow to aid head postiioning for po intake) Compensations: Minimize environmental distractions;Slow rate;Small sips/bites;Lingual sweep for clearance of pocketing Postural Changes and/or Swallow Maneuvers: Upright 30-60 min after meal (upright as much as able)                Oral Care Recommendations: Oral care BID;Staff/trained caregiver to provide oral care Follow up Recommendations: Skilled Nursing facility;24 hour supervision/assistance SLP Visit Diagnosis: Dysphagia, unspecified (R13.10) Plan: Continue with current plan of care       Larry Cordova, Larry Cordova 02/07/2021, 3:22 PM   Larry Lime, MS [place] Office [phone] Pager [phone]

## 2021-02-08 DIAGNOSIS — A419 Sepsis, unspecified organism: Secondary | ICD-10-CM | POA: Diagnosis not present

## 2021-02-08 LAB — CULTURE, BLOOD (SINGLE)
Culture: NO GROWTH
Culture: NO GROWTH
Special Requests: ADEQUATE
Special Requests: ADEQUATE

## 2021-02-08 LAB — CBC WITH DIFFERENTIAL/PLATELET
Abs Immature Granulocytes: 0.17 10*3/uL — ABNORMAL HIGH (ref 0.00–0.07)
Basophils Absolute: 0.1 10*3/uL (ref 0.0–0.1)
Basophils Relative: 1 %
Eosinophils Absolute: 0.3 10*3/uL (ref 0.0–0.5)
Eosinophils Relative: 2 %
HCT: 32.4 % — ABNORMAL LOW (ref 39.0–52.0)
Hemoglobin: 10.5 g/dL — ABNORMAL LOW (ref 13.0–17.0)
Immature Granulocytes: 1 %
Lymphocytes Relative: 15 %
Lymphs Abs: 1.9 10*3/uL (ref 0.7–4.0)
MCH: 30.3 pg (ref 26.0–34.0)
MCHC: 32.4 g/dL (ref 30.0–36.0)
MCV: 93.6 fL (ref 80.0–100.0)
Monocytes Absolute: 1.5 10*3/uL — ABNORMAL HIGH (ref 0.1–1.0)
Monocytes Relative: 12 %
Neutro Abs: 8.4 10*3/uL — ABNORMAL HIGH (ref 1.7–7.7)
Neutrophils Relative %: 69 %
Platelets: 303 10*3/uL (ref 150–400)
RBC: 3.46 MIL/uL — ABNORMAL LOW (ref 4.22–5.81)
RDW: 13.8 % (ref 11.5–15.5)
WBC: 12.3 10*3/uL — ABNORMAL HIGH (ref 4.0–10.5)
nRBC: 0 % (ref 0.0–0.2)

## 2021-02-08 LAB — CULTURE, BLOOD (ROUTINE X 2): Culture: NO GROWTH

## 2021-02-08 LAB — BASIC METABOLIC PANEL
Anion gap: 7 (ref 5–15)
BUN: 7 mg/dL — ABNORMAL LOW (ref 8–23)
CO2: 25 mmol/L (ref 22–32)
Calcium: 7.8 mg/dL — ABNORMAL LOW (ref 8.9–10.3)
Chloride: 106 mmol/L (ref 98–111)
Creatinine, Ser: 0.66 mg/dL (ref 0.61–1.24)
GFR, Estimated: >60 mL/min (ref >60)
Glucose, Bld: 101 mg/dL — ABNORMAL HIGH (ref 70–99)
Potassium: 3.3 mmol/L — ABNORMAL LOW (ref 3.5–5.1)
Sodium: 138 mmol/L (ref 135–145)

## 2021-02-08 LAB — C-REACTIVE PROTEIN: CRP: 4.1 mg/dL — ABNORMAL HIGH (ref <1.0)

## 2021-02-08 NOTE — Progress Notes (Signed)
PROGRESS NOTE    Larry Cordova  KGM:[id number] DOB: 1930/02/10 DOA: 02/03/2021 PCP: [name], MD    Brief Narrative: HPI from Dr Durward Mallard Handis a 87 y.o.malewith medical history significant foradvanced dementia with behavioral disturbance, HTN, hypothyroidism, urinary retention with Foley catheter, COVID-19 diagnosed on 01/18/2021, and recent hospitalization with acute renal failure and staph epidermidis bacteremia, now presenting to the ED for evaluation of Foley catheter problem. The patient was brought in by his wife after he appeared to have pulled his Foley catheter part of the way out. Family noted some blood in the catheter. Patient is unable to contribute to the history due to his dementia. Discussed with wife who noted that patient has been more confused than his baseline, with poor appetite and has been having diarrhea since his discharge from hospital.  In the ED, patient is found to be in rigors with temperature 102.7, tachypnea, tachycardia, and hypotension. Chest x-ray demonstrates increased regions of patchy and ill-defined opacity. CBC notable for leukocytosis of 24,200. Lactic acid 2.1. Blood and urine culture were collected. Patient was given 30 cc/kgLR, acetaminophen, vancomycin, cefepime, and Flagyl.  Patient admitted for further management.  Assessment & Plan:   Principal Problem:   Sepsis ([place]) Active Problems:   Hypothyroidism   HTN (hypertension)   Dementia with behavioral disturbance ([place])   Acute urinary retention   COVID-19 virus infection   Pressure injury of skin   #1severe sepsis-present on admission secondary to C. difficile colitis and HCAP-chest x-ray/ct chest  showed multifocal bacterial pneumonia/concern for recent Covid infection, UA consistent with UTI though urine culture did not have any growth as of yet.  Blood culture negative. He continues with watery diarrhea with rectal tube in place.  GI panel negative C. difficile toxin  positive. Patient met sepsis criteria on admission with hypotension with a blood pressure of 75/48 tachycardic with a heart rate above 100, febrile with a temp of 103, leukocytosis 24,000 white count, lactic acid 2.1.  He was treated with sepsis protocol, IV fluids vancomycin cefepime . He continues with loose stools and has rectal tube in place.   #2 [id number] diagnosed 01/18/2021 continue isolation through 02/08/2021.crp 4.6 from 11.  #3 history of dementia with behaviors/delirium -worse with sepsis.  Continue Depakote  #4 hypothyroidism continue Synthroid  #5 urinary retention  Flomax on hold and has chronic Foley catheter.  #6 pulmonary embolism CT of the chest showed small age indeterminant PE in the right middle lobe and left upper lobe.  Doppler of the lower extremity showed no evidence of DVT.  He was started on Eliquis.  #7 superficial thrombophlebitis of the left upper extremity no DVT by ultrasound.  #8 multiple pressure ulcers as below present on admission  #9 goals of care discussed with patient's wife and daughter [name].  They are arranging for him to come home.  They were open to have palliative consulted and may be have hospice see him while he is here.  His prognosis is very poor with all the comorbidities and poor functional status.  He is DNR on admission.  Pressure Injury 02/03/21 Tibial Posterior;Right Stage 2 -  Partial thickness loss of dermis presenting as a shallow open injury with a red, pink wound bed without slough. (Active)  02/03/21 1023  Location: Tibial  Location Orientation: Posterior;Right  Staging: Stage 2 -  Partial thickness loss of dermis presenting as a shallow open injury with a red, pink wound bed without slough.  Wound Description (Comments):  Present on Admission: Yes     Pressure Injury 02/03/21 Thigh Left;Lateral Stage 2 -  Partial thickness loss of dermis presenting as a shallow open injury with a red, pink wound bed without slough. (Active)   02/03/21 1026  Location: Thigh  Location Orientation: Left;Lateral  Staging: Stage 2 -  Partial thickness loss of dermis presenting as a shallow open injury with a red, pink wound bed without slough.  Wound Description (Comments):   Present on Admission: Yes     Pressure Injury 02/03/21 Thigh Right;Lateral Unstageable - Full thickness tissue loss in which the base of the injury is covered by slough (yellow, tan, gray, green or brown) and/or eschar (tan, brown or black) in the wound bed. (Active)  02/03/21 1028  Location: Thigh  Location Orientation: Right;Lateral  Staging: Unstageable - Full thickness tissue loss in which the base of the injury is covered by slough (yellow, tan, gray, green or brown) and/or eschar (tan, brown or black) in the wound bed.  Wound Description (Comments):   Present on Admission: Yes      Nutrition Problem: Increased nutrient needs Etiology: acute illness,catabolic illness ([id number] infection)     Signs/Symptoms: estimated needs    Interventions: Ensure Enlive (each supplement provides 350kcal and 20 grams of protein),Prostat,MVI  Estimated body mass index is 21.88 kg/m as calculated from the following:   Height as of this encounter: 6' 2"  (1.88 m).   Weight as of this encounter: 77.3 kg.  DVT prophylaxis: Lovenox  code Status: DO NOT RESUSCITATE Family Communication: Attempted to reach daughter 02/08/2021. Discussed with daughter 02/07/2021. Disposition Plan:  Status is: Inpatient  Dispo: The patient is from: Home              Anticipated d/c is to: Family wants to take him home when he is stable to be discharged.              Patient currently is not medically stable to d/c.   Difficult to place patient no Consultants: None  Procedures: None Antimicrobials: Vancomycin p.o. and cefepime.  Subjective: He was resting in bed when I saw him this morning early. No overnight events were noted. Staff reported he ate breakfast.  Rectal tube and  Foley still in place.  Rectal tube with liquid stools noted. Objective: Vitals:   02/07/21 0705 02/07/21 1246 02/07/21 2118 02/08/21 0605  BP: (!) 126/48 119/75 (!) 145/88 126/81  Pulse: 84 94 (!) 51 76  Resp: 18 20 20  (!) 22  Temp: 97.8 F (36.6 C) (!) 97 F (36.1 C) 98.7 F (37.1 C) 98.1 F (36.7 C)  TempSrc: Oral Axillary Axillary Axillary  SpO2: 96% 93% (!) 87%   Weight:      Height:        Intake/Output Summary (Last 24 hours) at 02/08/2021 1205 Last data filed at 02/08/2021 0946 Gross per 24 hour  Intake 480 ml  Output 500 ml  Net -20 ml   Filed Weights   02/03/21 0129  Weight: 77.3 kg    Examination:  General exam: Appears restless Respiratory system: Clear to auscultation. Respiratory effort normal. Cardiovascular system: S1 & S2 heard, RRR. No JVD, murmurs, rubs, gallops or clicks. No pedal edema. Gastrointestinal system: Abdomen is nondistended, soft and nontender. No organomegaly or masses felt. Normal bowel sounds heard. Central nervous system: Confused moves all extremities  extremities: Left upper extremity 3+ edema Skin: No rashes, lesions or ulcers Psychiatry confused restless   Data Reviewed: I have personally reviewed following  labs and imaging studies  CBC: Recent Labs  Lab 02/04/21 0539 02/04/21 0831 02/05/21 0610 02/06/21 0311 02/07/21 0420 02/08/21 0412  WBC 16.1* 16.2* 12.0* 12.0* 10.6* 12.3*  NEUTROABS 13.1*  --  9.8* 9.3* 6.9 8.4*  HGB 9.8* 10.0* 9.7* 10.5* 10.2* 10.5*  HCT 30.4* 31.5* 29.7* 31.5* 31.4* 32.4*  MCV 96.8 97.2 94.6 93.2 93.7 93.6  PLT 212 233 248 288 265 838   Basic Metabolic Panel: Recent Labs  Lab 02/04/21 0539 02/05/21 0556 02/05/21 0610 02/06/21 0311 02/07/21 0420 02/08/21 0412  NA 139  --  137 141 141 138  K 3.6  --  2.4* 3.8 3.5 3.3*  CL 108  --  113* 108 109 106  CO2 23  --  22 24 27 25   GLUCOSE 88  --  119* 132* 106* 101*  BUN 13  --  9 9 8  7*  CREATININE 0.69  --  0.75 0.55* 0.64 0.66  CALCIUM 7.3*   --  6.7* 7.9* 7.7* 7.8*  MG  --  1.4*  --  2.3  --   --    GFR: Estimated Creatinine Clearance: 69.8 mL/min (by C-G formula based on SCr of 0.66 mg/dL). Liver Function Tests: Recent Labs  Lab 02/03/21 0155  AST 35  ALT 25  ALKPHOS 93  BILITOT 1.1  PROT 6.5  ALBUMIN 2.6*   No results for input(s): LIPASE, AMYLASE in the last 168 hours. No results for input(s): AMMONIA in the last 168 hours. Coagulation Profile: Recent Labs  Lab 02/03/21 0155  INR 1.1   Cardiac Enzymes: No results for input(s): CKTOTAL, CKMB, CKMBINDEX, TROPONINI in the last 168 hours. BNP (last 3 results) No results for input(s): PROBNP in the last 8760 hours. HbA1C: No results for input(s): HGBA1C in the last 72 hours. CBG: No results for input(s): GLUCAP in the last 168 hours. Lipid Profile: No results for input(s): CHOL, HDL, LDLCALC, TRIG, CHOLHDL, LDLDIRECT in the last 72 hours. Thyroid Function Tests: No results for input(s): TSH, T4TOTAL, FREET4, T3FREE, THYROIDAB in the last 72 hours. Anemia Panel: No results for input(s): VITAMINB12, FOLATE, FERRITIN, TIBC, IRON, RETICCTPCT in the last 72 hours. Sepsis Labs: Recent Labs  Lab 02/03/21 0415 02/03/21 0527 02/03/21 1840 02/03/21 0941 02/03/21 1138 02/04/21 0539 02/05/21 0610  PROCALCITON  --  0.19  --   --   --  0.35 0.16  LATICACIDVEN 2.1*  --  2.1* 1.5 1.6  --   --     Recent Results (from the past 240 hour(s))  Blood culture (routine single)     Status: None   Collection Time: 02/03/21  1:34 AM   Specimen: BLOOD  Result Value Ref Range Status   Specimen Description   Final    BLOOD LEFT ANTECUBITAL Performed at [facility], [place] [address] [place], [place] [address]    Special Requests   Final    BOTTLES DRAWN AEROBIC AND ANAEROBIC Blood Culture adequate volume Performed at [place] [address], [place], [place] [address]    Culture   Final    NO GROWTH 5 DAYS Performed at [facility], [place] [address] [place], [place] [address]    Report Status 02/08/2021 FINAL  Final  Urine culture     Status: None   Collection Time: 02/03/21  3:00 AM   Specimen: In/Out Cath Urine  Result Value Ref Range Status   Specimen Description   Final    IN/OUT CATH  URINE Performed at [facility], [place] [address], [place], [place] [address]    Special Requests   Final    NONE Performed at [facility], [place] [address], [place], [place] [address]    Culture   Final    NO GROWTH Performed at [facility], [place] [address] [place], [place] [address]    Report Status 02/05/2021 FINAL  Final  MRSA PCR Screening     Status: None   Collection Time: 02/03/21  8:51 AM   Specimen: Nasal Mucosa; Nasopharyngeal  Result Value Ref Range Status   MRSA by PCR NEGATIVE NEGATIVE Final    Comment:        The GeneXpert MRSA Assay (FDA approved for NASAL specimens only), is one component of a comprehensive MRSA colonization surveillance program. It is not intended to diagnose MRSA infection nor to guide or monitor treatment for MRSA infections. Performed at [facility], [place] [address] [place], [place] [address]   Culture, blood (single)     Status: None   Collection Time: 02/03/21  9:41 AM   Specimen: BLOOD  Result Value Ref Range Status   Specimen Description   Final    BLOOD RIGHT ANTECUBITAL Performed at [place] [address] [place], [place] [address]    Special Requests   Final    BOTTLES DRAWN AEROBIC ONLY Blood Culture adequate volume Performed at [place] [address], [place], [place] [address]    Culture   Final    NO GROWTH 5 DAYS Performed at [facility], [place] [address], [place], [place] [address]    Report Status 02/08/2021 FINAL  Final  Culture, blood (x 2)     Status: None   Collection Time: 02/03/21  9:41 AM   Specimen: BLOOD  Result  Value Ref Range Status   Specimen Description   Final    BLOOD LEFT ANTECUBITAL Performed at [place] [address] [place], [place] [address]    Special Requests   Final    BOTTLES DRAWN AEROBIC ONLY LEFT ANTECUBITAL Performed at [place] [address], [place], [place] [address]    Culture   Final    NO GROWTH 5 DAYS Performed at [facility], [place] [address], [place], [place] [address]    Report Status 02/08/2021 FINAL  Final  C Difficile Quick Screen w PCR reflex     Status: Abnormal   Collection Time: 02/03/21  3:20 PM   Specimen: STOOL  Result Value Ref Range Status   C Diff antigen POSITIVE (A) NEGATIVE Final   C Diff toxin POSITIVE (A) NEGATIVE Final   C Diff interpretation Toxin producing C. difficile detected.  Final    Comment: RBV TO JANEE Performed at [facility], [place] [address] [place],  [address]   Gastrointestinal Panel by PCR , Stool     Status: None   Collection Time: 02/03/21  3:20 PM   Specimen: STOOL  Result Value Ref Range Status   Campylobacter species NOT DETECTED NOT DETECTED Final   Plesimonas shigelloides NOT DETECTED NOT DETECTED Final   Salmonella species NOT DETECTED NOT DETECTED Final   Yersinia enterocolitica NOT DETECTED NOT DETECTED Final   Vibrio species NOT DETECTED NOT DETECTED Final   Vibrio cholerae NOT DETECTED NOT DETECTED Final   Enteroaggregative E coli (EAEC) NOT DETECTED NOT DETECTED Final   Enteropathogenic E  coli (EPEC) NOT DETECTED NOT DETECTED Final   Enterotoxigenic E coli (ETEC) NOT DETECTED NOT DETECTED Final   Shiga like toxin producing E coli (STEC) NOT DETECTED NOT DETECTED Final   Shigella/Enteroinvasive E coli (EIEC) NOT DETECTED NOT DETECTED Final   Cryptosporidium NOT DETECTED NOT DETECTED Final   Cyclospora cayetanensis NOT DETECTED NOT DETECTED Final   Entamoeba histolytica NOT DETECTED NOT DETECTED Final   Giardia lamblia NOT  DETECTED NOT DETECTED Final   Adenovirus F40/41 NOT DETECTED NOT DETECTED Final   Astrovirus NOT DETECTED NOT DETECTED Final   Norovirus GI/GII NOT DETECTED NOT DETECTED Final   Rotavirus A NOT DETECTED NOT DETECTED Final   Sapovirus (I, II, IV, and V) NOT DETECTED NOT DETECTED Final    Comment: Performed at [facility], [address] [place], [place] [address]         Radiology Studies: VAS [place] UPPER EXTREMITY VENOUS DUPLEX  Result Date: 02/07/2021 UPPER VENOUS STUDY  Indications: Edema Risk Factors: COVID 19 positive. Limitations: Bandages, line and patient positioning, patient movement. Comparison Study: No prior studies. Performing Technologist: Oliver Hum RVT  Examination Guidelines: A complete evaluation includes B-mode imaging, spectral Doppler, color Doppler, and power Doppler as needed of all accessible portions of each vessel. Bilateral testing is considered an integral part of a complete examination. Limited examinations for reoccurring indications may be performed as noted.  Right Findings: +----------+------------+---------+-----------+----------+-------+ RIGHT     CompressiblePhasicitySpontaneousPropertiesSummary +----------+------------+---------+-----------+----------+-------+ Subclavian    Full       Yes       Yes                      +----------+------------+---------+-----------+----------+-------+  Left Findings: +----------+------------+---------+-----------+----------+-------+ LEFT      CompressiblePhasicitySpontaneousPropertiesSummary +----------+------------+---------+-----------+----------+-------+ IJV           Full       Yes       Yes                      +----------+------------+---------+-----------+----------+-------+ Subclavian    Full       Yes       Yes                      +----------+------------+---------+-----------+----------+-------+ Axillary      Full       Yes       Yes                       +----------+------------+---------+-----------+----------+-------+ Brachial      Full       Yes       Yes                      +----------+------------+---------+-----------+----------+-------+ Radial        Full                                          +----------+------------+---------+-----------+----------+-------+ Ulnar         Full                                          +----------+------------+---------+-----------+----------+-------+ Cephalic      None  Acute  +----------+------------+---------+-----------+----------+-------+ Basilic       Full                                          +----------+------------+---------+-----------+----------+-------+  Summary:  Right: No evidence of thrombosis in the subclavian.  Left: No evidence of deep vein thrombosis in the upper extremity. Findings consistent with acute superficial vein thrombosis involving the left cephalic vein.  *See table(s) above for measurements and observations.  Diagnosing physician: Curt Jews MD Electronically signed by Curt Jews MD on 02/07/2021 at 7:20:39 PM.    Final         Scheduled Meds: . (feeding supplement) PROSource Plus  30 mL Oral BID BM  . apixaban  10 mg Oral BID   Followed by  . [START ON 02/12/2021] apixaban  5 mg Oral BID  . vitamin C  500 mg Oral Daily  . chlorhexidine  15 mL Mouth Rinse BID  . Chlorhexidine Gluconate Cloth  6 each Topical Daily  . divalproex  500 mg Oral QHS  . feeding supplement  237 mL Oral BID BM  . Ipratropium-Albuterol  1 puff Inhalation Q6H  . levothyroxine  112 mcg Oral Q0600  . mouth rinse  15 mL Mouth Rinse q12n4p  . multivitamin with minerals  1 tablet Oral Daily  . traZODone  50 mg Oral QHS  . vancomycin  125 mg Oral QID  . zinc sulfate  220 mg Oral Daily   Continuous Infusions:    LOS: 5 days   Georgette Shell, MD 02/08/2021, 12:05 PM

## 2021-02-08 NOTE — Progress Notes (Signed)
Pharmacy: Eliquis  Patient is an 87 y.o M currently on Eliquis for acute PE.  - 2/25 LE doppler: neg DVT - 3/1 UE doppler: acute superficial vein thrombosis involving the left cephalic vein  Today, 0/01/9846: - cbc stable - no bleeding documented - scr 0.66 (crcl~ 70)  Plan: - continue with Eliquis 10 mg bid x7 days then 5 mg bid  (to start on 3/6) - pharmacy will sign off for Eliquis, but will follow patient peripherally along with you.  Dia Sitter, PharmD, BCPS 02/08/2021 9:09 AM

## 2021-02-09 DIAGNOSIS — A419 Sepsis, unspecified organism: Secondary | ICD-10-CM | POA: Diagnosis not present

## 2021-02-09 DIAGNOSIS — R338 Other retention of urine: Secondary | ICD-10-CM | POA: Diagnosis not present

## 2021-02-09 DIAGNOSIS — F0391 Unspecified dementia with behavioral disturbance: Secondary | ICD-10-CM | POA: Diagnosis not present

## 2021-02-09 DIAGNOSIS — U071 COVID-19: Secondary | ICD-10-CM | POA: Diagnosis not present

## 2021-02-09 LAB — CBC WITH DIFFERENTIAL/PLATELET
Abs Immature Granulocytes: 0.28 10*3/uL — ABNORMAL HIGH (ref 0.00–0.07)
Basophils Absolute: 0.1 10*3/uL (ref 0.0–0.1)
Basophils Relative: 1 %
Eosinophils Absolute: 0.3 10*3/uL (ref 0.0–0.5)
Eosinophils Relative: 2 %
HCT: 30.5 % — ABNORMAL LOW (ref 39.0–52.0)
Hemoglobin: 9.8 g/dL — ABNORMAL LOW (ref 13.0–17.0)
Immature Granulocytes: 2 %
Lymphocytes Relative: 15 %
Lymphs Abs: 2 10*3/uL (ref 0.7–4.0)
MCH: 30.6 pg (ref 26.0–34.0)
MCHC: 32.1 g/dL (ref 30.0–36.0)
MCV: 95.3 fL (ref 80.0–100.0)
Monocytes Absolute: 1.6 10*3/uL — ABNORMAL HIGH (ref 0.1–1.0)
Monocytes Relative: 12 %
Neutro Abs: 8.9 10*3/uL — ABNORMAL HIGH (ref 1.7–7.7)
Neutrophils Relative %: 68 %
Platelets: 305 10*3/uL (ref 150–400)
RBC: 3.2 MIL/uL — ABNORMAL LOW (ref 4.22–5.81)
RDW: 14.1 % (ref 11.5–15.5)
WBC: 13.1 10*3/uL — ABNORMAL HIGH (ref 4.0–10.5)
nRBC: 0 % (ref 0.0–0.2)

## 2021-02-09 MED ORDER — IPRATROPIUM-ALBUTEROL 20-100 MCG/ACT IN AERS: 1.0000 | INHALATION_SPRAY | Freq: Four times a day (QID) | RESPIRATORY_TRACT | Status: DC | PRN

## 2021-02-09 MED ORDER — ENSURE ENLIVE PO LIQD: 237.0000 mL | Freq: Two times a day (BID) | ORAL | Status: DC | PRN

## 2021-02-09 NOTE — Progress Notes (Addendum)
   Daily Progress Note   Patient Name: Larry Cordova       Date: 02/09/2021 DOB: 1930/10/09  Age: 87 y.o. MRN#: [id number] Attending Physician: Georgette Shell, MD Primary Care Physician: [name], MD Admit Date: 02/03/2021  Reason for Consultation/Follow-up: Establishing goals of care, Non pain symptom management, Pain control and Psychosocial/spiritual support  Chart Reviewed and Updates Received.   No acute distress. Remains confused.   Wife and daughter has confirmed wishes for patient to return home with outpatient hospice support. Daughter is a Therapist, sports and will be assisting her mother in patient's care in addition to hospice support. Family confirms patient's continued decline and poor quality of life over the past few months. Request for continued care to treat the treatable with no escalation in care or aggressive interventions.   Education provided on outpatient hospice support and the referral process.   MOST form has been completed and to be printed for family.   All questions have been answered and support provided.   Length of Stay: 6 days  Vital Signs: BP 124/70 (BP Location: Right Arm)   Pulse 86   Temp 98.9 F (37.2 C) (Axillary)   Resp 20   Ht 6\' 2"  (1.88 m)   Wt 80.4 kg   SpO2 92%   BMI 22.76 kg/m  SpO2: SpO2: 92 % O2 Device: O2 Device: Room Air O2 Flow Rate:    Palliative Care Assessment & Plan  HPI: Palliative Care consult requested for goals of care discussion in this 87 y.o. male with multiple medical problems including advanced dementia with behavioral disturbance, hypertension, hypothyroidism, urinary retention (chronic Foley catheter), kyphosis, COVID-19 (01/18/2021), and recent hospitalization with acute renal failure and staph epidermidis bacteremia. Patient presenting to ED from home with complaints of foley catheter dislodgement and family noted some blood in catheter. Since admission patient receiving treatment for severe sepsis secondary to  colitis and HCAP.   Code Status: DNR  Goals of Care/Recommendations: DNR/DNI MOST completed and on file in [place]. RN to print and place in chart to send home with patient.  Family acknowledges patient's continued decline and poor QOL. Request to continue to treat the treatable and discharge home with outpatient hospice support once medical care has been optimized. Goal is to focus on patient's comfort with hospice support for what time he has left at discharge. No rehospitalization.  Patient will need RX for Roxanol/Ativan for comfort meds at discharge.  Patient to discharge home with foley catheter in place.  Outpatient hospice referral placed on 3/1. Mrs. Lorenza Chick, CM [facility]) aware.  PMT will continue to support and follow as needed. I will be off service today returning on Sunday. Please call team line with urgent needs.   Prognosis: POOR 6 months or less.   Discharge Planning: Home with Hospice  Thank you for allowing the Palliative Medicine Team to assist in the care of this patient.  Time Total: 35 min.   Visit consisted of counseling and education dealing with the complex and emotionally intense issues of symptom management and palliative care in the setting of serious and potentially life-threatening illness.Greater than 50%  of this time was spent counseling and coordinating care related to the above assessment and plan.  Alda Lea, AGPCNP-BC  Palliative Medicine Team [phone]

## 2021-02-09 NOTE — Progress Notes (Signed)
Shift Summary:    Refused parts of care, remained alert tos self and wife. Ate 25-50% of meals during this shift. Slept most of the shift. Wife visited during this shift. Foley remains dry intact, in place. Rectal tube remains dry, intact and in place. Medicated x 1 for pain. Seen by speech during this shift. No other needs identified. Will continue to monitor.

## 2021-02-09 NOTE — Progress Notes (Signed)
PROGRESS NOTE    Elion R Dlouhy  VQQ:[id number] DOB: [birth year] DOA: 02/03/2021 PCP: [name], MD    Brief Narrative: HPI from Dr Durward Mallard Handis a 87 y.o.malewith medical history significant foradvanced dementia with behavioral disturbance, HTN, hypothyroidism, urinary retention with Foley catheter, COVID-19 diagnosed on 01/18/2021, and recent hospitalization with acute renal failure and staph epidermidis bacteremia, now presenting to the ED for evaluation of Foley catheter problem. The patient was brought in by his wife after he appeared to have pulled his Foley catheter part of the way out. Family noted some blood in the catheter. Patient is unable to contribute to the history due to his dementia. Discussed with wife who noted that patient has been more confused than his baseline, with poor appetite and has been having diarrhea since his discharge from hospital.  In the ED, patient is found to be in rigors with temperature 102.7, tachypnea, tachycardia, and hypotension. Chest x-ray demonstrates increased regions of patchy and ill-defined opacity. CBC notable for leukocytosis of 24,200. Lactic acid 2.1. Blood and urine culture were collected. Patient was given 30 cc/kgLR, acetaminophen, vancomycin, cefepime, and Flagyl.  Patient admitted for further management.  Assessment & Plan:   Principal Problem:   Sepsis ([place]) Active Problems:   Hypothyroidism   HTN (hypertension)   Dementia with behavioral disturbance ([place])   Acute urinary retention   COVID-19 virus infection   Pressure injury of skin   #1severe sepsis-present on admission secondary to C. difficile colitis and HCAP-chest x-ray/ct chest  showed multifocal bacterial pneumonia/concern for recent Covid infection, UA consistent with UTI though urine culture did not have any growth as of yet.  Blood culture negative. He continues with watery diarrhea with rectal tube in place.  GI panel negative C. difficile toxin  positive. Patient met sepsis criteria on admission with hypotension with a blood pressure of 75/48 tachycardic with a heart rate above 100, febrile with a temp of 103, leukocytosis 24,000 white count, lactic acid 2.1.  He was treated with sepsis protocol, IV fluids vancomycin cefepime . He continues with loose stools and has rectal tube in place.   #2 EPPIR-51 diagnosed 01/18/2021 continue isolation through 02/08/2021.crp 4.6 from 11.  #3 history of dementia with behaviors/delirium -worse with sepsis.  Continue Depakote  #4 hypothyroidism continue Synthroid  #5 urinary retention  Flomax on hold and has chronic Foley catheter.  #6 pulmonary embolism CT of the chest showed small age indeterminant PE in the right middle lobe and left upper lobe.  Doppler of the lower extremity showed no evidence of DVT.  He was started on Eliquis.  #7 superficial thrombophlebitis of the left upper extremity no DVT by ultrasound.  #8 multiple pressure ulcers as below present on admission  #9 goals of care discussed with patient's wife and daughter [name].  They are arranging for him to come home.  They were open to have palliative consulted and may be have hospice see him while he is here.  His prognosis is very poor with all the comorbidities and poor functional status.  He is DNR on admission.  Pressure Injury 02/03/21 Tibial Posterior;Right Stage 2 -  Partial thickness loss of dermis presenting as a shallow open injury with a red, pink wound bed without slough. (Active)  02/03/21 1023  Location: Tibial  Location Orientation: Posterior;Right  Staging: Stage 2 -  Partial thickness loss of dermis presenting as a shallow open injury with a red, pink wound bed without slough.  Wound Description (Comments):  Present on Admission: Yes     Pressure Injury 02/03/21 Thigh Left;Lateral Stage 2 -  Partial thickness loss of dermis presenting as a shallow open injury with a red, pink wound bed without slough. (Active)   02/03/21 1026  Location: Thigh  Location Orientation: Left;Lateral  Staging: Stage 2 -  Partial thickness loss of dermis presenting as a shallow open injury with a red, pink wound bed without slough.  Wound Description (Comments):   Present on Admission: Yes     Pressure Injury 02/03/21 Thigh Right;Lateral Unstageable - Full thickness tissue loss in which the base of the injury is covered by slough (yellow, tan, gray, green or brown) and/or eschar (tan, brown or black) in the wound bed. (Active)  02/03/21 1028  Location: Thigh  Location Orientation: Right;Lateral  Staging: Unstageable - Full thickness tissue loss in which the base of the injury is covered by slough (yellow, tan, gray, green or brown) and/or eschar (tan, brown or black) in the wound bed.  Wound Description (Comments):   Present on Admission: Yes      Nutrition Problem: Increased nutrient needs Etiology: acute illness,catabolic illness ([id number]-35 infection)     Signs/Symptoms: estimated needs    Interventions: Ensure Enlive (each supplement provides 350kcal and 20 grams of protein),Prostat,MVI  Estimated body mass index is 22.76 kg/m as calculated from the following:   Height as of this encounter: 6' 2"  (1.88 m).   Weight as of this encounter: 80.4 kg.  DVT prophylaxis: Lovenox  code Status: DO NOT RESUSCITATE Family Communication: Attempted to reach daughter 02/08/2021. Discussed with daughter 02/07/2021. Disposition Plan:  Status is: Inpatient  Dispo: The patient is from: Home              Anticipated d/c is to: Family wants to take him home when he is stable to be discharged.              Patient currently is not medically stable to d/c.   Difficult to place patient no Consultants: None  Procedures: None Antimicrobials: Vancomycin p.o. and cefepime.  Subjective: Patient is resting in bed.  When I called his name he opened his eyes.  When I asked him already doing okay he noted his left okay and went  back right to sleep.  No events overnight reported from the nursing staff.  He continues with rectal tube in place with profuse diarrhea. Objective: Vitals:   02/08/21 1355 02/08/21 2048 02/09/21 0407 02/09/21 0500  BP: (!) 118/42 135/90 124/70   Pulse: 74 91 86   Resp: (!) 22 20 20    Temp: 98.3 F (36.8 C) 98.9 F (37.2 C) 98.9 F (37.2 C)   TempSrc: Axillary Oral Axillary   SpO2: 91% 95% 92%   Weight:    80.4 kg  Height:        Intake/Output Summary (Last 24 hours) at 02/09/2021 1314 Last data filed at 02/09/2021 0400 Gross per 24 hour  Intake 240 ml  Output 1675 ml  Net -1435 ml   Filed Weights   02/03/21 0129 02/09/21 0500  Weight: 77.3 kg 80.4 kg    Examination:  General exam: Appears restless Respiratory system: Clear to auscultation. Respiratory effort normal. Cardiovascular system: S1 & S2 heard, RRR. No JVD, murmurs, rubs, gallops or clicks. No pedal edema. Gastrointestinal system: Abdomen is nondistended, soft and nontender. No organomegaly or masses felt. Normal bowel sounds heard. Central nervous system: Confused moves all extremities  extremities: Left upper extremity 3+ edema Skin: No rashes,  lesions or ulcers Psychiatry confused restless   Data Reviewed: I have personally reviewed following labs and imaging studies  CBC: Recent Labs  Lab 02/05/21 0610 02/06/21 0311 02/07/21 0420 02/08/21 0412 02/09/21 0422  WBC 12.0* 12.0* 10.6* 12.3* 13.1*  NEUTROABS 9.8* 9.3* 6.9 8.4* 8.9*  HGB 9.7* 10.5* 10.2* 10.5* 9.8*  HCT 29.7* 31.5* 31.4* 32.4* 30.5*  MCV 94.6 93.2 93.7 93.6 95.3  PLT 248 288 265 303 280   Basic Metabolic Panel: Recent Labs  Lab 02/04/21 0539 02/05/21 0556 02/05/21 0610 02/06/21 0311 02/07/21 0420 02/08/21 0412  NA 139  --  137 141 141 138  K 3.6  --  2.4* 3.8 3.5 3.3*  CL 108  --  113* 108 109 106  CO2 23  --  22 24 27 25   GLUCOSE 88  --  119* 132* 106* 101*  BUN 13  --  9 9 8  7*  CREATININE 0.69  --  0.75 0.55* 0.64 0.66   CALCIUM 7.3*  --  6.7* 7.9* 7.7* 7.8*  MG  --  1.4*  --  2.3  --   --    GFR: Estimated Creatinine Clearance: 72.6 mL/min (by C-G formula based on SCr of 0.66 mg/dL). Liver Function Tests: Recent Labs  Lab 02/03/21 0155  AST 35  ALT 25  ALKPHOS 93  BILITOT 1.1  PROT 6.5  ALBUMIN 2.6*   No results for input(s): LIPASE, AMYLASE in the last 168 hours. No results for input(s): AMMONIA in the last 168 hours. Coagulation Profile: Recent Labs  Lab 02/03/21 0155  INR 1.1   Cardiac Enzymes: No results for input(s): CKTOTAL, CKMB, CKMBINDEX, TROPONINI in the last 168 hours. BNP (last 3 results) No results for input(s): PROBNP in the last 8760 hours. HbA1C: No results for input(s): HGBA1C in the last 72 hours. CBG: No results for input(s): GLUCAP in the last 168 hours. Lipid Profile: No results for input(s): CHOL, HDL, LDLCALC, TRIG, CHOLHDL, LDLDIRECT in the last 72 hours. Thyroid Function Tests: No results for input(s): TSH, T4TOTAL, FREET4, T3FREE, THYROIDAB in the last 72 hours. Anemia Panel: No results for input(s): VITAMINB12, FOLATE, FERRITIN, TIBC, IRON, RETICCTPCT in the last 72 hours. Sepsis Labs: Recent Labs  Lab 02/03/21 0415 02/03/21 0527 02/03/21 0349 02/03/21 0941 02/03/21 1138 02/04/21 0539 02/05/21 0610  PROCALCITON  --  0.19  --   --   --  0.35 0.16  LATICACIDVEN 2.1*  --  2.1* 1.5 1.6  --   --     Recent Results (from the past 240 hour(s))  Blood culture (routine single)     Status: None   Collection Time: 02/03/21  1:34 AM   Specimen: BLOOD  Result Value Ref Range Status   Specimen Description   Final    BLOOD LEFT ANTECUBITAL Performed at [facility], [place] [address] [place], [place] [address]    Special Requests   Final    BOTTLES DRAWN AEROBIC AND ANAEROBIC Blood Culture adequate volume Performed at [place] [address] [place], [place] [address]    Culture   Final    NO GROWTH 5  DAYS Performed at [facility], [place] [address], [place],  [address]    Report Status 02/08/2021 FINAL  Final  Urine culture     Status: None   Collection Time: 02/03/21  3:00 AM   Specimen: In/Out Cath Urine  Result Value Ref Range Status   Specimen Description   Final  IN/OUT CATH URINE Performed at [facility], [place] [address], [place], [place] [address]    Special Requests   Final    NONE Performed at [facility], [place] [address], [place], [place] [address]    Culture   Final    NO GROWTH Performed at [facility], [place] [address], [place], [place] [address]    Report Status 02/05/2021 FINAL  Final  MRSA PCR Screening     Status: None   Collection Time: 02/03/21  8:51 AM   Specimen: Nasal Mucosa; Nasopharyngeal  Result Value Ref Range Status   MRSA by PCR NEGATIVE NEGATIVE Final    Comment:        The GeneXpert MRSA Assay (FDA approved for NASAL specimens only), is one component of a comprehensive MRSA colonization surveillance program. It is not intended to diagnose MRSA infection nor to guide or monitor treatment for MRSA infections. Performed at [facility], [place] [address] [place], [place] [address]   Culture, blood (single)     Status: None   Collection Time: 02/03/21  9:41 AM   Specimen: BLOOD  Result Value Ref Range Status   Specimen Description   Final    BLOOD RIGHT ANTECUBITAL Performed at [place] [address], [place], [place] [address]    Special Requests   Final    BOTTLES DRAWN AEROBIC ONLY Blood Culture adequate volume Performed at [place] [address] [place], [place] [address]    Culture   Final    NO GROWTH 5 DAYS Performed at [facility], [place] [address], [place], [place] [address]    Report Status 02/08/2021 FINAL  Final  Culture, blood (x 2)     Status: None   Collection Time: 02/03/21  9:41 AM    Specimen: BLOOD  Result Value Ref Range Status   Specimen Description   Final    BLOOD LEFT ANTECUBITAL Performed at [place] [address] [place], [place] [address]    Special Requests   Final    BOTTLES DRAWN AEROBIC ONLY LEFT ANTECUBITAL Performed at [place] [address] [place], [place] [address]    Culture   Final    NO GROWTH 5 DAYS Performed at [facility], [place] [address] [place], [place] [address]    Report Status 02/08/2021 FINAL  Final  C Difficile Quick Screen w PCR reflex     Status: Abnormal   Collection Time: 02/03/21  3:20 PM   Specimen: STOOL  Result Value Ref Range Status   C Diff antigen POSITIVE (A) NEGATIVE Final   C Diff toxin POSITIVE (A) NEGATIVE Final   C Diff interpretation Toxin producing C. difficile detected.  Final    Comment: RBV TO JANEE Performed at [facility], [place] [address] [place], [place] [address]   Gastrointestinal Panel by PCR , Stool     Status: None   Collection Time: 02/03/21  3:20 PM   Specimen: STOOL  Result Value Ref Range Status   Campylobacter species NOT DETECTED NOT DETECTED Final   Plesimonas shigelloides NOT DETECTED NOT DETECTED Final   Salmonella species NOT DETECTED NOT DETECTED Final   Yersinia enterocolitica NOT DETECTED NOT DETECTED Final   Vibrio species NOT DETECTED NOT DETECTED Final   Vibrio cholerae NOT DETECTED NOT DETECTED Final   Enteroaggregative E coli (EAEC) NOT DETECTED NOT DETECTED Final  Enteropathogenic E coli (EPEC) NOT DETECTED NOT DETECTED Final   Enterotoxigenic E coli (ETEC) NOT DETECTED NOT DETECTED Final   Shiga like toxin producing E coli (STEC) NOT DETECTED NOT DETECTED Final   Shigella/Enteroinvasive E coli (EIEC) NOT DETECTED NOT DETECTED Final   Cryptosporidium NOT DETECTED NOT DETECTED Final   Cyclospora cayetanensis NOT DETECTED NOT DETECTED Final   Entamoeba histolytica NOT DETECTED NOT DETECTED Final    Giardia lamblia NOT DETECTED NOT DETECTED Final   Adenovirus F40/41 NOT DETECTED NOT DETECTED Final   Astrovirus NOT DETECTED NOT DETECTED Final   Norovirus GI/GII NOT DETECTED NOT DETECTED Final   Rotavirus A NOT DETECTED NOT DETECTED Final   Sapovirus (I, II, IV, and V) NOT DETECTED NOT DETECTED Final    Comment: Performed at [facility], [address] [place], [place] [address]         Radiology Studies: No results found.      Scheduled Meds: . (feeding supplement) PROSource Plus  30 mL Oral BID BM  . apixaban  10 mg Oral BID   Followed by  . [START ON 02/12/2021] apixaban  5 mg Oral BID  . chlorhexidine  15 mL Mouth Rinse BID  . Chlorhexidine Gluconate Cloth  6 each Topical Daily  . divalproex  500 mg Oral QHS  . levothyroxine  112 mcg Oral Q0600  . mouth rinse  15 mL Mouth Rinse q12n4p  . traZODone  50 mg Oral QHS  . vancomycin  125 mg Oral QID   Continuous Infusions:    LOS: 6 days   Georgette Shell, MD 02/09/2021, 1:14 PM

## 2021-02-09 NOTE — Progress Notes (Signed)
  Speech Language Pathology Treatment: Dysphagia  Patient Details Name: Larry Cordova MRN: [id number] DOB: 1930-05-29 Today's Date: 02/09/2021 Time: 7867-6720 SLP Time Calculation (min) (ACUTE ONLY): 10 min  Assessment / Plan / Recommendation Clinical Impression  Today pt noted to have increased wet vocal quality, decreased participation in SLP session - no following directions to clear his throat.  He was found leaning to the left with his head on the call bell. Allowed SLP to reposition him but winced concerning for discomfort.  Pt is very stiff likely due to his dementia which causes significant challenges with his care.  Pt did not allow SLP to remove upper partial however allowed SLP to brush his teeth.  Removed a large bolus of chicken (approx 2 inches long and 1 inch wide) from upper labial/gum region.  Pt with poor mentation currently.  Pt did elicit a swallow with small bolus of icecream and tsp of nectar juice - no overt indication of aspiration with few boluses.  SLP provided boluses in hopes to help clear secretions but this was not effective.  Modified diet back to dys1/nectar. will follow up next date to educate family to dementia, dysphagia, importance of not feeding pt if lethargic, etc.  SLP is concerned for pt's abiltiy to meet nutritional needs, aspiration risk etc with current medical status.  Question if pt would benefit from consideration for OP hospice facility>  Messaged palliative to inquire.   HPI HPI: Larry Cordova is a 87 y.o. male with medical history significant for advanced dementia with behavioral disturbance, hypertension, hypothyroidism, urinary retention with Foley catheter, COVID-19 diagnosed on 01/18/2021, and recent hospitalization with acute renal failure and staph epidermidis bacteremia.  Follow up indicated to assess po tolerance and for family education.      SLP Plan  Continue with current plan of care       Recommendations  Diet recommendations:  Nectar-thick liquid;Dysphagia 1 (puree) Liquids provided via: Teaspoon Medication Administration: Crushed with puree Supervision: Staff to assist with self feeding (use pillow to reposition for po intake) Compensations: Minimize environmental distractions;Slow rate;Small sips/bites;Lingual sweep for clearance of pocketing Postural Changes and/or Swallow Maneuvers: Upright 30-60 min after meal;Seated upright 90 degrees;Out of bed for meals                Oral Care Recommendations: Oral care BID;Staff/trained caregiver to provide oral care SLP Visit Diagnosis: Dysphagia, unspecified (R13.10) Plan: Continue with current plan of care       [place], Larry Cordova 02/09/2021, 4:51 PM   Larry Lime, MS [facility] SLP [place] Office [phone] Pager [phone]

## 2021-02-09 NOTE — Plan of Care (Signed)
Resting in bed, alert to self, RR even and unlabored. Bilateral mittings in place. Chronic foley clean dry intact, rectal tube, remains in intact and in place. Wife at bedside. Bed low and locked. Call and attempted to give update to daughter, per daughter, "the NP had call and gave update already." No other needs identified. Will continue to monitor.   Problem: Education: Goal: Knowledge of General Education information will improve Description: Including pain rating scale, medication(s)/side effects and non-pharmacologic comfort measures Outcome: Progressing   Problem: Health Behavior/Discharge Planning: Goal: Ability to manage health-related needs will improve Outcome: Progressing   Problem: Clinical Measurements: Goal: Ability to maintain clinical measurements within normal limits will improve Outcome: Progressing Goal: Will remain free from infection Outcome: Progressing Goal: Diagnostic test results will improve Outcome: Progressing Goal: Respiratory complications will improve Outcome: Progressing Goal: Cardiovascular complication will be avoided Outcome: Progressing   Problem: Activity: Goal: Risk for activity intolerance will decrease Outcome: Progressing   Problem: Nutrition: Goal: Adequate nutrition will be maintained Outcome: Progressing   Problem: Coping: Goal: Level of anxiety will decrease Outcome: Progressing   Problem: Elimination: Goal: Will not experience complications related to bowel motility Outcome: Progressing   Problem: Pain Managment: Goal: General experience of comfort will improve Outcome: Progressing   Problem: Safety: Goal: Ability to remain free from injury will improve Outcome: Progressing   Problem: Skin Integrity: Goal: Risk for impaired skin integrity will decrease Outcome: Progressing   Problem: Education: Goal: Knowledge of risk factors and measures for prevention of condition will improve Outcome: Progressing   Problem:  Coping: Goal: Psychosocial and spiritual needs will be supported Outcome: Progressing   Problem: Respiratory: Goal: Will maintain a patent airway Outcome: Progressing Goal: Complications related to the disease process, condition or treatment will be avoided or minimized Outcome: Progressing

## 2021-02-10 ENCOUNTER — Telehealth: Payer: Self-pay | Admitting: Family Medicine

## 2021-02-10 DIAGNOSIS — A419 Sepsis, unspecified organism: Secondary | ICD-10-CM | POA: Diagnosis not present

## 2021-02-10 LAB — CBC WITH DIFFERENTIAL/PLATELET
Abs Immature Granulocytes: 0.37 10*3/uL — ABNORMAL HIGH (ref 0.00–0.07)
Basophils Absolute: 0.1 10*3/uL (ref 0.0–0.1)
Basophils Relative: 1 %
Eosinophils Absolute: 0.4 10*3/uL (ref 0.0–0.5)
Eosinophils Relative: 4 %
HCT: 32.6 % — ABNORMAL LOW (ref 39.0–52.0)
Hemoglobin: 10.3 g/dL — ABNORMAL LOW (ref 13.0–17.0)
Immature Granulocytes: 4 %
Lymphocytes Relative: 18 %
Lymphs Abs: 1.8 10*3/uL (ref 0.7–4.0)
MCH: 30.6 pg (ref 26.0–34.0)
MCHC: 31.6 g/dL (ref 30.0–36.0)
MCV: 96.7 fL (ref 80.0–100.0)
Monocytes Absolute: 1.4 10*3/uL — ABNORMAL HIGH (ref 0.1–1.0)
Monocytes Relative: 14 %
Neutro Abs: 5.9 10*3/uL (ref 1.7–7.7)
Neutrophils Relative %: 59 %
Platelets: 302 10*3/uL (ref 150–400)
RBC: 3.37 MIL/uL — ABNORMAL LOW (ref 4.22–5.81)
RDW: 14.7 % (ref 11.5–15.5)
WBC: 9.9 10*3/uL (ref 4.0–10.5)
nRBC: 0 % (ref 0.0–0.2)

## 2021-02-10 MED ORDER — APIXABAN 5 MG PO TABS: 5.0000 mg | ORAL_TABLET | Freq: Two times a day (BID) | ORAL | 1 refills | Status: AC

## 2021-02-10 MED ORDER — VANCOMYCIN 50 MG/ML ORAL SOLUTION: 125.0000 mg | Freq: Three times a day (TID) | ORAL | 0 refills | Status: AC

## 2021-02-10 MED ORDER — APIXABAN 5 MG PO TABS: 10.0000 mg | ORAL_TABLET | Freq: Two times a day (BID) | ORAL | 0 refills | Status: AC

## 2021-02-10 NOTE — Telephone Encounter (Signed)
Larry Cordova with [facility] is  Calling and needs a verbal to see if [name]  would agree to be Hospice attending .   Please call Larry Cordova at [phone]

## 2021-02-10 NOTE — Discharge Summary (Signed)
Physician Discharge Summary  [name] JHE:[id number] DOB: [birth year] DOA: 02/03/2021  PCP: [name], MD  Admit date: 02/03/2021 Discharge date: 02/10/2021  Admitted From home Disposition: Home with hospice  Recommendations for Outpatient Follow-up: Discharge home with hospice  Home Health: None Equipment/Devices none Discharge Condition: Hospice discharge CODE STATUS: DO NOT RESUSCITATE  diet recommendation: Regular diet thickened liquids Brief/Interim Summary:Larry Cordova a 87 y.o.malewith medical history significant foradvanced dementia with behavioral disturbance, HTN, hypothyroidism, urinary retention with Foley catheter, COVID-19 diagnosed on 01/18/2021, and recent hospitalization with acute renal failure and staph epidermidis bacteremia, now presenting to the ED for evaluation of Foley catheter problem. The patient was brought in by his wife after he appeared to have pulled his Foley catheter part of the way out. Family noted some blood in the catheter. Patient is unable to contribute to the history due to his dementia. Discussed with wife who noted that patient has been more confused than his baseline, with poor appetite and has been having diarrhea since his discharge from hospital. In the ED, patient is found to be in rigors with temperature 102.7, tachypnea, tachycardia, and hypotension. Chest x-ray demonstrates increased regions of patchy and ill-defined opacity. CBC notable for leukocytosis of 24,200. Lactic acid 2.1. Blood and urine culture were collected. Patient was given 30 cc/kgLR, acetaminophen, vancomycin, cefepime, and Flagyl. Patient admitted for further management.  Discharge Diagnoses:  Principal Problem:   Sepsis ([place]) Active Problems:   Hypothyroidism   HTN (hypertension)   Dementia with behavioral disturbance ([place])   Acute urinary retention   COVID-19 virus infection   Pressure injury of skin   #1severe sepsis-present on admission  secondary to C. difficile colitis and HCAP-chest x-ray/ct chest  showed multifocal bacterial pneumonia/concern for recent Covid infection, UA consistent with UTI though urine culture did not have any growth as of yet.  Blood culture negative.   GI panel negative C. difficile toxin positive. Patient met sepsis criteria on admission with hypotension with a blood pressure of 75/48 tachycardic with a heart rate above 100, febrile with a temp of 103, leukocytosis 24,000 white count, lactic acid 2.1.  He was treated with sepsis protocol, IV fluids vancomycin cefepime . His diarrhea improved significantly on the day of discharge.  He will be discharged on vancomycin 125 mg 3 times a day for 10 days.   #2 [id number] diagnosed 01/18/2021  #3 history of dementia with behaviors/delirium -worse with sepsis.  Continue Depakote  #4 hypothyroidism continue Synthroid  #5 urinary retention   has chronic Foley.  #6 pulmonary embolism CT of the chest showed small age indeterminant PE in the right middle lobe and left upper lobe.  Doppler of the lower extremity showed no evidence of DVT.  He was started on Eliquis.  #7 superficial thrombophlebitis of the left upper extremity no DVT by ultrasound.  #8 multiple pressure ulcers as below present on admission  #9 goals of care discussed with patient's wife and daughter Larry Cordova.    Patient will be discharged home today with hospice.  Discussed with his daughter Larry Cordova.  His prognosis is very poor with all the comorbidities and poor functional status.  He is DNR on admission.  Pressure Injury 02/03/21 Tibial Posterior;Right Stage 2 -  Partial thickness loss of dermis presenting as a shallow open injury with a red, pink wound bed without slough. (Active)  02/03/21 1023  Location: Tibial  Location Orientation: Posterior;Right  Staging: Stage 2 -  Partial thickness loss of dermis presenting as  a shallow open injury with a red, pink wound bed without slough.  Wound  Description (Comments):   Present on Admission: Yes     Pressure Injury 02/03/21 Thigh Left;Lateral Stage 2 -  Partial thickness loss of dermis presenting as a shallow open injury with a red, pink wound bed without slough. (Active)  02/03/21 1026  Location: Thigh  Location Orientation: Left;Lateral  Staging: Stage 2 -  Partial thickness loss of dermis presenting as a shallow open injury with a red, pink wound bed without slough.  Wound Description (Comments):   Present on Admission: Yes     Pressure Injury 02/03/21 Thigh Right;Lateral Unstageable - Full thickness tissue loss in which the base of the injury is covered by slough (yellow, tan, gray, green or brown) and/or eschar (tan, brown or black) in the wound bed. (Active)  02/03/21 1028  Location: Thigh  Location Orientation: Right;Lateral  Staging: Unstageable - Full thickness tissue loss in which the base of the injury is covered by slough (yellow, tan, gray, green or brown) and/or eschar (tan, brown or black) in the wound bed.  Wound Description (Comments):   Present on Admission: Yes      Nutrition Problem: Increased nutrient needs Etiology: acute illness,catabolic illness (ZHGDJ-24 infection)    Signs/Symptoms: estimated needs     Interventions: Ensure Enlive (each supplement provides 350kcal and 20 grams of protein),Prostat,MVI  Estimated body mass index is 22.84 kg/m as calculated from the following:   Height as of this encounter: $RemoveBeforeD'6\' 2"'sqGEejnLFjMEiD$  (1.88 m).   Weight as of this encounter: 80.7 kg.  Discharge Instructions  Discharge Instructions    Diet - low sodium heart healthy   Complete by: As directed    Discharge wound care:   Complete by: As directed    See orders   Increase activity slowly   Complete by: As directed      Allergies as of 02/10/2021   No Known Allergies     Medication List    STOP taking these medications   guaiFENesin-dextromethorphan 100-10 MG/5ML syrup Commonly known as: ROBITUSSIN DM    ibuprofen 200 MG tablet Commonly known as: ADVIL     TAKE these medications   (feeding supplement) PROSource Plus liquid Take 30 mLs by mouth daily.   feeding supplement Liqd Take 237 mLs by mouth 2 (two) times daily between meals.   apixaban 5 MG Tabs tablet Commonly known as: ELIQUIS Take 2 tablets (10 mg total) by mouth 2 (two) times daily for 2 days.   apixaban 5 MG Tabs tablet Commonly known as: ELIQUIS Take 1 tablet (5 mg total) by mouth 2 (two) times daily. Start 3/7 Start taking on: February 12, 2021   divalproex 250 MG 24 hr tablet Commonly known as: DEPAKOTE ER TAKE 2 TABLETS(500 MG) BY MOUTH AT BEDTIME What changed:   how much to take  how to take this  when to take this  additional instructions   levothyroxine 112 MCG tablet Commonly known as: SYNTHROID TAKE 1 TABLET(112 MCG) BY MOUTH DAILY What changed:   how much to take  how to take this  when to take this  additional instructions   tamsulosin 0.4 MG Caps capsule Commonly known as: FLOMAX Take 1 capsule (0.4 mg total) by mouth daily.   traZODone 50 MG tablet Commonly known as: DESYREL Take 1 tablet (50 mg total) by mouth at bedtime.   vancomycin 50 mg/mL  oral solution Commonly known as: VANCOCIN Take 2.5 mLs (125 mg total) by  mouth 3 (three) times daily for 10 days.            Discharge Care Instructions  (From admission, onward)         Start     Ordered   02/10/21 0000  Discharge wound care:       Comments: See orders   02/10/21 1310          No Known Allergies  Consultations:  Palliative care   Procedures/Studies: CT Head Wo Contrast  Result Date: 01/18/2021 CLINICAL DATA:  Head trauma, moderate/severe. Facial trauma. Neck trauma. Additional history provided: Patient presents with weakness/malaise, history of dementia with increased falls recently, 3 falls on Saturday. EXAM: CT HEAD WITHOUT CONTRAST CT MAXILLOFACIAL WITHOUT CONTRAST CT CERVICAL SPINE WITHOUT  CONTRAST TECHNIQUE: Multidetector CT imaging of the head, cervical spine, and maxillofacial structures were performed using the standard protocol without intravenous contrast. Multiplanar CT image reconstructions of the cervical spine and maxillofacial structures were also generated. COMPARISON:  Head CT 12/17/2016. FINDINGS: CT HEAD FINDINGS Brain: Mildly motion degraded exam. Moderate to moderately advanced cerebral atrophy. Comparatively mild cerebellar atrophy. Lateral and third ventriculomegaly, which may be due to from predominant atrophy. Mild patchy and ill-defined hypoattenuation within the cerebral white matter is nonspecific, but compatible with chronic small vessel ischemic disease. There is no acute intracranial hemorrhage. No demarcated cortical infarct. No extra-axial fluid collection. No evidence of intracranial mass. No midline shift. Vascular: No hyperdense vessel. Skull: No hyperdense vessel. Atherosclerotic calcifications. Other: Possible small left occipital scalp hematoma. CT MAXILLOFACIAL FINDINGS Osseous: No acute maxillofacial fracture is identified. Orbits: No acute finding. The globes are normal in size and contour. The extraocular muscles and optic nerve sheath complexes are symmetric and unremarkable. Sinuses: Mild paranasal sinus mucosal thickening, most notably ethmoidal. Soft tissues: No maxillofacial soft tissue swelling appreciable by CT. Other: Poor dentition with multiple absent and carious teeth and multifocal periapical lucencies. CT CERVICAL SPINE FINDINGS Alignment: Straightening of the expected cervical lordosis. Trace fused C4-C5 grade 1 anterolisthesis. 3 mm C7-T1 grade 1 anterolisthesis. Skull base and vertebrae: The basion-dental and atlanto-dental intervals are maintained.No evidence of acute fracture to the cervical spine. Soft tissues and spinal canal: No prevertebral fluid or swelling. No visible canal hematoma. Disc levels: Cervical spondylosis with advanced  multilevel disc space narrowing, disc bulges, posterior disc osteophytes, uncovertebral hypertrophy and facet arthrosis. Fusion across the C4-C5 and C6-C7 disc spaces. There may also be early osseous fusion across the C7-T1 disc space. Multilevel bridging ventral osteophytes. Facet joint ankylosis on the left at C4-C5 and bilaterally at C6-C7. Degenerative changes are also present at the C1-C2 articulation. Upper chest: No consolidation within the imaged lung apices. No visible pneumothorax. IMPRESSION: CT head: 1. Mildly motion degraded exam. 2. No acute posttraumatic intracranial findings. 3. Possible small left occipital scalp hematoma. 4. Moderate to moderately advanced cerebral atrophy, progressed as compared to the head CT of 12/27/2016. 5. Lateral and third ventriculomegaly has also progressed. This may be due to central predominant atrophy. A component of communicating/normal pressure hydrocephalus is difficult to exclude. 6. Stable mild cerebral white matter chronic small vessel ischemic disease. CT maxillofacial: 1. No evidence of acute maxillofacial fracture. 2. Mild paranasal sinus mucosal thickening, most notably ethmoidal. CT cervical spine: 1. No evidence of acute fracture to the cervical spine. 2. 3 mm C7-T1 grade 1 anterolisthesis. 3. Advanced cervical spondylosis as described with multilevel degenerative fusion. Electronically Signed   By: Kellie Simmering DO   On: 01/18/2021 19:05  CT ANGIO CHEST PE W OR WO CONTRAST  Result Date: 02/03/2021 CLINICAL DATA:  Hypotension, abnormal chest radiograph, elevated D-dimer, hypertension, recent COVID-19 at, former smoker, question pulmonary embolism EXAM: CT ANGIOGRAPHY CHEST WITH CONTRAST TECHNIQUE: Multidetector CT imaging of the chest was performed using the standard protocol during bolus administration of intravenous contrast. Multiplanar CT image reconstructions and MIPs were obtained to evaluate the vascular anatomy. Beam hardening artifacts from  patient's arms traverse the chest and scattered respiratory motion artifacts are present, limiting exam. CONTRAST:  20mL OMNIPAQUE IOHEXOL 350 MG/ML SOLN IV COMPARISON:  Chest radiograph 02/03/2021 FINDINGS: Cardiovascular: Atherosclerotic calcifications aorta, proximal great vessels, and coronary arteries. Aneurysmal dilatation ascending thoracic aorta 4.2 cm transverse. No evidence of aortic dissection. Heart unremarkable. No pericardial effusion. Pulmonary arteries adequately opacified. Small filling defects are seen within RIGHT middle lobe and LEFT upper lobe pulmonary arteries consistent with small age-indeterminate pulmonary emboli. Mediastinum/Nodes: Esophagus unremarkable. Base of cervical region normal appearance. No thoracic adenopathy. Lungs/Pleura: Patchy BILATERAL pulmonary infiltrates consistent with multifocal pneumonia. No pleural effusion or pneumothorax. Upper Abdomen: Bowel interposition between liver and diaphragm. Visualized upper abdomen unremarkable. Musculoskeletal: Old appearing anterior height loss of an upper thoracic vertebra approximately T5. No other focal osseous abnormalities. Review of the MIP images confirms the above findings. IMPRESSION: Small age-indeterminate pulmonary emboli in RIGHT middle lobe and LEFT upper lobe pulmonary arteries. Patchy BILATERAL pulmonary infiltrates consistent with multifocal pneumonia. Atherosclerotic calcifications including coronary arteries with aneurysmal dilatation of the ascending thoracic aorta 4.2 cm transverse; recommendation below. Recommend annual imaging followup by CTA or MRA. This recommendation follows 2010 ACCF/AHA/AATS/ACR/ASA/SCA/SCAI/SIR/STS/SVM Guidelines for the Diagnosis and Management of Patients with Thoracic Aortic Disease. Circulation. 2010; 121: R032-L939. Aortic aneurysm NOS (ICD10-I71.9) Emphysema (ICD10-J43.9). Aortic Atherosclerosis (ICD10-I70.0). Aortic aneurysm NOS (ICD10-I71.9). Findings called to Dr. Sharolyn Douglas on  02/03/2021 at 0843 hours. Electronically Signed   By: Ulyses Southward M.D.   On: 02/03/2021 08:43   CT Cervical Spine Wo Contrast  Result Date: 01/18/2021 CLINICAL DATA:  Head trauma, moderate/severe. Facial trauma. Neck trauma. Additional history provided: Patient presents with weakness/malaise, history of dementia with increased falls recently, 3 falls on Saturday. EXAM: CT HEAD WITHOUT CONTRAST CT MAXILLOFACIAL WITHOUT CONTRAST CT CERVICAL SPINE WITHOUT CONTRAST TECHNIQUE: Multidetector CT imaging of the head, cervical spine, and maxillofacial structures were performed using the standard protocol without intravenous contrast. Multiplanar CT image reconstructions of the cervical spine and maxillofacial structures were also generated. COMPARISON:  Head CT 12/17/2016. FINDINGS: CT HEAD FINDINGS Brain: Mildly motion degraded exam. Moderate to moderately advanced cerebral atrophy. Comparatively mild cerebellar atrophy. Lateral and third ventriculomegaly, which may be due to from predominant atrophy. Mild patchy and ill-defined hypoattenuation within the cerebral white matter is nonspecific, but compatible with chronic small vessel ischemic disease. There is no acute intracranial hemorrhage. No demarcated cortical infarct. No extra-axial fluid collection. No evidence of intracranial mass. No midline shift. Vascular: No hyperdense vessel. Skull: No hyperdense vessel. Atherosclerotic calcifications. Other: Possible small left occipital scalp hematoma. CT MAXILLOFACIAL FINDINGS Osseous: No acute maxillofacial fracture is identified. Orbits: No acute finding. The globes are normal in size and contour. The extraocular muscles and optic nerve sheath complexes are symmetric and unremarkable. Sinuses: Mild paranasal sinus mucosal thickening, most notably ethmoidal. Soft tissues: No maxillofacial soft tissue swelling appreciable by CT. Other: Poor dentition with multiple absent and carious teeth and multifocal periapical  lucencies. CT CERVICAL SPINE FINDINGS Alignment: Straightening of the expected cervical lordosis. Trace fused C4-C5 grade 1 anterolisthesis. 3 mm C7-T1 grade 1  anterolisthesis. Skull base and vertebrae: The basion-dental and atlanto-dental intervals are maintained.No evidence of acute fracture to the cervical spine. Soft tissues and spinal canal: No prevertebral fluid or swelling. No visible canal hematoma. Disc levels: Cervical spondylosis with advanced multilevel disc space narrowing, disc bulges, posterior disc osteophytes, uncovertebral hypertrophy and facet arthrosis. Fusion across the C4-C5 and C6-C7 disc spaces. There may also be early osseous fusion across the C7-T1 disc space. Multilevel bridging ventral osteophytes. Facet joint ankylosis on the left at C4-C5 and bilaterally at C6-C7. Degenerative changes are also present at the C1-C2 articulation. Upper chest: No consolidation within the imaged lung apices. No visible pneumothorax. IMPRESSION: CT head: 1. Mildly motion degraded exam. 2. No acute posttraumatic intracranial findings. 3. Possible small left occipital scalp hematoma. 4. Moderate to moderately advanced cerebral atrophy, progressed as compared to the head CT of 12/27/2016. 5. Lateral and third ventriculomegaly has also progressed. This may be due to central predominant atrophy. A component of communicating/normal pressure hydrocephalus is difficult to exclude. 6. Stable mild cerebral white matter chronic small vessel ischemic disease. CT maxillofacial: 1. No evidence of acute maxillofacial fracture. 2. Mild paranasal sinus mucosal thickening, most notably ethmoidal. CT cervical spine: 1. No evidence of acute fracture to the cervical spine. 2. 3 mm C7-T1 grade 1 anterolisthesis. 3. Advanced cervical spondylosis as described with multilevel degenerative fusion. Electronically Signed   By: Kellie Simmering DO   On: 01/18/2021 19:05   US RENAL  Result Date: 01/18/2021 CLINICAL DATA:  Renal failure  EXAM: RENAL / URINARY TRACT ULTRASOUND COMPLETE COMPARISON:  None. FINDINGS: Right Kidney: Renal measurements: 11.7 x 5.7 x 4.4 cm = volume: 153 mL. 1.2 cm cyst in the midpole. Normal echotexture. No hydronephrosis. Left Kidney: Renal measurements: 11.2 x 5.7 x 4.6 cm = volume: 152 mL. 1.4 cm cyst in the lower pole. Normal echotexture. No hydronephrosis. Bladder: Decompressed with Foley catheter in place. Other: None. IMPRESSION: No acute findings.  No hydronephrosis. Electronically Signed   By: Rolm Baptise M.D.   On: 01/18/2021 21:39   DG Chest Port 1 View  Result Date: 02/03/2021 CLINICAL DATA:  Possible sepsis EXAM: PORTABLE CHEST 1 VIEW COMPARISON:  01/18/2021 FINDINGS: Increasing regions of patchy and ill-defined opacity seen in the mid lungs and left lung base. No pneumothorax. No visible pleural effusion. Stable cardiomediastinal contours accounting for differences in technique and inflation with a calcified, tortuous aorta. No acute osseous or soft tissue abnormality. Degenerative changes are present in the imaged spine and shoulders. Telemetry leads overlie the chest. IMPRESSION: Increasing regions of patchy and ill-defined opacity in the mid lungs and left lung base, could reflect developing airspace disease and/or atelectasis appropriate clinical setting Electronically Signed   By: Lovena Le M.D.   On: 02/03/2021 02:16   DG Chest Portable 1 View  Result Date: 01/18/2021 CLINICAL DATA:  Weakness, malaise for 1 week, dementia EXAM: PORTABLE CHEST 1 VIEW COMPARISON:  12/07/2016 FINDINGS: Single frontal view of the chest demonstrates an unremarkable cardiac silhouette. Lung volumes are diminished, with chronic scarring at the lung bases. Patchy consolidation at the left lung base likely represents atelectasis. No effusion or pneumothorax. No acute bony abnormalities. IMPRESSION: 1. Low lung volumes.  No acute airspace disease. Electronically Signed   By: Randa Ngo M.D.   On: 01/18/2021 17:34    ECHOCARDIOGRAM COMPLETE  Result Date: 01/20/2021    ECHOCARDIOGRAM REPORT   Patient Name:   Bradrick R Sara Date of Exam: 01/20/2021 Medical Rec #:  [id number]  Height:       74.0 in Accession #:    [id number]      Weight:       170.4 lb Date of Birth:  1930/04/21       BSA:          2.031 m Patient Age:    69 years        BP:           135/82 mmHg Patient Gender: M               HR:           77 bpm. Exam Location:  Inpatient Procedure: 2D Echo, Cardiac Doppler and Color Doppler Indications:    Bacteremia R78.81  History:        Patient has no prior history of Echocardiogram examinations.                 Risk Factors:Hypertension. COVID-19 Positive. Thyroidism.  Sonographer:    Tiffany Dance Referring Phys: [id number] [facility]  Sonographer Comments: Suboptimal subcostal window. Image acquisition challenging due to respiratory motion. IMPRESSIONS  1. Left ventricular ejection fraction, by estimation, is 65 to 70%. The left ventricle has normal function. The left ventricle has no regional wall motion abnormalities. There is mild left ventricular hypertrophy. Left ventricular diastolic parameters are consistent with Grade I diastolic dysfunction (impaired relaxation).  2. Right ventricular systolic function is normal. The right ventricular size is normal.  3. Left atrial size was mildly dilated.  4. The mitral valve is abnormal. Trivial mitral valve regurgitation. Moderate mitral annular calcification.  5. The aortic valve is tricuspid. Aortic valve regurgitation is trivial. Mild aortic valve sclerosis is present, with no evidence of aortic valve stenosis.  6. Aortic dilatation noted. There is moderate dilatation of the aortic root, measuring 44 mm. There is mild dilatation of the ascending aorta, measuring 39 mm. Conclusion(s)/Recommendation(s): No evidence of valvular vegetations on this transthoracic echocardiogram. Would recommend a transesophageal echocardiogram to exclude infective endocarditis if  clinically indicated. FINDINGS  Left Ventricle: Left ventricular ejection fraction, by estimation, is 65 to 70%. The left ventricle has normal function. The left ventricle has no regional wall motion abnormalities. The left ventricular internal cavity size was small. There is mild left ventricular hypertrophy. Left ventricular diastolic parameters are consistent with Grade I diastolic dysfunction (impaired relaxation). Indeterminate filling pressures. Right Ventricle: The right ventricular size is normal. No increase in right ventricular wall thickness. Right ventricular systolic function is normal. Left Atrium: Left atrial size was mildly dilated. Right Atrium: Right atrial size was normal in size. Pericardium: There is no evidence of pericardial effusion. Mitral Valve: The mitral valve is abnormal. Moderate mitral annular calcification. Trivial mitral valve regurgitation. Tricuspid Valve: The tricuspid valve is grossly normal. Tricuspid valve regurgitation is trivial. Aortic Valve: The aortic valve is tricuspid. There is moderate aortic valve annular calcification. Aortic valve regurgitation is trivial. Aortic regurgitation PHT measures 405 msec. Mild aortic valve sclerosis is present, with no evidence of aortic valve  stenosis. Pulmonic Valve: The pulmonic valve was grossly normal. Pulmonic valve regurgitation is trivial. Aorta: Aortic dilatation noted. There is moderate dilatation of the aortic root, measuring 44 mm. There is mild dilatation of the ascending aorta, measuring 39 mm. IAS/Shunts: No atrial level shunt detected by color flow Doppler.  LEFT VENTRICLE PLAX 2D LVIDd:         3.52 cm  Diastology LVIDs:         2.63 cm  LV e' medial:    5.22 cm/s LV PW:         1.07 cm  LV E/e' medial:  15.4 LV IVS:        1.08 cm  LV e' lateral:   11.70 cm/s LVOT diam:     1.80 cm  LV E/e' lateral: 6.9 LV SV:         72 LV SV Index:   35 LVOT Area:     2.54 cm  RIGHT VENTRICLE RV Basal diam:  2.55 cm RV S prime:      11.10 cm/s TAPSE (M-mode): 1.5 cm LEFT ATRIUM         Index LA diam:    3.60 cm 1.77 cm/m  AORTIC VALVE LVOT Vmax:   149.00 cm/s LVOT Vmean:  107.000 cm/s LVOT VTI:    0.282 m AI PHT:      405 msec  AORTA Ao Root diam: 4.40 cm Ao Asc diam:  3.90 cm MITRAL VALVE MV Area (PHT): 2.17 cm     SHUNTS MV Decel Time: 349 msec     Systemic VTI:  0.28 m MV E velocity: 80.20 cm/s   Systemic Diam: 1.80 cm MV A velocity: 109.00 cm/s MV E/A ratio:  0.74 Lyman Bishop MD Electronically signed by Lyman Bishop MD Signature Date/Time: 01/20/2021/3:27:41 PM    Final    VAS [place] LOWER EXTREMITY VENOUS (DVT)  Result Date: 02/04/2021  Lower Venous DVT Study Indications: Pulmonary embolism.  Risk Factors: COVID 19 positive. Limitations: Poor ultrasound/tissue interface and patient movement, patient positioning, poor patient cooperation. Comparison Study: No prior studies. Performing Technologist: Oliver Hum RVT  Examination Guidelines: A complete evaluation includes B-mode imaging, spectral Doppler, color Doppler, and power Doppler as needed of all accessible portions of each vessel. Bilateral testing is considered an integral part of a complete examination. Limited examinations for reoccurring indications may be performed as noted. The reflux portion of the exam is performed with the patient in reverse Trendelenburg.  +---------+---------------+---------+-----------+----------+--------------+ RIGHT    CompressibilityPhasicitySpontaneityPropertiesThrombus Aging +---------+---------------+---------+-----------+----------+--------------+ CFV      Full           Yes      Yes                                 +---------+---------------+---------+-----------+----------+--------------+ SFJ      Full                                                        +---------+---------------+---------+-----------+----------+--------------+ FV Prox  Full                                                         +---------+---------------+---------+-----------+----------+--------------+ FV Mid   Full                                                        +---------+---------------+---------+-----------+----------+--------------+ FV DistalFull                                                        +---------+---------------+---------+-----------+----------+--------------+  PFV      Full                                                        +---------+---------------+---------+-----------+----------+--------------+ POP      Full           Yes      Yes                                 +---------+---------------+---------+-----------+----------+--------------+ PTV      Full                                                        +---------+---------------+---------+-----------+----------+--------------+ PERO     Full                                                        +---------+---------------+---------+-----------+----------+--------------+   +---------+---------------+---------+-----------+----------+--------------+ LEFT     CompressibilityPhasicitySpontaneityPropertiesThrombus Aging +---------+---------------+---------+-----------+----------+--------------+ CFV      Full           Yes      Yes                                 +---------+---------------+---------+-----------+----------+--------------+ SFJ      Full                                                        +---------+---------------+---------+-----------+----------+--------------+ FV Prox  Full                                                        +---------+---------------+---------+-----------+----------+--------------+ FV Mid   Full                                                        +---------+---------------+---------+-----------+----------+--------------+ FV DistalFull                                                         +---------+---------------+---------+-----------+----------+--------------+ PFV      Full                                                        +---------+---------------+---------+-----------+----------+--------------+   POP      Full           Yes      Yes                                 +---------+---------------+---------+-----------+----------+--------------+ PTV      Full                                                        +---------+---------------+---------+-----------+----------+--------------+ PERO     Full                                                        +---------+---------------+---------+-----------+----------+--------------+     Summary: RIGHT: - There is no evidence of deep vein thrombosis in the lower extremity. However, portions of this examination were limited- see technologist comments above.  - No cystic structure found in the popliteal fossa.  LEFT: - There is no evidence of deep vein thrombosis in the lower extremity. However, portions of this examination were limited- see technologist comments above.  - No cystic structure found in the popliteal fossa.  *See table(s) above for measurements and observations. Electronically signed by Ruta Hinds MD on 02/04/2021 at 10:42:22 AM.    Final    VAS [place] UPPER EXTREMITY VENOUS DUPLEX  Result Date: 02/07/2021 UPPER VENOUS STUDY  Indications: Edema Risk Factors: COVID 19 positive. Limitations: Bandages, line and patient positioning, patient movement. Comparison Study: No prior studies. Performing Technologist: Oliver Hum RVT  Examination Guidelines: A complete evaluation includes B-mode imaging, spectral Doppler, color Doppler, and power Doppler as needed of all accessible portions of each vessel. Bilateral testing is considered an integral part of a complete examination. Limited examinations for reoccurring indications may be performed as noted.  Right Findings:  +----------+------------+---------+-----------+----------+-------+ RIGHT     CompressiblePhasicitySpontaneousPropertiesSummary +----------+------------+---------+-----------+----------+-------+ Subclavian    Full       Yes       Yes                      +----------+------------+---------+-----------+----------+-------+  Left Findings: +----------+------------+---------+-----------+----------+-------+ LEFT      CompressiblePhasicitySpontaneousPropertiesSummary +----------+------------+---------+-----------+----------+-------+ IJV           Full       Yes       Yes                      +----------+------------+---------+-----------+----------+-------+ Subclavian    Full       Yes       Yes                      +----------+------------+---------+-----------+----------+-------+ Axillary      Full       Yes       Yes                      +----------+------------+---------+-----------+----------+-------+ Brachial      Full       Yes       Yes                      +----------+------------+---------+-----------+----------+-------+  Radial        Full                                          +----------+------------+---------+-----------+----------+-------+ Ulnar         Full                                          +----------+------------+---------+-----------+----------+-------+ Cephalic      None                                   Acute  +----------+------------+---------+-----------+----------+-------+ Basilic       Full                                          +----------+------------+---------+-----------+----------+-------+  Summary:  Right: No evidence of thrombosis in the subclavian.  Left: No evidence of deep vein thrombosis in the upper extremity. Findings consistent with acute superficial vein thrombosis involving the left cephalic vein.  *See table(s) above for measurements and observations.  Diagnosing physician: Curt Jews MD  Electronically signed by Curt Jews MD on 02/07/2021 at 7:20:39 PM.    Final    CT Maxillofacial Wo Contrast  Result Date: 01/18/2021 CLINICAL DATA:  Head trauma, moderate/severe. Facial trauma. Neck trauma. Additional history provided: Patient presents with weakness/malaise, history of dementia with increased falls recently, 3 falls on Saturday. EXAM: CT HEAD WITHOUT CONTRAST CT MAXILLOFACIAL WITHOUT CONTRAST CT CERVICAL SPINE WITHOUT CONTRAST TECHNIQUE: Multidetector CT imaging of the head, cervical spine, and maxillofacial structures were performed using the standard protocol without intravenous contrast. Multiplanar CT image reconstructions of the cervical spine and maxillofacial structures were also generated. COMPARISON:  Head CT 12/17/2016. FINDINGS: CT HEAD FINDINGS Brain: Mildly motion degraded exam. Moderate to moderately advanced cerebral atrophy. Comparatively mild cerebellar atrophy. Lateral and third ventriculomegaly, which may be due to from predominant atrophy. Mild patchy and ill-defined hypoattenuation within the cerebral white matter is nonspecific, but compatible with chronic small vessel ischemic disease. There is no acute intracranial hemorrhage. No demarcated cortical infarct. No extra-axial fluid collection. No evidence of intracranial mass. No midline shift. Vascular: No hyperdense vessel. Skull: No hyperdense vessel. Atherosclerotic calcifications. Other: Possible small left occipital scalp hematoma. CT MAXILLOFACIAL FINDINGS Osseous: No acute maxillofacial fracture is identified. Orbits: No acute finding. The globes are normal in size and contour. The extraocular muscles and optic nerve sheath complexes are symmetric and unremarkable. Sinuses: Mild paranasal sinus mucosal thickening, most notably ethmoidal. Soft tissues: No maxillofacial soft tissue swelling appreciable by CT. Other: Poor dentition with multiple absent and carious teeth and multifocal periapical lucencies. CT CERVICAL  SPINE FINDINGS Alignment: Straightening of the expected cervical lordosis. Trace fused C4-C5 grade 1 anterolisthesis. 3 mm C7-T1 grade 1 anterolisthesis. Skull base and vertebrae: The basion-dental and atlanto-dental intervals are maintained.No evidence of acute fracture to the cervical spine. Soft tissues and spinal canal: No prevertebral fluid or swelling. No visible canal hematoma. Disc levels: Cervical spondylosis with advanced multilevel disc space narrowing, disc bulges, posterior disc osteophytes, uncovertebral hypertrophy and facet arthrosis. Fusion across the C4-C5 and C6-C7 disc spaces. There may also be  early osseous fusion across the C7-T1 disc space. Multilevel bridging ventral osteophytes. Facet joint ankylosis on the left at C4-C5 and bilaterally at C6-C7. Degenerative changes are also present at the C1-C2 articulation. Upper chest: No consolidation within the imaged lung apices. No visible pneumothorax. IMPRESSION: CT head: 1. Mildly motion degraded exam. 2. No acute posttraumatic intracranial findings. 3. Possible small left occipital scalp hematoma. 4. Moderate to moderately advanced cerebral atrophy, progressed as compared to the head CT of 12/27/2016. 5. Lateral and third ventriculomegaly has also progressed. This may be due to central predominant atrophy. A component of communicating/normal pressure hydrocephalus is difficult to exclude. 6. Stable mild cerebral white matter chronic small vessel ischemic disease. CT maxillofacial: 1. No evidence of acute maxillofacial fracture. 2. Mild paranasal sinus mucosal thickening, most notably ethmoidal. CT cervical spine: 1. No evidence of acute fracture to the cervical spine. 2. 3 mm C7-T1 grade 1 anterolisthesis. 3. Advanced cervical spondylosis as described with multilevel degenerative fusion. Electronically Signed   By: Kellie Simmering DO   On: 01/18/2021 19:05    (Echo, Carotid, EGD, Colonoscopy, ERCP)    Subjective:  He is resting in bed awake  very hard of hearing in no acute distress trying to keep his smile on his face Foley in place rectal tube in place with empty rectal bag Discharge Exam: Vitals:   02/09/21 2209 02/10/21 0433  BP: 138/78 (!) 149/75  [place]: 78 69  Resp:  20  Temp: 98 F (36.7 C) 98.5 F (36.9 C)  SpO2: 97% 94%   Vitals:   02/09/21 2156 02/09/21 2209 02/10/21 0433 02/10/21 0600  BP: (!) 141/69 138/78 (!) 149/75   [place]: 68 78 69   Resp: 18  20   Temp: 98 F (36.7 C) 98 F (36.7 C) 98.5 F (36.9 C)   TempSrc: Oral Axillary    SpO2: 93% 97% 94%   Weight:    80.7 kg  Height:        General: Pt is alert, awake, not in acute distress Cardiovascular: RRR, S1/S2 +, no rubs, no gallops Respiratory: CTA bilaterally, no wheezing, no rhonchi Abdominal: Soft, NT, ND, bowel sounds + Extremities: Left upper extremity with 2+ edema   The results of significant diagnostics from this hospitalization (including imaging, microbiology, ancillary and laboratory) are listed below for reference.     Microbiology: Recent Results (from the past 240 hour(s))  Blood culture (routine single)     Status: None   Collection Time: 02/03/21  1:34 AM   Specimen: BLOOD  Result Value Ref Range Status   Specimen Description   Final    BLOOD LEFT ANTECUBITAL Performed at [facility] [address] [place], [place] [address]    Special Requests   Final    BOTTLES DRAWN AEROBIC AND ANAEROBIC Blood Culture adequate volume Performed at [facility] [address], [place], [place] [address]    Culture   Final    NO GROWTH 5 DAYS Performed at [facility], [place] [address] [place], [place] [address]    Report Status 02/08/2021 FINAL  Final  Urine culture     Status: None   Collection Time: 02/03/21  3:00 AM   Specimen: In/Out Cath Urine  Result Value Ref Range Status   Specimen Description   Final    IN/OUT CATH URINE Performed at [facility]  [address] [place],  [address]    Special Requests   Final  NONE Performed at [facility], [place] [address] [place], [place] [address]    Culture   Final    NO GROWTH Performed at [facility], [place] [address], [place], [place] [address]    Report Status 02/05/2021 FINAL  Final  MRSA PCR Screening     Status: None   Collection Time: 02/03/21  8:51 AM   Specimen: Nasal Mucosa; Nasopharyngeal  Result Value Ref Range Status   MRSA by PCR NEGATIVE NEGATIVE Final    Comment:        The GeneXpert MRSA Assay (FDA approved for NASAL specimens only), is one component of a comprehensive MRSA colonization surveillance program. It is not intended to diagnose MRSA infection nor to guide or monitor treatment for MRSA infections. Performed at [facility], [place] [address], [place], [place] [address]   Culture, blood (single)     Status: None   Collection Time: 02/03/21  9:41 AM   Specimen: BLOOD  Result Value Ref Range Status   Specimen Description   Final    BLOOD RIGHT ANTECUBITAL Performed at [facility] [address], [place], [place] [address]    Special Requests   Final    BOTTLES DRAWN AEROBIC ONLY Blood Culture adequate volume Performed at [facility] [address] [place], [place] [address]    Culture   Final    NO GROWTH 5 DAYS Performed at [facility], [place] [address] [place], [place] [address]    Report Status 02/08/2021 FINAL  Final  Culture, blood (x 2)     Status: None   Collection Time: 02/03/21  9:41 AM   Specimen: BLOOD  Result Value Ref Range Status   Specimen Description   Final    BLOOD LEFT ANTECUBITAL Performed at [facility] [address] [place], [place] [address]    Special Requests   Final    BOTTLES DRAWN AEROBIC ONLY LEFT ANTECUBITAL Performed at [facility] [address], [place], [place] [address]     Culture   Final    NO GROWTH 5 DAYS Performed at [facility], [place] [address] [place],  [address]    Report Status 02/08/2021 FINAL  Final  C Difficile Quick Screen w PCR reflex     Status: Abnormal   Collection Time: 02/03/21  3:20 PM   Specimen: STOOL  Result Value Ref Range Status   C Diff antigen POSITIVE (A) NEGATIVE Final   C Diff toxin POSITIVE (A) NEGATIVE Final   C Diff interpretation Toxin producing C. difficile detected.  Final    Comment: RBV TO JANEE Performed at [facility], [place] [address], [place],  [address]   Gastrointestinal Panel by PCR , Stool     Status: None   Collection Time: 02/03/21  3:20 PM   Specimen: STOOL  Result Value Ref Range Status   Campylobacter species NOT DETECTED NOT DETECTED Final   Plesimonas shigelloides NOT DETECTED NOT DETECTED Final   Salmonella species NOT DETECTED NOT DETECTED Final   Yersinia enterocolitica NOT DETECTED NOT DETECTED Final   Vibrio species NOT DETECTED NOT DETECTED Final   Vibrio cholerae NOT DETECTED NOT DETECTED Final   Enteroaggregative E coli (EAEC) NOT DETECTED NOT DETECTED Final   Enteropathogenic E coli (EPEC) NOT DETECTED NOT DETECTED Final   Enterotoxigenic E coli (ETEC) NOT DETECTED NOT DETECTED Final   Shiga like toxin producing E  coli (STEC) NOT DETECTED NOT DETECTED Final   Shigella/Enteroinvasive E coli (EIEC) NOT DETECTED NOT DETECTED Final   Cryptosporidium NOT DETECTED NOT DETECTED Final   Cyclospora cayetanensis NOT DETECTED NOT DETECTED Final   Entamoeba histolytica NOT DETECTED NOT DETECTED Final   Giardia lamblia NOT DETECTED NOT DETECTED Final   Adenovirus F40/41 NOT DETECTED NOT DETECTED Final   Astrovirus NOT DETECTED NOT DETECTED Final   Norovirus GI/GII NOT DETECTED NOT DETECTED Final   Rotavirus A NOT DETECTED NOT DETECTED Final   Sapovirus (I, II, IV, and V) NOT DETECTED NOT DETECTED Final    Comment: Performed at [facility], [place] [place], [place] [address]     Labs: BNP (last 3 results) Recent Labs    02/03/21 0527  BNP 294.7*   Basic Metabolic Panel: Recent Labs  Lab 02/04/21 0539 02/05/21 0556 02/05/21 0610 02/06/21 0311 02/07/21 0420 02/08/21 0412  NA 139  --  137 141 141 138  K 3.6  --  2.4* 3.8 3.5 3.3*  CL 108  --  113* 108 109 106  CO2 23  --  $R'22 24 27 25  'Yq$ GLUCOSE 88  --  119* 132* 106* 101*  BUN 13  --  $R'9 9 8 'ZX$ 7*  CREATININE 0.69  --  0.75 0.55* 0.64 0.66  CALCIUM 7.3*  --  6.7* 7.9* 7.7* 7.8*  MG  --  1.4*  --  2.3  --   --    Liver Function Tests: No results for input(s): AST, ALT, ALKPHOS, BILITOT, PROT, ALBUMIN in the last 168 hours. No results for input(s): LIPASE, AMYLASE in the last 168 hours. No results for input(s): AMMONIA in the last 168 hours. CBC: Recent Labs  Lab 02/06/21 0311 02/07/21 0420 02/08/21 0412 02/09/21 0422 02/10/21 0354  WBC 12.0* 10.6* 12.3* 13.1* 9.9  NEUTROABS 9.3* 6.9 8.4* 8.9* 5.9  HGB 10.5* 10.2* 10.5* 9.8* 10.3*  HCT 31.5* 31.4* 32.4* 30.5* 32.6*  MCV 93.2 93.7 93.6 95.3 96.7  PLT 288 265 303 305 302   Cardiac Enzymes: No results for input(s): CKTOTAL, CKMB, CKMBINDEX, TROPONINI in the last 168 hours. BNP: Invalid input(s): POCBNP CBG: No results for input(s): GLUCAP in the last 168 hours. D-Dimer No results for input(s): DDIMER in the last 72 hours. Hgb A1c No results for input(s): HGBA1C in the last 72 hours. Lipid Profile No results for input(s): CHOL, HDL, LDLCALC, TRIG, CHOLHDL, LDLDIRECT in the last 72 hours. Thyroid function studies No results for input(s): TSH, T4TOTAL, T3FREE, THYROIDAB in the last 72 hours.  Invalid input(s): FREET3 Anemia work up No results for input(s): VITAMINB12, FOLATE, FERRITIN, TIBC, IRON, RETICCTPCT in the last 72 hours. Urinalysis    Component Value Date/Time   COLORURINE AMBER (A) 02/03/2021 0300   APPEARANCEUR CLOUDY (A) 02/03/2021 0300   LABSPEC 1.024 02/03/2021 0300   PHURINE 5.0  02/03/2021 0300   GLUCOSEU NEGATIVE 02/03/2021 0300   HGBUR LARGE (A) 02/03/2021 0300   BILIRUBINUR NEGATIVE 02/03/2021 0300   BILIRUBINUR negative 12/07/2016 1850   BILIRUBINUR neg 07/29/2014 0906   KETONESUR 5 (A) 02/03/2021 0300   PROTEINUR 100 (A) 02/03/2021 0300   UROBILINOGEN 0.2 12/07/2016 1850   NITRITE NEGATIVE 02/03/2021 0300   LEUKOCYTESUR TRACE (A) 02/03/2021 0300   Sepsis Labs Invalid input(s): PROCALCITONIN,  WBC,  LACTICIDVEN Microbiology Recent Results (from the past 240 hour(s))  Blood culture (routine single)     Status: None   Collection Time: 02/03/21  1:34 AM  Specimen: BLOOD  Result Value Ref Range Status   Specimen Description   Final    BLOOD LEFT ANTECUBITAL Performed at [facility] [address], [place], [place] [address]    Special Requests   Final    BOTTLES DRAWN AEROBIC AND ANAEROBIC Blood Culture adequate volume Performed at [facility] [address], [place], [place] [address]    Culture   Final    NO GROWTH 5 DAYS Performed at [facility], [place] [address] [place], [place] [address]    Report Status 02/08/2021 FINAL  Final  Urine culture     Status: None   Collection Time: 02/03/21  3:00 AM   Specimen: In/Out Cath Urine  Result Value Ref Range Status   Specimen Description   Final    IN/OUT CATH URINE Performed at [facility] [address], [place], [place] [address]    Special Requests   Final    NONE Performed at [facility], [place] [address] [place], [place] [address]    Culture   Final    NO GROWTH Performed at [facility], [place] [address], [place], [place] [address]    Report Status 02/05/2021 FINAL  Final  MRSA PCR Screening     Status: None   Collection Time: 02/03/21  8:51 AM   Specimen: Nasal Mucosa; Nasopharyngeal  Result Value Ref Range Status   MRSA by PCR NEGATIVE NEGATIVE Final    Comment:        The GeneXpert MRSA  Assay (FDA approved for NASAL specimens only), is one component of a comprehensive MRSA colonization surveillance program. It is not intended to diagnose MRSA infection nor to guide or monitor treatment for MRSA infections. Performed at [facility], [place] [address], [place], [place] [address]   Culture, blood (single)     Status: None   Collection Time: 02/03/21  9:41 AM   Specimen: BLOOD  Result Value Ref Range Status   Specimen Description   Final    BLOOD RIGHT ANTECUBITAL Performed at [facility] [address], [place], [place] [address]    Special Requests   Final    BOTTLES DRAWN AEROBIC ONLY Blood Culture adequate volume Performed at [facility] [address] [place], [place] [address]    Culture   Final    NO GROWTH 5 DAYS Performed at [facility], [place] [address], [place], [place] [address]    Report Status 02/08/2021 FINAL  Final  Culture, blood (x 2)     Status: None   Collection Time: 02/03/21  9:41 AM   Specimen: BLOOD  Result Value Ref Range Status   Specimen Description   Final    BLOOD LEFT ANTECUBITAL Performed at [facility] [address], [place], [place] [address]    Special Requests   Final    BOTTLES DRAWN AEROBIC ONLY LEFT ANTECUBITAL Performed at [facility] [address], [place], [place] [address]    Culture   Final    NO GROWTH 5 DAYS Performed at [facility], [place] [address], [place],  [address]    Report Status 02/08/2021 FINAL  Final  C Difficile Quick Screen w PCR reflex     Status: Abnormal   Collection Time: 02/03/21  3:20 PM   Specimen: STOOL  Result Value Ref Range Status   C Diff antigen POSITIVE (A)  NEGATIVE Final   C Diff toxin POSITIVE (A) NEGATIVE Final   C Diff interpretation Toxin producing C. difficile detected.  Final    Comment: RBV TO JANEE Performed at [facility], [place]  [address], [place], [place] [address]   Gastrointestinal Panel by PCR , Stool     Status: None   Collection Time: 02/03/21  3:20 PM   Specimen: STOOL  Result Value Ref Range Status   Campylobacter species NOT DETECTED NOT DETECTED Final   Plesimonas shigelloides NOT DETECTED NOT DETECTED Final   Salmonella species NOT DETECTED NOT DETECTED Final   Yersinia enterocolitica NOT DETECTED NOT DETECTED Final   Vibrio species NOT DETECTED NOT DETECTED Final   Vibrio cholerae NOT DETECTED NOT DETECTED Final   Enteroaggregative E coli (EAEC) NOT DETECTED NOT DETECTED Final   Enteropathogenic E coli (EPEC) NOT DETECTED NOT DETECTED Final   Enterotoxigenic E coli (ETEC) NOT DETECTED NOT DETECTED Final   Shiga like toxin producing E coli (STEC) NOT DETECTED NOT DETECTED Final   Shigella/Enteroinvasive E coli (EIEC) NOT DETECTED NOT DETECTED Final   Cryptosporidium NOT DETECTED NOT DETECTED Final   Cyclospora cayetanensis NOT DETECTED NOT DETECTED Final   Entamoeba histolytica NOT DETECTED NOT DETECTED Final   Giardia lamblia NOT DETECTED NOT DETECTED Final   Adenovirus F40/41 NOT DETECTED NOT DETECTED Final   Astrovirus NOT DETECTED NOT DETECTED Final   Norovirus GI/GII NOT DETECTED NOT DETECTED Final   Rotavirus A NOT DETECTED NOT DETECTED Final   Sapovirus (I, II, IV, and V) NOT DETECTED NOT DETECTED Final    Comment: Performed at [facility], [address] [place],  [address]     Time coordinating discharge:  39 minutes  SIGNED:   Georgette Shell, MD  [facility] 02/10/2021, 1:15 PM

## 2021-02-10 NOTE — Progress Notes (Signed)
EPIC Template:  New Hospice at Home Referral Note  [facility])  Received request from [facility] for hospice services at home after discharge.  Chart and pt information under review by [facility] physician.  Hospice eligibility pending at this time.  Patient is set up with an assessment visit by [facility] RN tomorrow at [place] send signed and completed DNR home with pt/family.  Please provide prescriptions at discharge as needed to ensure ongoing symptom management.    DME needs discussed.  Per TOC Cookie all DME has been ordered.  ACC information and contact numbers given to daughter [name] by [facility] referral specialist.  Above information shared with Cookie, [place].  Please call with any questions or concerns.  Thank you for the opportunity to participate in this pt's care.  Domenic Moras, BSN, RN [employer] [phone] (24h on call)

## 2021-02-10 NOTE — TOC Progression Note (Signed)
Transition of Care [facility]) - Progression Note    Patient Details  Name: Larry Cordova MRN: [id number] Date of Birth: Aug 09, 1930  Transition of Care [facility]) CM/SW Contact  Purcell Mouton, RN Phone Number: 02/10/2021, 2:41 PM  Clinical Narrative:    Corey Harold was called to transport pt home. Pt's wife was called and RN is aware that transportation was called.   Expected Discharge Plan: [place] Barriers to Discharge: No Barriers Identified  Expected Discharge Plan and Services Expected Discharge Plan: [place]   Discharge Planning Services: CM Consult   Living arrangements for the past 2 months: Single Family Home Expected Discharge Date: 02/10/21                                     Social Determinants of Health (SDOH) Interventions    Readmission Risk Interventions No flowsheet data found.

## 2021-02-10 NOTE — Progress Notes (Signed)
Nutrition Follow-up  DOCUMENTATION CODES:   Not applicable  INTERVENTION:  - continue Ensure Enlive PRN and 30 ml Prosource Plus BID.  NUTRITION DIAGNOSIS:   Increased nutrient needs related to acute illness,catabolic illness (TRRNH-65 infection) as evidenced by estimated needs. -ongoing  GOAL:   Patient will meet greater than or equal to 90% of their needs -unmet on average  MONITOR:   PO intake,Supplement acceptance,Labs,Weight trends  ASSESSMENT:   87 y.o. male with medical history of advanced dementia with behavioral disturbance, HTN, hypothyroidism, urinary retention with Foley catheter, COVID-19 diagnosed on 01/18/2021. He was recently hospitalized due to ARF and staph epidermis bacteremia. He presented to the ED after pulling Foley part way out at home. Wife reported that patient has had a poor appetite and having diarrhea since discharge.  Patient has been eating 40-100% over the past 4 days. Ensure changed from scheduled to PRN yesterday. He has been accepting Prosource Plus 100% of the time offered.   Weight today is +8 lb compared to admission (2/25) weight. Mild pitting edema to BLE documented in the edema section of flow sheet.   He is noted to be a/o to self only. RN note from this afternoon states that patient is able to take PO meds crushed in apple sauce and take sips of thickened liquids.  Palliative Care is following and patient to d/c home with hospice at time of d/c.   SLP last saw patient yesterday and recommendation was for current diet order: dysphagia 1, nectar-thick liquids.   Labs reviewed; K: 3.3 mmol/l, BUN: 7 mg/dl, Ca: 7.8 mg/dl. Medications reviewed; 112 mcg oral synthroid/day.    Diet Order:   Diet Order            DIET - DYS 1 Room service appropriate? Yes; Fluid consistency: Nectar Thick  Diet effective now                 EDUCATION NEEDS:   Not appropriate for education at this time  Skin:  Skin Assessment: Skin Integrity  Issues: Skin Integrity Issues:: Stage II,Unstageable Stage II: R tibial area; L thigh Unstageable: full thickness to R thigh  Last BM:  3/4 (300 ml via rectal tube)  Height:   Ht Readings from Last 1 Encounters:  02/03/21 6\' 2"  (1.88 m)    Weight:   Wt Readings from Last 1 Encounters:  02/10/21 80.7 kg     Estimated Nutritional Needs:  Kcal:  1935-2165 kcal Protein:  100-110 grams Fluid:  >/= 2.5 L/day     Jarome Matin, MS, RD, LDN, CNSC Inpatient Clinical Dietitian RD pager # available in [facility]  After hours/weekend pager # available in [facility]

## 2021-02-10 NOTE — Progress Notes (Signed)
Called and spoke with wife, and explained that the case manager would be setting up transport for patient shortly. Wife endorses she is ready for the patient.

## 2021-02-10 NOTE — Progress Notes (Addendum)
Shift Summary:   More alert during the earlier part of the shift, otherwise rested during the ladder part of the shift. Chronic foley remains clean, dry, intact. Only ate about 10-25% of all meals. Discharge packet stored at the nurses station, attempted to call daughter to explain discharge instructions, no answer. Awaiting transport arrival from [facility]. Spoke with PTAR, PTAR endorses patient is "the fourth one in line and it will be a couple of more hours."No other needs identified. Will continue to monitor.

## 2021-02-10 NOTE — TOC Initial Note (Signed)
Transition of Care [facility]) - Initial/Assessment Note    Patient Details  Name: Larry Cordova MRN: [id number] Date of Birth: 1930/03/08  Transition of [facility]) CM/SW Contact:    Purcell Mouton, RN Phone Number: 02/10/2021, 12:58 PM  Clinical Narrative:                 Spoke with pt's wife who asked that daughter [name] be called. [facility] selected [facility] because that facility is close to her. [facility] do not service that area. [facility] was selected and referral was called into the referral line.   Expected Discharge Plan: [place] Barriers to Discharge: No Barriers Identified   Patient Goals and CMS Choice Patient states their goals for this hospitalization and ongoing recovery are:: No statement   Choice offered to / list presented to : Patient  Expected Discharge Plan and Services Expected Discharge Plan: [place]   Discharge Planning Services: CM Consult   Living arrangements for the past 2 months: Single Family Home Expected Discharge Date:  (unknown)                                    Prior Living Arrangements/Services Living arrangements for the past 2 months: Single Family Home Lives with:: Spouse Patient language and need for interpreter reviewed:: No Do you feel safe going back to the place where you live?: Yes        Care giver support system in place?: Yes (comment) Current home services: Home PT,Home RN ([facility])    Activities of Daily Living Home Assistive Devices/Equipment: Hospital bed,Wheelchair,Other (Comment) (foley catheter, seat cushion for wheelchair) ADL Screening (condition at time of admission) Patient's cognitive ability adequate to safely complete daily activities?: No Is the patient deaf or have difficulty hearing?: Yes Does the patient have difficulty seeing, even when wearing glasses/contacts?: No Does the patient have difficulty concentrating, remembering, or making  decisions?: Yes Patient able to express need for assistance with ADLs?: Yes Does the patient have difficulty dressing or bathing?: Yes Independently performs ADLs?: No Communication: Independent Dressing (OT): Dependent Is this a change from baseline?: Pre-admission baseline Grooming: Dependent Is this a change from baseline?: Pre-admission baseline Feeding: Dependent Is this a change from baseline?: Pre-admission baseline Bathing: Dependent Is this a change from baseline?: Pre-admission baseline Toileting: Dependent Is this a change from baseline?: Pre-admission baseline In/Out Bed: Dependent Is this a change from baseline?: Pre-admission baseline Walks in Home: Dependent Is this a change from baseline?: Pre-admission baseline Does the patient have difficulty walking or climbing stairs?: Yes (secondary to weakness) Weakness of Legs: Both Weakness of Arms/Hands: Both  Permission Sought/Granted Permission sought to share information with : Case Manager                Emotional Assessment Appearance:: Appears stated age Attitude/Demeanor/Rapport: Unable to Assess Affect (typically observed): Accepting Orientation: : Oriented to Self Alcohol / Substance Use: Not Applicable Psych Involvement: No (comment)  Admission diagnosis:  Sepsis ([place]) [A41.9] Sepsis with encephalopathy without septic shock, due to unspecified organism ([place]) [A41.9, R65.20, G93.40] Patient Active Problem List   Diagnosis Date Noted  . Sepsis ([place]) 02/03/2021  . Pressure injury of skin 02/03/2021  . Elevated troponin level not due myocardial infarction 01/19/2021  . COVID-19 virus infection 01/19/2021  . AKI (acute kidney injury) ([place]) 01/18/2021  . Leukocytosis 01/18/2021  . Dehydration with hyponatremia 01/18/2021  . Acute urinary  retention 01/18/2021  . Lactic acidosis 01/18/2021  . PAD (peripheral artery disease) ([place]) 08/18/2019  . Dementia with behavioral disturbance ([place]) 05/18/2019  .  Spondylolisthesis 03/18/2017  . Frontotemporal dementia ([place]) 01/03/2017  . Skin cancer 07/29/2014  . Other and unspecified hyperlipidemia 07/29/2014  . Arthritis 07/13/2013  . Hypothyroidism 06/16/2012  . HTN (hypertension) 06/16/2012   PCP:  [name], MD Pharmacy:   [facility], [address] [place] [address] Phone: [phone] Fax: [phone]  [id number] - [place], [place] - [address] [place] [address] Phone: [phone] Fax: [phone]     Social Determinants of Health (SDOH) Interventions    Readmission Risk Interventions No flowsheet data found.

## 2021-02-10 NOTE — Progress Notes (Signed)
Patient up and more alert. Alert to self, RR even and unlabored, given PO medicines crushed in apple sauce, with sips of thickened liquid. Rectal tube, and foley in place, clean dry and intact. No other needs identified. Will continue to monitor.

## 2021-02-10 NOTE — Telephone Encounter (Signed)
Please advise on verbal order to be patient's attending physician. Albina Billet form [facility] need an answer before Monday 02/13/21

## 2021-02-12 NOTE — Telephone Encounter (Signed)
Discussed with Hospice on Friday. I will serve as attending physician with pain regimen to be adjusted by hospice physician if needed.

## 2021-02-21 DIAGNOSIS — I6930 Unspecified sequelae of cerebral infarction: Secondary | ICD-10-CM | POA: Diagnosis not present

## 2021-03-14 ENCOUNTER — Telehealth: Payer: Self-pay | Admitting: Family Medicine

## 2021-03-14 NOTE — Telephone Encounter (Signed)
Order placed in Dr. Mancel Bale bin up front

## 2021-03-15 NOTE — Telephone Encounter (Signed)
Please Advise

## 2021-03-22 ENCOUNTER — Telehealth: Payer: Self-pay | Admitting: Family Medicine

## 2021-03-22 NOTE — Telephone Encounter (Signed)
..  Home Health Certification or Plan of Care Tracking  Is this a Certification or Plan of [place]:  [place] Order Number:  [id number] Has charge sheet been attached? Yes  Where has form been placed:  Placed in Dr. Vonna Kotyk bin up front

## 2021-03-24 DIAGNOSIS — I6930 Unspecified sequelae of cerebral infarction: Secondary | ICD-10-CM | POA: Diagnosis not present

## 2021-04-09 DEATH — deceased

## 2021-04-20 ENCOUNTER — Ambulatory Visit: Payer: Medicare Other | Admitting: Family Medicine

## 2022-12-28 IMAGING — CT CT MAXILLOFACIAL W/O CM
3 series · 14 of 47 positions shown, 16 images · non-contrast
Comparison: Head CT 12/17/2016.

CLINICAL DATA: Head trauma, moderate/severe. Facial trauma. Neck
trauma. Additional history provided: Patient presents with
weakness/malaise, history of dementia with increased falls recently,
3 falls on [REDACTED].

EXAM:
CT HEAD WITHOUT CONTRAST
CT MAXILLOFACIAL WITHOUT CONTRAST
CT CERVICAL SPINE WITHOUT CONTRAST
TECHNIQUE: Multidetector CT imaging of the head, cervical spine, and
maxillofacial structures were performed using the standard protocol
without intravenous contrast. Multiplanar CT image reconstructions
of the cervical spine and maxillofacial structures were also
generated.

[Series 3: max soft · axial · 0.39mm/px · z∈[-214,-42]mm · 8 of 100 slices shown, 10 images]
[im 7/100  brain]
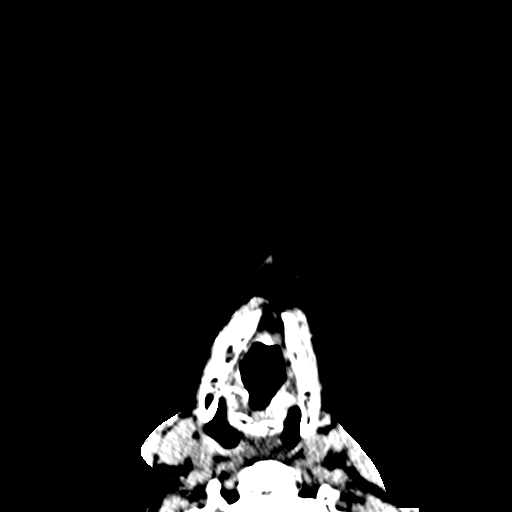
[im 7/100  bone]
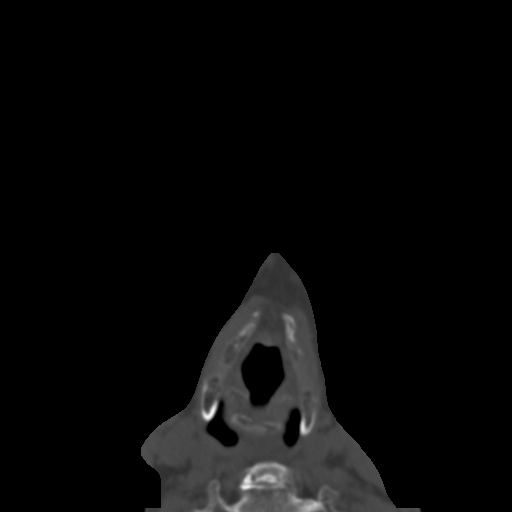
[im 21/100  bone]
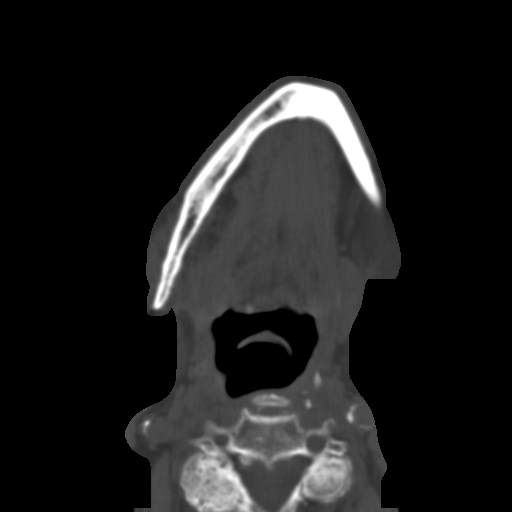
[im 31/100  bone]
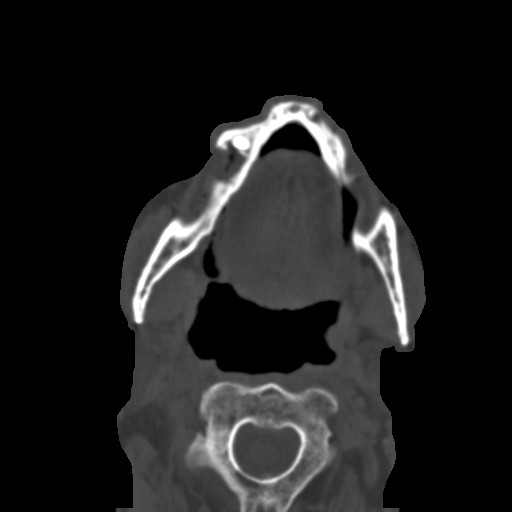
[im 45/100  bone]
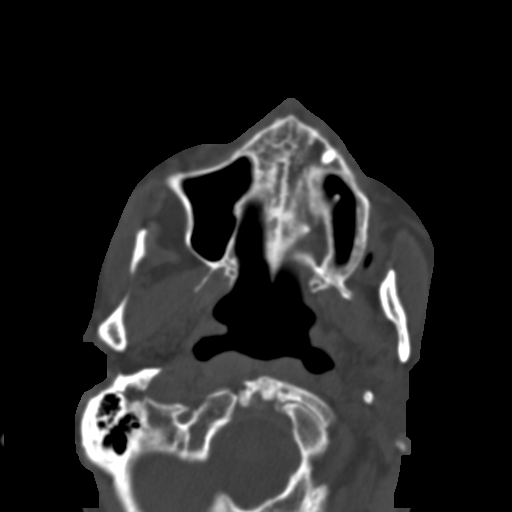
[im 55/100  brain]
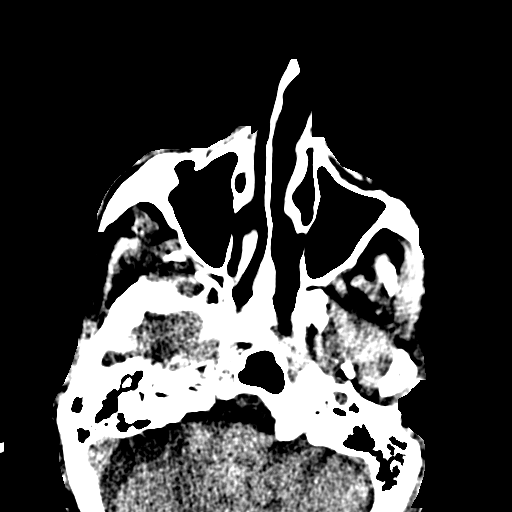
[im 55/100  bone]
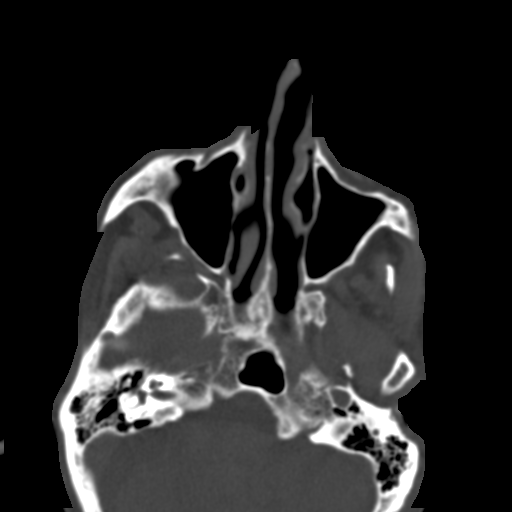
[im 69/100  bone]
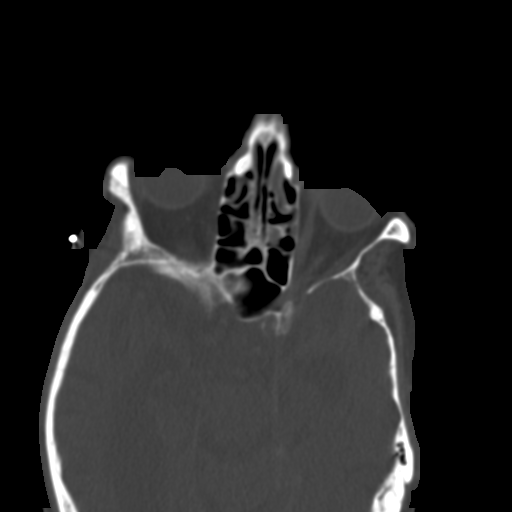
[im 79/100  bone]
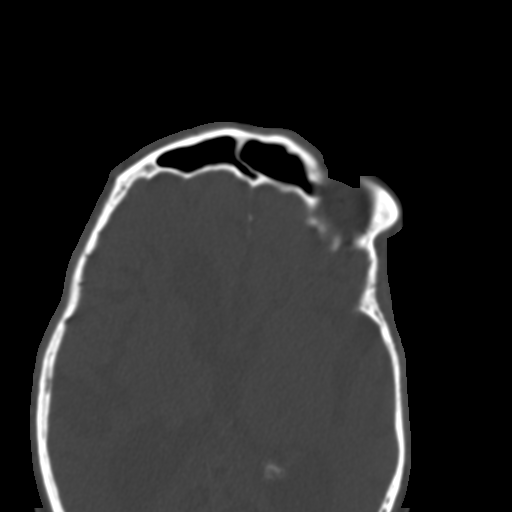
[im 93/100  bone]
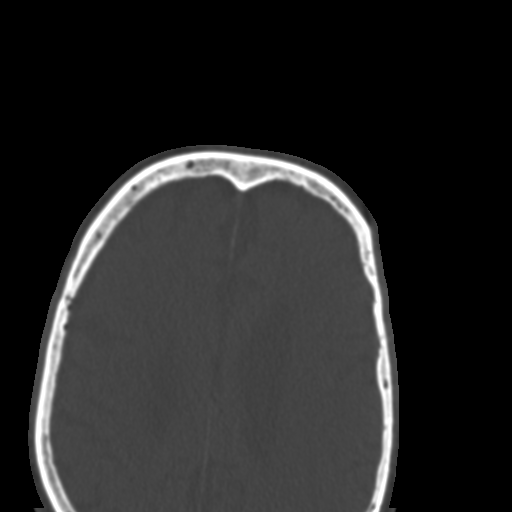

[Series 7: coronal soft · coronal · 0.40mm/px · 3 of 93 slices shown]
[im 31/93  bone]
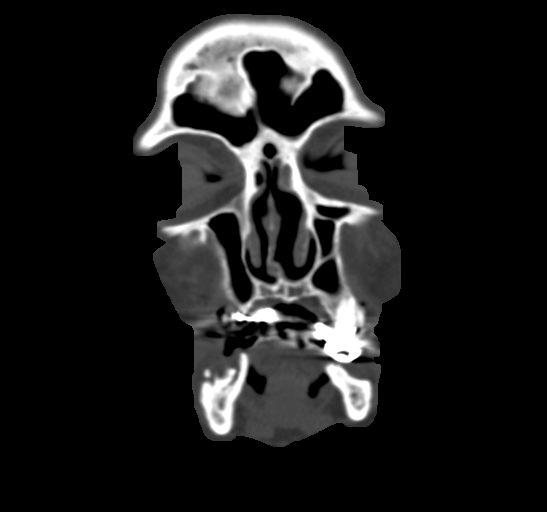
[im 41/93  bone]
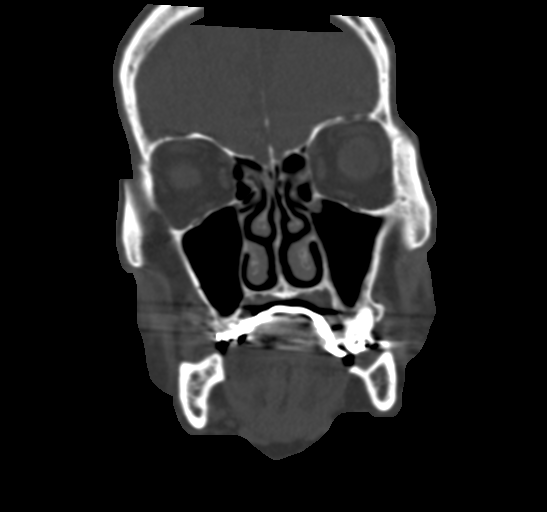
[im 52/93  bone]
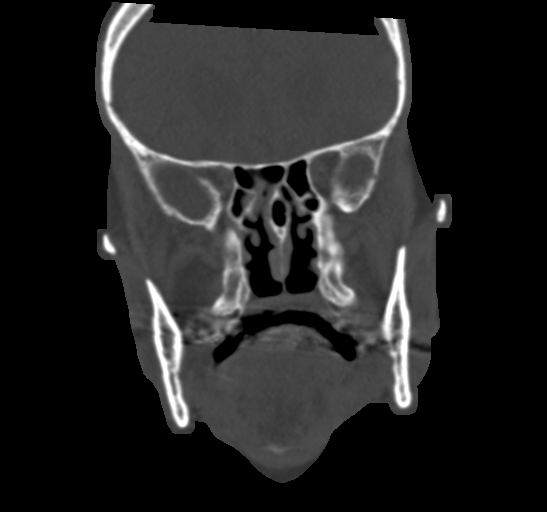

[Series 8: sagittal soft · sagittal · 0.36mm/px · 3 of 107 slices shown]
[im 36/107  bone]
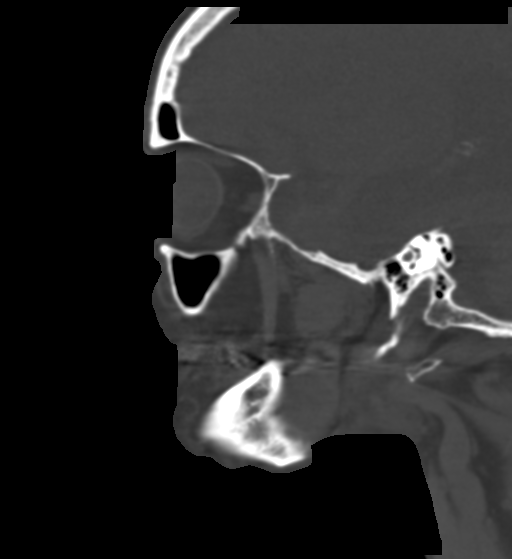
[im 54/107  bone]
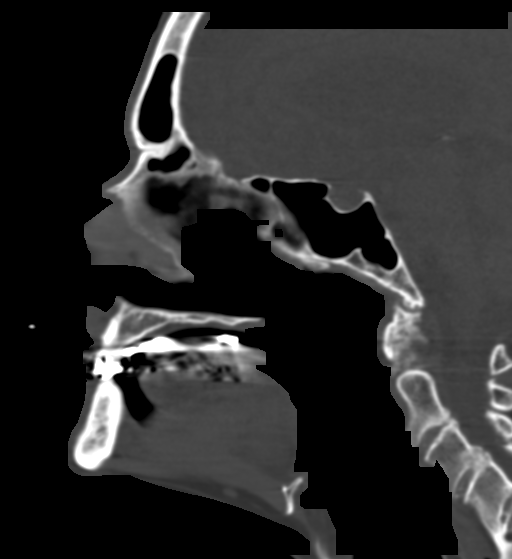
[im 71/107  bone]
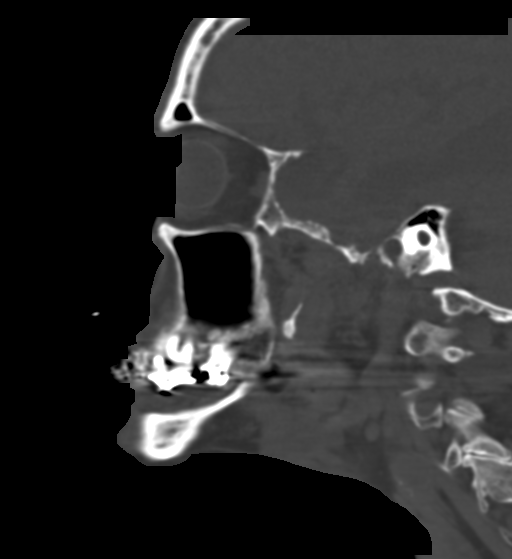

[14 of 47 positions shown; findings below may reference images not displayed]

FINDINGS: CT HEAD FINDINGS

Brain:

Mildly motion degraded exam.

Moderate to moderately advanced cerebral atrophy. Comparatively mild
cerebellar atrophy.

Lateral and third ventriculomegaly, which may be due to from
predominant atrophy.

Mild patchy and ill-defined hypoattenuation within the cerebral
white matter is nonspecific, but compatible with chronic small
vessel ischemic disease.

There is no acute intracranial hemorrhage.

No demarcated cortical infarct.

No extra-axial fluid collection.

No evidence of intracranial mass.

No midline shift.

Vascular: No hyperdense vessel.

Skull: No hyperdense vessel. Atherosclerotic calcifications.

Other: Possible small left occipital scalp hematoma.

CT MAXILLOFACIAL FINDINGS

Osseous: No acute maxillofacial fracture is identified.

Orbits: No acute finding. The globes are normal in size and contour.
The extraocular muscles and optic nerve sheath complexes are
symmetric and unremarkable.

Sinuses: Mild paranasal sinus mucosal thickening, most notably
ethmoidal.

Soft tissues: No maxillofacial soft tissue swelling appreciable by
CT.

Other: Poor dentition with multiple absent and carious teeth and
multifocal periapical lucencies.

CT CERVICAL SPINE FINDINGS

Alignment: Straightening of the expected cervical lordosis. Trace
fused C4-C5 grade 1 anterolisthesis. 3 mm C7-T1 grade 1
anterolisthesis.

Skull base and vertebrae: The basion-dental and atlanto-dental
intervals are maintained.No evidence of acute fracture to the
cervical spine.

Soft tissues and spinal canal: No prevertebral fluid or swelling. No
visible canal hematoma.

Disc levels: Cervical spondylosis with advanced multilevel disc
space narrowing, disc bulges, posterior disc osteophytes,
uncovertebral hypertrophy and facet arthrosis. Fusion across the
C4-C5 and C6-C7 disc spaces. There may also be early osseous fusion
across the C7-T1 disc space. Multilevel bridging ventral
osteophytes. Facet joint ankylosis on the left at C4-C5 and
bilaterally at C6-C7. Degenerative changes are also present at the
C1-C2 articulation.

Upper chest: No consolidation within the imaged lung apices. No
visible pneumothorax.
IMPRESSION: CT head:

1. Mildly motion degraded exam.
2. No acute posttraumatic intracranial findings.
3. Possible small left occipital scalp hematoma.
4. Moderate to moderately advanced cerebral atrophy, progressed as
compared to the head CT of 12/27/2016.
[DATE]. Lateral and third ventriculomegaly has also progressed. This may
be due to central predominant atrophy. A component of
communicating/normal pressure hydrocephalus is difficult to exclude.
6. Stable mild cerebral white matter chronic small vessel ischemic
disease.

CT maxillofacial:

1. No evidence of acute maxillofacial fracture.
2. Mild paranasal sinus mucosal thickening, most notably ethmoidal.

CT cervical spine:

1. No evidence of acute fracture to the cervical spine.
2. 3 mm C7-T1 grade 1 anterolisthesis.
3. Advanced cervical spondylosis as described with multilevel
degenerative fusion.
